# Patient Record
Sex: Female | Born: 1955 | Hispanic: Yes | Marital: Married | Smoking: Never smoker
Health system: Southern US, Community
[De-identification: ages and names within clinical notes are randomized; demographics above are authoritative.]

## PROBLEM LIST (undated history)

## (undated) DIAGNOSIS — E119 Type 2 diabetes mellitus without complications: Secondary | ICD-10-CM

## (undated) DIAGNOSIS — M1990 Unspecified osteoarthritis, unspecified site: Secondary | ICD-10-CM

## (undated) DIAGNOSIS — I10 Essential (primary) hypertension: Secondary | ICD-10-CM

## (undated) DIAGNOSIS — I639 Cerebral infarction, unspecified: Secondary | ICD-10-CM

## (undated) HISTORY — PX: ABDOMINAL HYSTERECTOMY: SHX81

## (undated) HISTORY — PX: APPENDECTOMY: SHX54

---

## 2003-12-16 HISTORY — PX: OTHER SURGICAL HISTORY: SHX169

## 2014-01-01 ENCOUNTER — Encounter (HOSPITAL_COMMUNITY): Payer: Self-pay | Admitting: Emergency Medicine

## 2014-01-01 ENCOUNTER — Emergency Department (HOSPITAL_COMMUNITY)
Admission: EM | Admit: 2014-01-01 | Discharge: 2014-01-01 | Disposition: A | Discharge status: Home / Self Care | Payer: Self-pay | Attending: Emergency Medicine | Admitting: Emergency Medicine

## 2014-01-01 DIAGNOSIS — E119 Type 2 diabetes mellitus without complications: Secondary | ICD-10-CM | POA: Insufficient documentation

## 2014-01-01 DIAGNOSIS — Z794 Long term (current) use of insulin: Secondary | ICD-10-CM | POA: Insufficient documentation

## 2014-01-01 DIAGNOSIS — Z79899 Other long term (current) drug therapy: Secondary | ICD-10-CM | POA: Insufficient documentation

## 2014-01-01 DIAGNOSIS — I10 Essential (primary) hypertension: Secondary | ICD-10-CM | POA: Insufficient documentation

## 2014-01-01 DIAGNOSIS — Z760 Encounter for issue of repeat prescription: Secondary | ICD-10-CM | POA: Insufficient documentation

## 2014-01-01 HISTORY — DX: Essential (primary) hypertension: I10

## 2014-01-01 HISTORY — DX: Type 2 diabetes mellitus without complications: E11.9

## 2014-01-01 LAB — GLUCOSE, CAPILLARY: Glucose-Capillary: 237 mg/dL — ABNORMAL HIGH (ref 70–99)

## 2014-01-01 MED ORDER — GLIPIZIDE-METFORMIN HCL 5-500 MG PO TABS: 1.0000 | ORAL_TABLET | Freq: Two times a day (BID) | ORAL | Status: DC

## 2014-01-01 NOTE — ED Notes (Signed)
Pt reports that she is here on vacation from [place] and ran out of her diabetic medication. No other complaints at this time. States she just needs a refill.

## 2014-01-01 NOTE — ED Provider Notes (Signed)
CSN: [id number]     Arrival date & time 01/01/14  1342 History  This chart was scribed for non-physician practitioner, Noland Fordyce, PA-C working with No att. providers found by Frederich Balding, ED scribe. This patient was seen in room TR08C/TR08C and the patient's care was started at 3:37 PM.    Chief Complaint  Patient presents with  . Medication Refill   The history is provided by the patient. A language interpreter was used.   HPI Comments: Cheryl Massey is a 58 y.o. female who presents to the Emergency Department for medication refill. She states she is here on vacation from [place] and ran out of her metformin 500 mg/5 mg. Denies any current complaints.   Past Medical History  Diagnosis Date  . Diabetes mellitus without complication   . Hypertension    History reviewed. No pertinent past surgical history. History reviewed. No pertinent family history. History  Substance Use Topics  . Smoking status: Never Smoker   . Smokeless tobacco: Not on file  . Alcohol Use: No   OB History   Grav Para Term Preterm Abortions TAB SAB Ect Mult Living                 Review of Systems  All other systems reviewed and are negative.   Allergies  Review of patient's allergies indicates no known allergies.  Home Medications   Current Outpatient Rx  Name  Route  Sig  Dispense  Refill  . enalapril (VASOTEC) 10 MG tablet   Oral   Take 10 mg by mouth daily.         Marland Kitchen glipiZIDE-metformin (METAGLIP) 5-500 MG per tablet   Oral   Take 1 tablet by mouth 2 (two) times daily before a meal.   30 tablet   0     BP 146/74  Pulse 64  Temp(Src) 98.1 F (36.7 C) (Oral)  Resp 20  Wt 177 lb (80.287 kg)  SpO2 100%  Physical Exam  Nursing note and vitals reviewed. Constitutional: She appears well-developed and well-nourished. No distress.  HENT:  Head: Normocephalic and atraumatic.  Eyes: Conjunctivae are normal. No scleral icterus.  Neck: Normal range of motion.   Cardiovascular: Normal rate.   Pulmonary/Chest: Effort normal. No respiratory distress.  Musculoskeletal: Normal range of motion.  Neurological: She is alert.  Skin: Skin is warm and dry. She is not diaphoretic.  Psychiatric: She has a normal mood and affect. Her behavior is normal.    ED Course  Procedures (including critical care time)  DIAGNOSTIC STUDIES: Oxygen Saturation is 100% on RA, normal by my interpretation.    COORDINATION OF CARE: 3:38 PM-Discussed treatment plan which includes refilling medication with pt at bedside and pt agreed to plan.   Labs Review Labs Reviewed  GLUCOSE, CAPILLARY - Abnormal; Notable for the following:    Glucose-Capillary 237 (*)    All other components within normal limits   Imaging Review No results found.  EKG Interpretation   None       MDM   1. Medication refill     I personally performed the services described in this documentation, which was scribed in my presence. The recorded information has been reviewed and is accurate.   Noland Fordyce, PA-C 01/01/14 [phone]

## 2014-01-01 NOTE — Discharge Instructions (Signed)
Entrega de recetas - Departamento de Emergencias  (Medication Refill, Emergency Department)  Hoy le hemos entregado una receta como cortesía. Para su mejor atención, deberá concurrir al consultorio de su médico de cabecera para obtener nuevas recetas. Ellos tienen los registros y pueden hacer un mejor seguimiento que en el departamento de emergencias.  Aquí sólo se prescribe la cantidad necesaria hasta que consulte a su médico. Este es un modo más costoso.  En el futuro planifique las consultas de modo que no tenga que concurrir al departamento de emergencias por este motivo.  Gracias por su colaboración. Su ayuda nos permite una mejor atención de las emergencias que ingresan a diario a nuestro departamento-  Document Released: 03/09/2008 Document Revised: 02/23/2012  ExitCare® Patient Information ©2014 [employer].

## 2014-01-02 NOTE — ED Provider Notes (Signed)
Medical screening examination/treatment/procedure(s) were performed by non-physician practitioner and as supervising physician I was immediately available for consultation/collaboration.  EKG Interpretation   None         Alfonzo Feller, DO 01/02/14 1144

## 2016-10-27 ENCOUNTER — Inpatient Hospital Stay (HOSPITAL_COMMUNITY)
Admission: AD | Admit: 2016-10-27 | Discharge: 2016-10-27 | Disposition: A | Discharge status: Home / Self Care | Payer: Self-pay | Source: Ambulatory Visit | Attending: Emergency Medicine | Admitting: Emergency Medicine

## 2016-10-27 ENCOUNTER — Encounter (HOSPITAL_COMMUNITY): Payer: Self-pay | Admitting: *Deleted

## 2016-10-27 ENCOUNTER — Inpatient Hospital Stay (HOSPITAL_COMMUNITY): Payer: Self-pay

## 2016-10-27 DIAGNOSIS — E119 Type 2 diabetes mellitus without complications: Secondary | ICD-10-CM | POA: Insufficient documentation

## 2016-10-27 DIAGNOSIS — I10 Essential (primary) hypertension: Secondary | ICD-10-CM | POA: Insufficient documentation

## 2016-10-27 DIAGNOSIS — Z79899 Other long term (current) drug therapy: Secondary | ICD-10-CM | POA: Insufficient documentation

## 2016-10-27 DIAGNOSIS — R0789 Other chest pain: Secondary | ICD-10-CM | POA: Insufficient documentation

## 2016-10-27 DIAGNOSIS — Z7984 Long term (current) use of oral hypoglycemic drugs: Secondary | ICD-10-CM | POA: Insufficient documentation

## 2016-10-27 LAB — COMPREHENSIVE METABOLIC PANEL
ALT: 25 U/L (ref 14–54)
AST: 23 U/L (ref 15–41)
Albumin: 4 g/dL (ref 3.5–5.0)
Alkaline Phosphatase: 65 U/L (ref 38–126)
Anion gap: 8 (ref 5–15)
BUN: 13 mg/dL (ref 6–20)
CHLORIDE: 105 mmol/L (ref 101–111)
CO2: 26 mmol/L (ref 22–32)
Calcium: 9.6 mg/dL (ref 8.9–10.3)
Creatinine, Ser: 0.81 mg/dL (ref 0.44–1.00)
GFR calc Af Amer: >60 mL/min (ref >60)
GFR calc non Af Amer: >60 mL/min (ref >60)
GLUCOSE: 120 mg/dL — AB (ref 65–99)
POTASSIUM: 4.2 mmol/L (ref 3.5–5.1)
Sodium: 139 mmol/L (ref 135–145)
Total Bilirubin: 0.6 mg/dL (ref 0.3–1.2)
Total Protein: 6.7 g/dL (ref 6.5–8.1)

## 2016-10-27 LAB — CBC WITH DIFFERENTIAL/PLATELET
Basophils Absolute: 0 10*3/uL (ref 0.0–0.1)
Basophils Relative: 0 %
Eosinophils Absolute: 0.1 10*3/uL (ref 0.0–0.7)
Eosinophils Relative: 1 %
HCT: 37.2 % (ref 36.0–46.0)
HEMOGLOBIN: 12.6 g/dL (ref 12.0–15.0)
LYMPHS ABS: 2.1 10*3/uL (ref 0.7–4.0)
Lymphocytes Relative: 31 %
MCH: 29.6 pg (ref 26.0–34.0)
MCHC: 33.9 g/dL (ref 30.0–36.0)
MCV: 87.3 fL (ref 78.0–100.0)
MONOS PCT: 7 %
Monocytes Absolute: 0.5 10*3/uL (ref 0.1–1.0)
NEUTROS ABS: 4 10*3/uL (ref 1.7–7.7)
NEUTROS PCT: 61 %
Platelets: 250 10*3/uL (ref 150–400)
RBC: 4.26 MIL/uL (ref 3.87–5.11)
RDW: 12.5 % (ref 11.5–15.5)
WBC: 6.6 10*3/uL (ref 4.0–10.5)

## 2016-10-27 LAB — LIPASE, BLOOD: Lipase: 48 U/L (ref 11–51)

## 2016-10-27 LAB — TROPONIN I: Troponin I: <0.03 ng/mL (ref <0.03)

## 2016-10-27 MED ORDER — GI COCKTAIL ~~LOC~~
30.0000 mL | Freq: Once | ORAL | Status: AC
  Administered 2016-10-27: 30 mL via ORAL
  Filled 2016-10-27: qty 30

## 2016-10-27 MED ORDER — NITROGLYCERIN 0.4 MG SL SUBL: 0.4000 mg | SUBLINGUAL_TABLET | SUBLINGUAL | Status: DC | PRN

## 2016-10-27 MED ORDER — OMEPRAZOLE 20 MG PO CPDR: 20.0000 mg | DELAYED_RELEASE_CAPSULE | Freq: Every day | ORAL | 0 refills | Status: DC

## 2016-10-27 MED ORDER — SODIUM CHLORIDE 0.9 % IV SOLN
999.0000 mL | INTRAVENOUS | Status: DC
  Administered 2016-10-27: 1000 mL via INTRAVENOUS

## 2016-10-27 MED ORDER — CLOPIDOGREL BISULFATE 75 MG PO TABS: 300.0000 mg | ORAL_TABLET | Freq: Once | ORAL | Status: DC

## 2016-10-27 MED ORDER — ASPIRIN 81 MG PO CHEW
324.0000 mg | CHEWABLE_TABLET | Freq: Once | ORAL | Status: AC
  Administered 2016-10-27: 324 mg via ORAL
  Filled 2016-10-27: qty 4

## 2016-10-27 NOTE — MAU Note (Signed)
EKG in progress

## 2016-10-27 NOTE — ED Provider Notes (Signed)
[facility] DEPT Provider Note   CSN: [id number] Arrival date & time: 10/27/16  1351     History   Chief Complaint Chief Complaint  Patient presents with  . Chest Pain  . Abdominal Pain    HPI Cheryl Massey is a 60 y.o. female.  HPI  Patient presents with concern of epigastric, mid chest pain. Symptoms began 3 days ago, without clear precipitant. Since onset she has had episodic chest pain, with occasional radiation to the left arm. Pain seems inconsistent, but she does state that it is worse with eating. Pain is somewhat better with doubling over, but no other medications or interventions have been attempted. No fever, no vomiting or diarrhea. Patient does complain of nausea. She has history of diabetes, hypertension, take all medication as directed. She has no history of heart disease. She does not smoke, does not drink.   Past Medical History:  Diagnosis Date  . Diabetes mellitus without complication ([place])   . Hypertension     There are no active problems to display for this patient.   Past Surgical History:  Procedure Laterality Date  . ABDOMINAL HYSTERECTOMY    . APPENDECTOMY      OB History    Gravida Para Term Preterm AB Living   1 1       1    SAB TAB Ectopic Multiple Live Births                   Home Medications    Prior to Admission medications   Medication Sig Start Date End Date Taking? Authorizing Provider  enalapril (VASOTEC) 10 MG tablet Take 10 mg by mouth daily.    Historical Provider, MD  glipiZIDE-metformin (METAGLIP) 5-500 MG per tablet Take 1 tablet by mouth 2 (two) times daily before a meal. 01/01/14   Noland Fordyce, PA-C    Family History History reviewed. No pertinent family history.  Social History Social History  Substance Use Topics  . Smoking status: Never Smoker  . Smokeless tobacco: Never Used  . Alcohol use No     Allergies   Patient has no known allergies.   Review of Systems Review  of Systems  Constitutional:       Per HPI, otherwise negative  HENT:       Per HPI, otherwise negative  Respiratory:       Per HPI, otherwise negative  Cardiovascular:       Per HPI, otherwise negative  Gastrointestinal: Positive for nausea. Negative for vomiting.  Endocrine:       Negative aside from HPI  Genitourinary:       Neg aside from HPI   Musculoskeletal:       Per HPI, otherwise negative  Skin: Negative.   Neurological: Negative for syncope.     Physical Exam Updated Vital Signs BP 167/82 (BP Location: Right Arm)   Pulse (!) 54   Temp 98.1 F (36.7 C) (Oral)   Resp 13   Wt 162 lb (73.5 kg)   SpO2 100%   Physical Exam  Constitutional: She is oriented to person, place, and time. She appears well-developed and well-nourished. No distress.  HENT:  Head: Normocephalic and atraumatic.  Eyes: Conjunctivae and EOM are normal.  Cardiovascular: Normal rate and regular rhythm.   Pulmonary/Chest: Effort normal and breath sounds normal. No stridor. No respiratory distress.  Abdominal: She exhibits no distension.  No ttp, no guarding  Musculoskeletal: She exhibits no edema.  Neurological: She is alert  and oriented to person, place, and time. No cranial nerve deficit.  Skin: Skin is warm and dry.  Psychiatric: She has a normal mood and affect.  Nursing note and vitals reviewed.    ED Treatments / Results  Labs (all labs ordered are listed, but only abnormal results are displayed) Labs Reviewed  COMPREHENSIVE METABOLIC PANEL  LIPASE, BLOOD  TROPONIN I  CBC WITH DIFFERENTIAL/PLATELET    EKG: SR, rate 56, minimal artefact, otherwise normal.  Radiology No results found.  Procedures Procedures (including critical care time)  Medications Ordered in ED Medications  nitroGLYCERIN (NITROSTAT) SL tablet 0.4 mg (not administered)  0.9 %  sodium chloride infusion (1,000 mLs Intravenous New Bag/Given 10/27/16 1605)  gi cocktail (Maalox,Lidocaine,Donnatal) (not  administered)  aspirin chewable tablet 324 mg (324 mg Oral Given 10/27/16 1516)     Initial Impression / Assessment and Plan / ED Course  I have reviewed the triage vital signs and the nursing notes.  Pertinent labs & imaging results that were available during my care of the patient were reviewed by me and considered in my medical decision making (see chart for details).  Clinical Course    6:09 PM Patient awake, alert, no complaints, aware of all findings. We discussed the importance of taking the medication as prescribed, following up with her physician within one week.   Final Clinical Impressions(s) / ED Diagnoses  Patient presents with chest pain. Patient was transferred from our affiliated [facility], due to these concerns. Here she is awake, alert, in no distress. Patient has 3 days of intermittent chest pain, seemingly worse after eating, and given the reassuring lab results, unremarkable EKG, troponin, there is low suspicion for occult ACS or other acute life-threatening pathology. Given the description of pain associated with eating, the patient was started on medication for gastric etiology, discharged to follow-up with primary care.    Carmin Muskrat, MD 10/27/16 [phone]

## 2016-10-27 NOTE — MAU Note (Signed)
[facility] to transport this pt. To [facility]. 67yr. Old chest pain.

## 2016-10-27 NOTE — MAU Note (Signed)
Patient transferred to [facility] via EMS in stable condition.

## 2016-10-27 NOTE — ED Notes (Signed)
Lab at bedside

## 2016-10-27 NOTE — MAU Note (Signed)
Pt C/O upper abd pain, also L side chest pain that goes down her L arm x 3 days.  Is very nauseated after eating, has not been vomiting.

## 2016-10-27 NOTE — ED Notes (Signed)
Pt here from [facility] hospital because pt went to hospital for chest pain and pt is not pregnant.

## 2016-10-27 NOTE — MAU Provider Note (Signed)
History     CSN: [id number]  Arrival date and time: 10/27/16 1351   First Provider Initiated Contact with Patient 10/27/16 1450      Chief Complaint  Patient presents with  . Chest Pain  . Abdominal Pain   Cheryl Massey is a 60 y.o. Female who presents with epigastric pain & chest pain that radiates down left arm.    Chest Pain   This is a new problem. Episode onset: x 3 days. The problem occurs 2 to 4 times per day. The problem has been gradually worsening. Pain location: epigastric, left pectoral area  The pain is at a severity of 7/10. The quality of the pain is described as burning and dull. The pain radiates to the left arm. Associated symptoms include nausea. Pertinent negatives include no abdominal pain, diaphoresis, dizziness, irregular heartbeat, palpitations, shortness of breath, vomiting or weakness. The pain is aggravated by food. She has tried nothing for the symptoms. Risk factors include obesity.  Her past medical history is significant for diabetes and hypertension.  Pertinent negatives for past medical history include no CAD, no CHF and no MI.  Pertinent negatives for family medical history include: no sudden death. Prior diagnostic workup includes echocardiogram.   Spanish interpreter at bedside.     Past Medical History:  Diagnosis Date  . Diabetes mellitus without complication ([place])   . Hypertension     No past surgical history on file.  No family history on file.  Social History  Substance Use Topics  . Smoking status: Never Smoker  . Smokeless tobacco: Not on file  . Alcohol use No    Allergies: No Known Allergies  Prescriptions Prior to Admission  Medication Sig Dispense Refill Last Dose  . enalapril (VASOTEC) 10 MG tablet Take 10 mg by mouth daily.   01/01/2014 at Unknown time  . glipiZIDE-metformin (METAGLIP) 5-500 MG per tablet Take 1 tablet by mouth 2 (two) times daily before a meal. 30 tablet 0     Review of Systems   Constitutional: Negative for diaphoresis.  Respiratory: Negative for shortness of breath.   Cardiovascular: Positive for chest pain. Negative for palpitations.  Gastrointestinal: Positive for heartburn and nausea. Negative for abdominal pain and vomiting.  Neurological: Negative for dizziness and weakness.   Physical Exam   Blood pressure 157/73, pulse (!) 55, temperature 98.1 F (36.7 C), temperature source Oral, resp. rate 18, weight 162 lb (73.5 kg), SpO2 100 %.  Physical Exam  Nursing note and vitals reviewed. Constitutional: She is oriented to person, place, and time. She appears well-developed and well-nourished. No distress.  HENT:  Head: Normocephalic and atraumatic.  Eyes: Conjunctivae are normal. Right eye exhibits no discharge. Left eye exhibits no discharge. No scleral icterus.  Neck: Normal range of motion.  Cardiovascular: Regular rhythm and normal heart sounds.  Bradycardia present.   No murmur heard. Respiratory: Effort normal and breath sounds normal. No respiratory distress. She has no wheezes. She exhibits no tenderness.  GI: There is no tenderness.  Neurological: She is alert and oriented to person, place, and time.  Skin: Skin is warm and dry. She is not diaphoretic.  Psychiatric: She has a normal mood and affect. Her behavior is normal. Judgment and thought content normal.    MAU Course  Procedures No results found for this or any previous visit (from the past 24 hour(s)).  MDM EKG -- sinus bradycardia CP protocol initiated S/w Dr. Dolly Rias at [facility] who accepted transfer of patient  Aspirin 324 mg   Assessment and Plan  A: 1. Chest pain of uncertain etiology    P: Transfer to [facility] via [facility] 10/27/2016, 2:50 PM

## 2016-10-27 NOTE — ED Notes (Signed)
ED Provider at bedside. 

## 2016-10-27 NOTE — MAU Note (Signed)
Patient signed consent on paper.

## 2016-10-27 NOTE — ED Notes (Signed)
[facility] reporting epigastric pain that is somewhat worse when eating but not drinking. Pt denies diarrhea and fevers or being around any other sick contacts. Pt also reports intermittent left arm pain as well. Denies sob. resp e/u, nad.

## 2016-10-27 NOTE — MAU Note (Addendum)
Called [facility] to transport a 28yr. old pt. to [facility] and was told it would be 30mins /hr.

## 2018-01-17 ENCOUNTER — Encounter (HOSPITAL_COMMUNITY): Payer: Self-pay

## 2018-01-17 ENCOUNTER — Emergency Department (HOSPITAL_COMMUNITY): Payer: Self-pay

## 2018-01-17 ENCOUNTER — Emergency Department (HOSPITAL_COMMUNITY)
Admission: EM | Admit: 2018-01-17 | Discharge: 2018-01-17 | Disposition: A | Discharge status: Home / Self Care | Payer: Self-pay | Attending: Emergency Medicine | Admitting: Emergency Medicine

## 2018-01-17 DIAGNOSIS — R6889 Other general symptoms and signs: Secondary | ICD-10-CM | POA: Insufficient documentation

## 2018-01-17 DIAGNOSIS — E119 Type 2 diabetes mellitus without complications: Secondary | ICD-10-CM | POA: Insufficient documentation

## 2018-01-17 DIAGNOSIS — Z79899 Other long term (current) drug therapy: Secondary | ICD-10-CM | POA: Insufficient documentation

## 2018-01-17 DIAGNOSIS — I10 Essential (primary) hypertension: Secondary | ICD-10-CM | POA: Insufficient documentation

## 2018-01-17 LAB — CBG MONITORING, ED: Glucose-Capillary: 378 mg/dL — ABNORMAL HIGH (ref 65–99)

## 2018-01-17 LAB — INFLUENZA PANEL BY PCR (TYPE A & B)
INFLBPCR: NEGATIVE
Influenza A By PCR: POSITIVE — AB

## 2018-01-17 MED ORDER — GUAIFENESIN-CODEINE 100-10 MG/5ML PO SYRP: 5.0000 mL | ORAL_SOLUTION | Freq: Three times a day (TID) | ORAL | 0 refills | Status: DC | PRN

## 2018-01-17 NOTE — ED Provider Notes (Signed)
[facility] EMERGENCY DEPARTMENT Provider Note   CSN: [id number] Arrival date & time: 01/17/18  1245     History   Chief Complaint No chief complaint on file.   HPI [place] Cheryl Massey is a 62 y.o. female.  HPI   Patient is a 62 year old female with a history of hypertension and non-insulin-dependent diabetes mellitus who presents to the emergency department for flu test.  States she has a family member who is pregnant and was told by [facility] to be tested for the flu given her recent upper respiratory tract symptoms.  She states that for the past week she has had productive cough with white phlegm.  She also reports congestion and rhinorrhea.  Reports body aches in bilateral arms and legs.  Also endorses bitemporal aching headache.  She has tried taking over-the-counter TheraFlu for her symptoms with some relief.  She has several family members with similar symptoms.  Denies fever/chills, ear pain, sore throat, neck pain, chest pain, shortness of breath, abdominal pain, nausea/vomiting.  States that she has not been checking her blood sugar at home given her recent illness. Denies polydipsia, polyuria, fatigue.   Past Medical History:  Diagnosis Date  . Diabetes mellitus without complication ([place])   . Hypertension     There are no active problems to display for this patient.   Past Surgical History:  Procedure Laterality Date  . ABDOMINAL HYSTERECTOMY    . APPENDECTOMY      OB History    Gravida Para Term Preterm AB Living   1 1       1    SAB TAB Ectopic Multiple Live Births                   Home Medications    Prior to Admission medications   Medication Sig Start Date End Date Taking? Authorizing Provider  enalapril (VASOTEC) 10 MG tablet Take 10 mg by mouth daily.    [provider]  glipiZIDE-metformin (METAGLIP) 5-500 MG per tablet Take 1 tablet by mouth 2 (two) times daily before a meal. 01/01/14   Gerarda Fraction, Bronwen Betters,  PA-C  omeprazole (PRILOSEC) 20 MG capsule Take 1 capsule (20 mg total) by mouth daily. Take one tablet daily 10/27/16   Carmin Muskrat, MD    Family History No family history on file.  Social History Social History   Tobacco Use  . Smoking status: Never Smoker  . Smokeless tobacco: Never Used  Substance Use Topics  . Alcohol use: No  . Drug use: No     Allergies   Patient has no known allergies.   Review of Systems Review of Systems  Constitutional: Negative for chills, fatigue and fever.  HENT: Positive for congestion and rhinorrhea. Negative for ear pain, sore throat and trouble swallowing.   Respiratory: Positive for cough. Negative for shortness of breath.   Cardiovascular: Negative for chest pain.  Gastrointestinal: Negative for abdominal pain, nausea and vomiting.  Endocrine: Negative for polydipsia and polyuria.  Musculoskeletal: Positive for myalgias (total body aches). Negative for gait problem, neck pain and neck stiffness.  Skin: Negative for rash.  Neurological: Positive for headaches. Negative for weakness and numbness.  Psychiatric/Behavioral: Negative for agitation and confusion.     Physical Exam Updated Vital Signs BP (!) 147/81 (BP Location: Right Arm)   Pulse 85   Temp 98 F (36.7 C) (Oral)   Resp 12   SpO2 98%   Physical Exam  Constitutional: She appears  well-developed and well-nourished. No distress.  HENT:  Head: Normocephalic and atraumatic.  Mouth/Throat: Oropharynx is clear and moist. No oropharyngeal exudate.  Bilateral TMs with good cone of light.  Mucous membranes moist.  Eyes: Conjunctivae are normal. Pupils are equal, round, and reactive to light. Right eye exhibits no discharge. Left eye exhibits no discharge.  Neck: Normal range of motion. Neck supple.  Cardiovascular: Normal rate, regular rhythm and intact distal pulses. Exam reveals no friction rub.  No murmur heard. Pulmonary/Chest: Effort normal. No stridor. No respiratory  distress. She has no wheezes. She has no rales.  Abdominal: Soft. Bowel sounds are normal. There is no tenderness. There is no guarding.  Lymphadenopathy:    She has no cervical adenopathy.  Neurological: She is alert. Coordination normal.  Skin: Skin is warm and dry. She is not diaphoretic.  Psychiatric: She has a normal mood and affect. Her behavior is normal.  Nursing note and vitals reviewed.    ED Treatments / Results  Labs (all labs ordered are listed, but only abnormal results are displayed) Labs Reviewed  CBG MONITORING, ED - Abnormal; Notable for the following components:      Result Value   Glucose-Capillary 378 (*)    All other components within normal limits  INFLUENZA PANEL BY PCR (TYPE A & B)    EKG  EKG Interpretation None       Radiology Dg Chest 2 View  Result Date: 01/17/2018 CLINICAL DATA:  Cough and fever for 4 days. EXAM: CHEST  2 VIEW COMPARISON:  10/27/2016 FINDINGS: Cardiomediastinal silhouette is normal. Mediastinal contours appear intact. There is no evidence of focal airspace consolidation, pleural effusion or pneumothorax. Osseous structures are without acute abnormality. Soft tissues are grossly normal. IMPRESSION: No active cardiopulmonary disease. Electronically Signed   By: Fidela Salisbury M.D.   On: 01/17/2018 13:38    Procedures Procedures (including critical care time)  Medications Ordered in ED Medications - No data to display   Initial Impression / Assessment and Plan / ED Course  I have reviewed the triage vital signs and the nursing notes.  Pertinent labs & imaging results that were available during my care of the patient were reviewed by me and considered in my medical decision making (see chart for details).    Patient presents for flu test given she has a family member who is pregnant and she has had recent URI symptoms.  Her blood glucose is elevated, likely related to her recent illness. Counseled her to drink plenty of  fluids, continue taking her home glipizide-metformin and have this rechecked and managed with her PCP. Chest xray negative for pneumonia or acute abnormality. Vitals are stable, low-grade fever. No signs of dehydration, tolerating PO's. Lungs are clear. The patient understands that symptoms are greater than the recommended 24-48 hour window of Tamiflu treatment. Will proceed with influenza testing given [facility] has asked this to be done. Patient will be discharged with instructions to orally hydrate, rest, and use over-the-counter medications such as anti-inflammatories ibuprofen and Aleve for muscle aches and Tylenol for fever. Pt will be discharged with cough suppressant.  Discussed return precautions with patient and she agrees and voiced understanding at bedside.  Final Clinical Impressions(s) / ED Diagnoses   Final diagnoses:  Flu-like symptoms    ED Discharge Orders        Ordered    guaiFENesin-codeine (ROBITUSSIN AC) 100-10 MG/5ML syrup  3 times daily PRN     01/17/18 1553  Glyn Ade, PA-C 01/17/18 1555    Tanna Furry, MD 01/18/18 2157

## 2018-01-17 NOTE — Discharge Instructions (Signed)
You will receive a phone call if your test for flu is positive.  Your blood sugar is high.  Please check at home and follow-up with your regular doctor for further management.

## 2018-01-17 NOTE — ED Triage Notes (Signed)
Per interpretor services patient here with cold symptoms, cough and fever x 4-5 days. Patient speaking complete symptoms, NAD

## 2018-01-17 NOTE — ED Notes (Signed)
Called patient to be roomed, no answer, patient still at [employer]

## 2018-01-17 NOTE — ED Notes (Signed)
Declined W/C at D/C and was escorted to lobby by RN. 

## 2018-01-17 NOTE — ED Triage Notes (Signed)
PT reports entire family has been sent to ED by a Doctor at [facility] hospital because her Granddaughter is pregnant.

## 2018-09-13 ENCOUNTER — Ambulatory Visit: Payer: Self-pay

## 2019-03-28 ENCOUNTER — Emergency Department (HOSPITAL_COMMUNITY): Payer: Medicaid Other

## 2019-03-28 ENCOUNTER — Observation Stay (HOSPITAL_BASED_OUTPATIENT_CLINIC_OR_DEPARTMENT_OTHER): Payer: Medicaid Other

## 2019-03-28 ENCOUNTER — Observation Stay (HOSPITAL_COMMUNITY): Payer: Medicaid Other

## 2019-03-28 ENCOUNTER — Other Ambulatory Visit: Payer: Self-pay

## 2019-03-28 ENCOUNTER — Encounter (HOSPITAL_COMMUNITY): Payer: Self-pay | Admitting: Emergency Medicine

## 2019-03-28 ENCOUNTER — Inpatient Hospital Stay (HOSPITAL_COMMUNITY)
Admission: EM | Admit: 2019-03-28 | Discharge: 2019-03-29 | DRG: 066 | Disposition: A | Discharge status: Home / Self Care | Payer: Medicaid Other | Attending: Internal Medicine | Admitting: Internal Medicine

## 2019-03-28 DIAGNOSIS — G8929 Other chronic pain: Secondary | ICD-10-CM | POA: Diagnosis present

## 2019-03-28 DIAGNOSIS — I63512 Cerebral infarction due to unspecified occlusion or stenosis of left middle cerebral artery: Secondary | ICD-10-CM | POA: Diagnosis not present

## 2019-03-28 DIAGNOSIS — G459 Transient cerebral ischemic attack, unspecified: Secondary | ICD-10-CM | POA: Diagnosis present

## 2019-03-28 DIAGNOSIS — I10 Essential (primary) hypertension: Secondary | ICD-10-CM | POA: Diagnosis not present

## 2019-03-28 DIAGNOSIS — Z7984 Long term (current) use of oral hypoglycemic drugs: Secondary | ICD-10-CM

## 2019-03-28 DIAGNOSIS — Z6828 Body mass index (BMI) 28.0-28.9, adult: Secondary | ICD-10-CM

## 2019-03-28 DIAGNOSIS — E119 Type 2 diabetes mellitus without complications: Secondary | ICD-10-CM | POA: Diagnosis not present

## 2019-03-28 DIAGNOSIS — Z79899 Other long term (current) drug therapy: Secondary | ICD-10-CM

## 2019-03-28 DIAGNOSIS — D352 Benign neoplasm of pituitary gland: Secondary | ICD-10-CM | POA: Diagnosis not present

## 2019-03-28 DIAGNOSIS — E663 Overweight: Secondary | ICD-10-CM | POA: Diagnosis present

## 2019-03-28 DIAGNOSIS — R29700 NIHSS score 0: Secondary | ICD-10-CM | POA: Diagnosis present

## 2019-03-28 DIAGNOSIS — M25551 Pain in right hip: Secondary | ICD-10-CM | POA: Diagnosis not present

## 2019-03-28 DIAGNOSIS — I639 Cerebral infarction, unspecified: Secondary | ICD-10-CM

## 2019-03-28 DIAGNOSIS — Z8249 Family history of ischemic heart disease and other diseases of the circulatory system: Secondary | ICD-10-CM

## 2019-03-28 DIAGNOSIS — Z90710 Acquired absence of both cervix and uterus: Secondary | ICD-10-CM

## 2019-03-28 DIAGNOSIS — E785 Hyperlipidemia, unspecified: Secondary | ICD-10-CM | POA: Diagnosis present

## 2019-03-28 HISTORY — DX: Cerebral infarction, unspecified: I63.9

## 2019-03-28 LAB — CBC
HCT: 39.8 % (ref 36.0–46.0)
Hemoglobin: 13 g/dL (ref 12.0–15.0)
MCH: 28.9 pg (ref 26.0–34.0)
MCHC: 32.7 g/dL (ref 30.0–36.0)
MCV: 88.4 fL (ref 80.0–100.0)
Platelets: 293 10*3/uL (ref 150–400)
RBC: 4.5 MIL/uL (ref 3.87–5.11)
RDW: 12.4 % (ref 11.5–15.5)
WBC: 5.9 10*3/uL (ref 4.0–10.5)
nRBC: 0 % (ref 0.0–0.2)

## 2019-03-28 LAB — RAPID URINE DRUG SCREEN, HOSP PERFORMED
Amphetamines: NOT DETECTED
Barbiturates: NOT DETECTED
Benzodiazepines: NOT DETECTED
Cocaine: NOT DETECTED
Opiates: NOT DETECTED
Tetrahydrocannabinol: NOT DETECTED

## 2019-03-28 LAB — COMPREHENSIVE METABOLIC PANEL
ALT: 22 U/L (ref 0–44)
AST: 22 U/L (ref 15–41)
Albumin: 4 g/dL (ref 3.5–5.0)
Alkaline Phosphatase: 71 U/L (ref 38–126)
Anion gap: 12 (ref 5–15)
BUN: 12 mg/dL (ref 8–23)
CO2: 24 mmol/L (ref 22–32)
Calcium: 9.5 mg/dL (ref 8.9–10.3)
Chloride: 103 mmol/L (ref 98–111)
Creatinine, Ser: 0.81 mg/dL (ref 0.44–1.00)
GFR calc Af Amer: >60 mL/min (ref >60)
GFR calc non Af Amer: >60 mL/min (ref >60)
Glucose, Bld: 215 mg/dL — ABNORMAL HIGH (ref 70–99)
Potassium: 3.8 mmol/L (ref 3.5–5.1)
Sodium: 139 mmol/L (ref 135–145)
Total Bilirubin: 0.5 mg/dL (ref 0.3–1.2)
Total Protein: 7 g/dL (ref 6.5–8.1)

## 2019-03-28 LAB — URINALYSIS, ROUTINE W REFLEX MICROSCOPIC
Bilirubin Urine: NEGATIVE
Glucose, UA: 50 mg/dL — AB
Hgb urine dipstick: NEGATIVE
Ketones, ur: NEGATIVE mg/dL
Leukocytes,Ua: NEGATIVE
Nitrite: NEGATIVE
Protein, ur: NEGATIVE mg/dL
Specific Gravity, Urine: 1.024 (ref 1.005–1.030)
pH: 6 (ref 5.0–8.0)

## 2019-03-28 LAB — DIFFERENTIAL
Abs Immature Granulocytes: 0.03 10*3/uL (ref 0.00–0.07)
Basophils Absolute: 0.1 10*3/uL (ref 0.0–0.1)
Basophils Relative: 1 %
Eosinophils Absolute: 0.1 10*3/uL (ref 0.0–0.5)
Eosinophils Relative: 2 %
Immature Granulocytes: 1 %
Lymphocytes Relative: 31 %
Lymphs Abs: 1.8 10*3/uL (ref 0.7–4.0)
Monocytes Absolute: 0.5 10*3/uL (ref 0.1–1.0)
Monocytes Relative: 9 %
Neutro Abs: 3.3 10*3/uL (ref 1.7–7.7)
Neutrophils Relative %: 56 %

## 2019-03-28 LAB — APTT: aPTT: 27 seconds (ref 24–36)

## 2019-03-28 LAB — GLUCOSE, CAPILLARY
Glucose-Capillary: 91 mg/dL (ref 70–99)
Glucose-Capillary: 245 mg/dL — ABNORMAL HIGH (ref 70–99)

## 2019-03-28 LAB — I-STAT CREATININE, ED: Creatinine, Ser: 0.8 mg/dL (ref 0.44–1.00)

## 2019-03-28 LAB — PROTIME-INR
INR: 1 (ref 0.8–1.2)
Prothrombin Time: 12.8 seconds (ref 11.4–15.2)

## 2019-03-28 LAB — ECHOCARDIOGRAM LIMITED
Height: 63 in
Weight: 2567.92 oz

## 2019-03-28 LAB — ETHANOL: Alcohol, Ethyl (B): <10 mg/dL (ref <10)

## 2019-03-28 MED ORDER — ACETAMINOPHEN 160 MG/5ML PO SOLN: 650.0000 mg | ORAL | Status: DC | PRN

## 2019-03-28 MED ORDER — ATORVASTATIN CALCIUM 40 MG PO TABS
40.0000 mg | ORAL_TABLET | Freq: Every day | ORAL | Status: DC
  Administered 2019-03-28 – 2019-03-29 (×2): 40 mg via ORAL
  Filled 2019-03-28 (×2): qty 1

## 2019-03-28 MED ORDER — INSULIN ASPART 100 UNIT/ML ~~LOC~~ SOLN
0.0000 [IU] | Freq: Every day | SUBCUTANEOUS | Status: DC
  Administered 2019-03-28: 2 [IU] via SUBCUTANEOUS

## 2019-03-28 MED ORDER — ASPIRIN 300 MG RE SUPP: 300.0000 mg | Freq: Every day | RECTAL | Status: DC

## 2019-03-28 MED ORDER — SODIUM CHLORIDE 0.9 % IV SOLN
INTRAVENOUS | Status: DC
  Administered 2019-03-28: 18:00:00 via INTRAVENOUS

## 2019-03-28 MED ORDER — GLUCERNA SHAKE PO LIQD
237.0000 mL | Freq: Two times a day (BID) | ORAL | Status: DC
  Administered 2019-03-29 (×2): 237 mL via ORAL

## 2019-03-28 MED ORDER — LORAZEPAM 2 MG/ML IJ SOLN: 0.5000 mg | Freq: Once | INTRAMUSCULAR | Status: DC

## 2019-03-28 MED ORDER — STROKE: EARLY STAGES OF RECOVERY BOOK
Freq: Once | Status: AC
  Administered 2019-03-28: 18:00:00

## 2019-03-28 MED ORDER — INSULIN ASPART 100 UNIT/ML ~~LOC~~ SOLN
0.0000 [IU] | Freq: Three times a day (TID) | SUBCUTANEOUS | Status: DC
  Administered 2019-03-29: 2 [IU] via SUBCUTANEOUS

## 2019-03-28 MED ORDER — SENNOSIDES-DOCUSATE SODIUM 8.6-50 MG PO TABS: 1.0000 | ORAL_TABLET | Freq: Every evening | ORAL | Status: DC | PRN

## 2019-03-28 MED ORDER — ACETAMINOPHEN 650 MG RE SUPP: 650.0000 mg | RECTAL | Status: DC | PRN

## 2019-03-28 MED ORDER — ENOXAPARIN SODIUM 40 MG/0.4ML ~~LOC~~ SOLN
40.0000 mg | SUBCUTANEOUS | Status: DC
  Administered 2019-03-28: 40 mg via SUBCUTANEOUS
  Filled 2019-03-28: qty 0.4

## 2019-03-28 MED ORDER — ASPIRIN 325 MG PO TABS
325.0000 mg | ORAL_TABLET | Freq: Every day | ORAL | Status: DC
  Administered 2019-03-28 – 2019-03-29 (×2): 325 mg via ORAL
  Filled 2019-03-28 (×2): qty 1

## 2019-03-28 MED ORDER — IOHEXOL 350 MG/ML SOLN
50.0000 mL | Freq: Once | INTRAVENOUS | Status: AC | PRN
  Administered 2019-03-28: 50 mL via INTRAVENOUS

## 2019-03-28 MED ORDER — CLOPIDOGREL BISULFATE 75 MG PO TABS
300.0000 mg | ORAL_TABLET | Freq: Once | ORAL | Status: AC
  Administered 2019-03-29: 300 mg via ORAL
  Filled 2019-03-28: qty 4

## 2019-03-28 MED ORDER — ACETAMINOPHEN 325 MG PO TABS: 650.0000 mg | ORAL_TABLET | ORAL | Status: DC | PRN

## 2019-03-28 MED ORDER — CLOPIDOGREL BISULFATE 75 MG PO TABS
75.0000 mg | ORAL_TABLET | Freq: Every day | ORAL | Status: DC
  Administered 2019-03-29: 75 mg via ORAL
  Filled 2019-03-28: qty 1

## 2019-03-28 MED ORDER — GADOBUTROL 1 MMOL/ML IV SOLN
7.0000 mL | Freq: Once | INTRAVENOUS | Status: AC | PRN
  Administered 2019-03-28: 7 mL via INTRAVENOUS

## 2019-03-28 NOTE — Consult Note (Signed)
Neurology Consultation  Reason for Consult: Code stroke Referring Physician: Lita Mains  CC: Right-sided weakness  History is obtained from: Patient  HPI: Cheryl Massey is a 63 y.o. female with history of diabetes and hypertension.  Patient states that she got up this morning feeling fine.  She went to make a cup of coffee at approximately 9:30 AM when she suddenly felt weak on her right arm and her right leg.  She states that she had no visual difficulties, speech difficulties, sensation abnormalities.  By the time she came to the hospital at 11:34 AM her symptoms had fully resolved.  Patient was called as a code stroke and brought to CT scan which showed no acute stroke and no source of emboli.  Patient does speak Spanish however there was a interpreter in the room to give a clear history.  LKW: 9:30 AM tpa given?: no, times resolved Premorbid modified Rankin scale (mRS): 0 NIH stroke scale of 0 ABCD2 of 7 ROS: A 14 point ROS was performed and is negative except as noted in the HPI.   Past Medical History:  Diagnosis Date  . Diabetes mellitus without complication ([place])   . Hypertension     Family History  Problem Relation Age of Onset  . Hypertension Mother   . Hypertension Father      Social History:   reports that she has never smoked. She has never used smokeless tobacco. She reports that she does not drink alcohol or use drugs.  Medications No current facility-administered medications for this encounter.   Current Outpatient Medications:  .  enalapril (VASOTEC) 10 MG tablet, Take 10 mg by mouth daily., Disp: , Rfl:  .  glipiZIDE-metformin (METAGLIP) 5-500 MG per tablet, Take 1 tablet by mouth 2 (two) times daily before a meal., Disp: 30 tablet, Rfl: 0 .  guaiFENesin-codeine (ROBITUSSIN AC) 100-10 MG/5ML syrup, Take 5 mLs by mouth 3 (three) times daily as needed for cough., Disp: 120 mL, Rfl: 0 .  omeprazole (PRILOSEC) 20 MG capsule, Take 1 capsule (20  mg total) by mouth daily. Take one tablet daily, Disp: 21 capsule, Rfl: 0   Exam: Current vital signs: BP (!) 150/77   Pulse 62   Temp 98.4 F (36.9 C) (Oral)   Resp 15   Ht 5\' 3"  (1.6 m)   Wt 72.8 kg   SpO2 100%   BMI 28.43 kg/m  Vital signs in last 24 hours: Temp:  [98.4 F (36.9 C)] 98.4 F (36.9 C) (04/13 1136) Pulse Rate:  [62-74] 62 (04/13 1208) Resp:  [15] 15 (04/13 1208) BP: (150-182)/(77-82) 150/77 (04/13 1208) SpO2:  [98 %-100 %] 100 % (04/13 1208) Weight:  [72.8 kg] 72.8 kg (04/13 1136)  Physical Exam  Constitutional: Appears well-developed and well-nourished.  Psych: Affect appropriate to situation Eyes: No scleral injection HENT: No OP obstrucion Head: Normocephalic.  Cardiovascular: Normal rate and regular rhythm.  Respiratory: Effort normal, non-labored breathing GI: Soft.  No distension. There is no tenderness.  Skin: WDI  Neuro: Mental Status: Patient is awake, alert, oriented to person, place, month, year, and situation. Patient is able to give a clear and coherent history. No signs of aphasia or neglect Cranial Nerves: II: Visual Fields are full.  III,IV, VI: EOMI without ptosis or diploplia. Pupils are equal, round, and reactive to light.   V: Facial sensation is symmetric to temperature VII: Facial movement is symmetric.  VIII: hearing is intact to voice X: Uvula elevates symmetrically XI: Shoulder shrug  is symmetric. XII: tongue is midline without atrophy or fasciculations.  Motor: Tone is normal. Bulk is normal. 5/5 strength was present in all four extremities.  Sensory: Sensation is symmetric to light touch and temperature in the arms and legs. Deep Tendon Reflexes: 2+ and symmetric in the biceps and patellae.  Plantars: Toes are downgoing bilaterally.  Cerebellar: FNF and HKS are intact bilaterally  Labs I have reviewed labs in epic and the results pertinent to this consultation are:   CBC    Component Value Date/Time   WBC  5.9 03/28/2019 1144   RBC 4.50 03/28/2019 1144   HGB 13.0 03/28/2019 1144   HCT 39.8 03/28/2019 1144   PLT 293 03/28/2019 1144   MCV 88.4 03/28/2019 1144   MCH 28.9 03/28/2019 1144   MCHC 32.7 03/28/2019 1144   RDW 12.4 03/28/2019 1144   LYMPHSABS 1.8 03/28/2019 1144   MONOABS 0.5 03/28/2019 1144   EOSABS 0.1 03/28/2019 1144   BASOSABS 0.1 03/28/2019 1144    CMP     Component Value Date/Time   NA 139 10/27/2016 1627   K 4.2 10/27/2016 1627   CL 105 10/27/2016 1627   CO2 26 10/27/2016 1627   GLUCOSE 120 (H) 10/27/2016 1627   BUN 13 10/27/2016 1627   CREATININE 0.80 03/28/2019 1152   CALCIUM 9.6 10/27/2016 1627   PROT 6.7 10/27/2016 1627   ALBUMIN 4.0 10/27/2016 1627   AST 23 10/27/2016 1627   ALT 25 10/27/2016 1627   ALKPHOS 65 10/27/2016 1627   BILITOT 0.6 10/27/2016 1627   GFRNONAA >60 10/27/2016 1627   GFRAA >60 10/27/2016 1627    Lipid Panel  No results found for: CHOL, TRIG, HDL, CHOLHDL, VLDL, LDLCALC, LDLDIRECT   Imaging I have reviewed the images obtained:  CT-scan of the brain- no acute findings by CT.  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  Patient also showed a sellar/suprasellar mass.  CTA head and neck-showed 70% stenosis of the left M1 segment, extending over the length of 3-4 mm.  This looks like a chronic stenosis rather than evidence of emboli.  Etta Quill PA-C [facility] [phone]  M-F  (9:00 am- 5:00 PM)  03/28/2019, 12:24 PM   I have seen the patient reviewed the above note.  Assessment:  63 year old female with a transient period of right arm and leg weakness with no other strokelike symptoms.  Likely TIA.  Given her ABCD D2 score of 7, she has a significant risk of early recurrent stroke will have patient admitted for further evaluation stroke work-up.   Recommend # MRI of the brain without contrast #Transthoracic Echo,   # Start patient on ASA 325mg   daily,  Plavix 75 mg daily following 3 to milligrams load for 3 weeks #Start or continue Atorvastatin 80 mg/other high intensity statin # HBAIC and Lipid profile # Telemetry monitoring # Frequent neuro checks # NPO until passes stroke swallow screen # please page stroke NP  Or  PA  Or MD from 8am -4 pm  as this patient from this time will be  followed by the stroke.   You can look them up on [email or web address]  Password [email or web address]  Roland Rack, MD [facility] [phone]  If 7pm- 7am, please page neurology on call as listed in Page.

## 2019-03-28 NOTE — ED Notes (Signed)
Pt returned from CT °

## 2019-03-28 NOTE — ED Provider Notes (Signed)
[facility] EMERGENCY DEPARTMENT Provider Note   CSN: [id number] Arrival date & time: 03/28/19  1124    History   Chief Complaint Chief Complaint  Patient presents with   Weakness    HPI Cheryl Massey is a 63 y.o. female.     HPI Patient woke up in her normal state of health this morning.  States at 930 she was drinking her coffee when she developed pins and needle sensation to her tongue and weakness in her right arm.  States the symptoms have since improved.  Denies head trauma, visual changes, speech changes. Past Medical History:  Diagnosis Date   Diabetes mellitus without complication ([place])    Hypertension     There are no active problems to display for this patient.   Past Surgical History:  Procedure Laterality Date   ABDOMINAL HYSTERECTOMY     APPENDECTOMY       OB History    Gravida  1   Para  1   Term      Preterm      AB      Living  1     SAB      TAB      Ectopic      Multiple      Live Births               Home Medications    Prior to Admission medications   Medication Sig Start Date End Date Taking? Authorizing Provider  glipiZIDE-metformin (METAGLIP) 5-500 MG per tablet Take 1 tablet by mouth 2 (two) times daily before a meal. 01/01/14  Yes Phelps, Erin O, PA-C  enalapril (VASOTEC) 10 MG tablet Take 10 mg by mouth daily.    [provider]  guaiFENesin-codeine (ROBITUSSIN AC) 100-10 MG/5ML syrup Take 5 mLs by mouth 3 (three) times daily as needed for cough. Patient not taking: Reported on 03/28/2019 01/17/18   Glyn Ade, PA-C  omeprazole (PRILOSEC) 20 MG capsule Take 1 capsule (20 mg total) by mouth daily. Take one tablet daily Patient not taking: Reported on 03/28/2019 10/27/16   Carmin Muskrat, MD    Family History Family History  Problem Relation Age of Onset   Hypertension Mother    Hypertension Father     Social History Social History   Tobacco Use     Smoking status: Never Smoker   Smokeless tobacco: Never Used  Substance Use Topics   Alcohol use: No   Drug use: No     Allergies   Patient has no known allergies.   Review of Systems Review of Systems  Constitutional: Negative for chills and fever.  HENT: Negative for sore throat and trouble swallowing.   Eyes: Negative for visual disturbance.  Respiratory: Negative for cough and shortness of breath.   Cardiovascular: Negative for chest pain.  Gastrointestinal: Negative for abdominal pain, constipation, diarrhea, nausea and vomiting.  Genitourinary: Negative for dysuria, flank pain and frequency.  Musculoskeletal: Negative for back pain, myalgias and neck pain.  Skin: Negative for rash and wound.  Neurological: Positive for weakness. Negative for dizziness, syncope, speech difficulty, light-headedness and headaches.  All other systems reviewed and are negative.    Physical Exam Updated Vital Signs BP (!) 146/84    Pulse (!) 56    Temp 98.4 F (36.9 C) (Oral)    Resp 19    Ht 5\' 3"  (1.6 m)    Wt 72.8 kg    SpO2 100%  BMI 28.43 kg/m   Physical Exam Vitals signs and nursing note reviewed.  Constitutional:      General: She is not in acute distress.    Appearance: Normal appearance. She is well-developed. She is not ill-appearing.  HENT:     Head: Normocephalic and atraumatic.     Comments: Cranial nerves II through XII grossly intact.    Mouth/Throat:     Mouth: Mucous membranes are moist.  Eyes:     Extraocular Movements: Extraocular movements intact.     Pupils: Pupils are equal, round, and reactive to light.  Neck:     Musculoskeletal: Normal range of motion and neck supple. No neck rigidity or muscular tenderness.  Cardiovascular:     Rate and Rhythm: Normal rate and regular rhythm.     Heart sounds: No murmur. No friction rub. No gallop.   Pulmonary:     Effort: Pulmonary effort is normal. No respiratory distress.     Breath sounds: Normal breath  sounds. No stridor. No wheezing, rhonchi or rales.  Chest:     Chest wall: No tenderness.  Abdominal:     General: Bowel sounds are normal.     Palpations: Abdomen is soft.     Tenderness: There is no abdominal tenderness. There is no guarding or rebound.  Musculoskeletal: Normal range of motion.        General: No swelling, tenderness, deformity or signs of injury.     Right lower leg: No edema.     Left lower leg: No edema.  Lymphadenopathy:     Cervical: No cervical adenopathy.  Skin:    General: Skin is warm and dry.     Findings: No erythema or rash.  Neurological:     Mental Status: She is alert and oriented to person, place, and time.     Comments: 5/5 motor right upper extremity.  Patient has some drift to the right lower extremity.  5/5 motor left upper and left lower extremities.  Sensation light touch fully intact.  Psychiatric:        Behavior: Behavior normal.      ED Treatments / Results  Labs (all labs ordered are listed, but only abnormal results are displayed) Labs Reviewed  COMPREHENSIVE METABOLIC PANEL - Abnormal; Notable for the following components:      Result Value   Glucose, Bld 215 (*)    All other components within normal limits  ETHANOL  PROTIME-INR  APTT  CBC  DIFFERENTIAL  RAPID URINE DRUG SCREEN, HOSP PERFORMED  URINALYSIS, ROUTINE W REFLEX MICROSCOPIC  I-STAT CREATININE, ED    EKG EKG Interpretation  Date/Time:  Monday March 28 2019 11:39:17 EDT Ventricular Rate:  70 PR Interval:    QRS Duration: 82 QT Interval:  404 QTC Calculation: 436 R Axis:   70 Text Interpretation:  Sinus rhythm Confirmed by Julianne Rice [phone]) on 03/28/2019 11:56:04 AM   Radiology Ct Angio Head W Or Wo Contrast  Result Date: 03/28/2019 CLINICAL DATA:  Code stroke. Right arm and leg weakness. Last seen normal 0930 hours EXAM: CT ANGIOGRAPHY HEAD AND NECK TECHNIQUE: Multidetector CT imaging of the head and neck was performed using the standard protocol  during bolus administration of intravenous contrast. Multiplanar CT image reconstructions and MIPs were obtained to evaluate the vascular anatomy. Carotid stenosis measurements (when applicable) are obtained utilizing NASCET criteria, using the distal internal carotid diameter as the denominator. CONTRAST:  69mL OMNIPAQUE IOHEXOL 350 MG/ML SOLN COMPARISON:  CT earlier same day FINDINGS: CTA  NECK FINDINGS Aortic arch: Minimal atherosclerotic plaque. No aneurysm or dissection. Right carotid system: Common carotid artery widely patent to the bifurcation. Carotid bifurcation is normal without soft or calcified plaque. Left carotid system: Left common carotid artery widely patent to the bifurcation. Carotid bifurcation normal without soft or calcified plaque. Vertebral arteries: Both widely patent. Left vertebral artery arises at the arch. Skeleton: Ordinary mild degenerative changes of the facet joints. Other neck: No mass or lymphadenopathy. Upper chest: Negative Review of the MIP images confirms the above findings CTA HEAD FINDINGS Anterior circulation: Both internal carotid arteries are widely patent through the skull base and siphon regions. No siphon stenosis. The anterior and middle cerebral vessels on the right are widely patent and normal. On the left, there is stenosis of the M1 segment estimated at 70%. This extends over a length of about 3-4 mm and is probably a chronic eccentric stenosis. Beyond that, no large or medium vessel occlusion is identified. Posterior circulation: Both vertebral arteries are widely patent to the basilar. No basilar stenosis. Posterior circulation branch vessels are normal. Venous sinuses: Patent and normal. Anatomic variants: None significant. Delayed phase: No abnormal enhancement. 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 I favor that this is a chronic stenosis rather than evidence of  an embolus. No distal vessel occlusion is identified. These results were communicated by text message at the time of interpretation on 03/28/2019 at 12:14 pm to Dr. Roland Rack. Electronically Signed   By: Nelson Chimes M.D.   On: 03/28/2019 12:19   Ct Angio Neck W Or Wo Contrast  Result Date: 03/28/2019 CLINICAL DATA:  Code stroke. Right arm and leg weakness. Last seen normal 0930 hours EXAM: CT ANGIOGRAPHY HEAD AND NECK TECHNIQUE: Multidetector CT imaging of the head and neck was performed using the standard protocol during bolus administration of intravenous contrast. Multiplanar CT image reconstructions and MIPs were obtained to evaluate the vascular anatomy. Carotid stenosis measurements (when applicable) are obtained utilizing NASCET criteria, using the distal internal carotid diameter as the denominator. CONTRAST:  14mL OMNIPAQUE IOHEXOL 350 MG/ML SOLN COMPARISON:  CT earlier same day FINDINGS: CTA NECK FINDINGS Aortic arch: Minimal atherosclerotic plaque. No aneurysm or dissection. Right carotid system: Common carotid artery widely patent to the bifurcation. Carotid bifurcation is normal without soft or calcified plaque. Left carotid system: Left common carotid artery widely patent to the bifurcation. Carotid bifurcation normal without soft or calcified plaque. Vertebral arteries: Both widely patent. Left vertebral artery arises at the arch. Skeleton: Ordinary mild degenerative changes of the facet joints. Other neck: No mass or lymphadenopathy. Upper chest: Negative Review of the MIP images confirms the above findings CTA HEAD FINDINGS Anterior circulation: Both internal carotid arteries are widely patent through the skull base and siphon regions. No siphon stenosis. The anterior and middle cerebral vessels on the right are widely patent and normal. On the left, there is stenosis of the M1 segment estimated at 70%. This extends over a length of about 3-4 mm and is probably a chronic eccentric  stenosis. Beyond that, no large or medium vessel occlusion is identified. Posterior circulation: Both vertebral arteries are widely patent to the basilar. No basilar stenosis. Posterior circulation branch vessels are normal. Venous sinuses: Patent and normal. Anatomic variants: None significant. Delayed phase: No abnormal enhancement. 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 I favor that this is a chronic stenosis rather than evidence of an embolus. No distal vessel occlusion is identified. These results were communicated by text message at the time of interpretation on 03/28/2019 at 12:14 pm to Dr. Roland Rack. Electronically Signed   By: Nelson Chimes M.D.   On: 03/28/2019 12:19   Ct Head Code Stroke Wo Contrast  Result Date: 03/28/2019 CLINICAL DATA:  Code stroke. Right-sided arm and leg weakness. Last seen normal 0930 hours EXAM: CT HEAD WITHOUT CONTRAST TECHNIQUE: Contiguous axial images were obtained from the base of the skull through the vertex without intravenous contrast. COMPARISON:  None. FINDINGS: Brain: No acute infarction is identified. 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 There is an old infarction affecting the left basal ganglia and radiating white matter tracts per no evidence of intra-axial mass lesion, hemorrhage, hydrocephalus or extra-axial collection. 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 Vascular: No abnormal vascular finding. Skull: Negative Sinuses/Orbits: Clear/normal Other: None ASPECTS ([place] Stroke Program Early CT Score) - Ganglionic level infarction (caudate, lentiform nuclei, internal capsule, insula, M1-M3 cortex): 7, allowing that  the basal ganglia infarction is old. - Supraganglionic infarction (M4-M6 cortex): 3 Total score (0-10 with 10 being normal): 10 IMPRESSION: 1. No acute finding by CT. 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 2. Sellar/suprasellar mass.  MRI recommended for further evaluation. 3. ASPECTS is 10, allowing that the left basal ganglia infarction is old. 4. These results were communicated to Dr. Leonel Ramsay at [place] 4/13/2020by text page via the [facility] messaging system. Electronically Signed   By: Nelson Chimes M.D.   On: 03/28/2019 12:06    Procedures Procedures (including critical care time)  Medications Ordered in ED Medications  iohexol (OMNIPAQUE) 350 MG/ML injection 50 mL (50 mLs Intravenous Contrast Given 03/28/19 1207)     Initial Impression / Assessment and Plan / ED Course  I have reviewed the triage vital signs and the nursing notes.  Pertinent labs & imaging results that were available during my care of the patient were reviewed by me and considered in my medical decision making (see chart for details).        Given concern for ongoing right lower extremity weakness code stroke was activated. Seen by neurology.  Right leg weakness thought to be due to chronic right hip pain.  Dr. Leonel Ramsay recommends admission for TIA work-up.  Discussed with hospitalist who will see patient in the emergency department and admit. Final Clinical Impressions(s) / ED Diagnoses   Final diagnoses:  TIA (transient ischemic attack)    ED Discharge Orders    None       Julianne Rice, MD 03/28/19 1316

## 2019-03-28 NOTE — ED Triage Notes (Signed)
Pt here from home with c/o right side weakness that started 2 hours ago that has resolved ,

## 2019-03-28 NOTE — Progress Notes (Signed)
Initial Nutrition Assessment   RD working remotely.  DOCUMENTATION CODES:   Not applicable  INTERVENTION:  Provide Glucerna Shake po BID, each supplement provides 220 kcal and 10 grams of protein  Encourage adequate PO intake.   NUTRITION DIAGNOSIS:   Increased nutrient needs related to acute illness as evidenced by estimated needs.  GOAL:   Patient will meet greater than or equal to 90% of their needs  MONITOR:   PO intake, Supplement acceptance, Labs, Weight trends, Skin, I & O's  REASON FOR ASSESSMENT:   Consult Assessment of nutrition requirement/status  ASSESSMENT:   63 y.o. female with medical history significant of HTN and DM presenting with right-sided weakness.  Pt with probable TIA.   RD unable to obtain pt's nutrition history. Pt currently off unit for test per RN. RD to provide nutritional supplements to aid in caloric and protein needs.   Unable to complete Nutrition-Focused physical exam at this time.   Labs and medications reviewed.   Diet Order:   Diet Order            Diet heart healthy/carb modified Fluid consistency: Thin  Diet effective ____              EDUCATION NEEDS:   Not appropriate for education at this time  Skin:  Skin Assessment: Reviewed RN Assessment  Last BM:  Unknown  Height:   Ht Readings from Last 1 Encounters:  03/28/19 5\' 3"  (1.6 m)    Weight:   Wt Readings from Last 1 Encounters:  03/28/19 72.8 kg    Ideal Body Weight:  52.27 kg  BMI:  Body mass index is 28.43 kg/m.  Estimated Nutritional Needs:   Kcal:  1750-1900  Protein:  75-90 grams  Fluid:  1.7- 1.9 L/day    Corrin Parker, MS, RD, LDN Pager # [phone] After hours/ weekend pager # [phone]

## 2019-03-28 NOTE — Progress Notes (Addendum)
Patient arrived back to the unit alert and oriented X 4

## 2019-03-28 NOTE — Evaluation (Signed)
Physical Therapy Evaluation Patient Details Name: Cheryl Massey MRN: [id number] DOB: 1956/06/19 Today's Date: 03/28/2019   History of Present Illness  Patient is a 63 y/o female who presents with right sided weakness. Head CT-unremarkable for anything acute. Workup pending. Possible TIA. NIH:0. PMH includes HTN, DM.   Clinical Impression  Patient independent PTA and lives with spouse, daughter and grandchildren. Tolerated transfers, gait training and stair training Mod I. Tolerated higher level balance challenges with only mild deviations in gait pattern- backward walking, head turns etc. Education re: BeFAST. Interpreter Graciella present throughout session. Pt functioning at baseline and reports all symptoms have resolved. Does not require skilled therapy services. ALl education completed. Discharge from therapy.    Follow Up Recommendations No PT follow up    Equipment Recommendations  None recommended by PT    Recommendations for Other Services       Precautions / Restrictions Precautions Precautions: None Restrictions Weight Bearing Restrictions: No      Mobility  Bed Mobility Overal bed mobility: Modified Independent             General bed mobility comments: HOB elevated, no assist needed.   Transfers Overall transfer level: Independent               General transfer comment: Stood from EOB without difficulty, no dizziness.   Ambulation/Gait Ambulation/Gait assistance: Modified independent (Device/Increase time) Gait Distance (Feet): 300 Feet Assistive device: None Gait Pattern/deviations: Antalgic;Step-through pattern   Gait velocity interpretation: 1.31 - 2.62 ft/sec, indicative of limited community ambulator General Gait Details: Slow, steady and antalgic gait due to hx of hip replacement.   Stairs Stairs: Yes   Stair Management: One rail Right;Alternating pattern Number of Stairs: 3 General stair comments: No assist needed,  use of rail.  Wheelchair Mobility    Modified Rankin (Stroke Patients Only) Modified Rankin (Stroke Patients Only) Pre-Morbid Rankin Score: No significant disability Modified Rankin: No significant disability     Balance Overall balance assessment: Needs assistance Sitting-balance support: Feet supported;No upper extremity supported Sitting balance-Leahy Scale: Good     Standing balance support: During functional activity Standing balance-Leahy Scale: Good               High level balance activites: Head turns;Direction changes;Backward walking High Level Balance Comments: Tolerated above with mild deviations in gait but no overt LOB.             Pertinent Vitals/Pain Pain Assessment: No/denies pain    Home Living Family/patient expects to be discharged to:: Private residence Living Arrangements: Spouse/significant other;Children Available Help at Discharge: Family;Available 24 hours/day Type of Home: House Home Access: Stairs to enter Entrance Stairs-Rails: Right Entrance Stairs-Number of Steps: 2-3 Home Layout: One level        Prior Function Level of Independence: Independent         Comments: Cooks, cleans.      Hand Dominance        Extremity/Trunk Assessment   Upper Extremity Assessment Upper Extremity Assessment: Defer to OT evaluation    Lower Extremity Assessment Lower Extremity Assessment: RLE deficits/detail;LLE deficits/detail RLE Deficits / Details: Hx of hip replacement so leg length discrepancy noted. Wears shoes to help with this RLE Sensation: WNL LLE Sensation: WNL    Cervical / Trunk Assessment Cervical / Trunk Assessment: Normal  Communication   Communication: Interpreter utilized(Graciela used for interpreter)  Cognition Arousal/Alertness: Awake/alert Behavior During Therapy: WFL for tasks assessed/performed Overall Cognitive Status: Within Functional Limits for tasks  assessed                                         General Comments General comments (skin integrity, edema, etc.): Interpreter Graciella present for session.    Exercises     Assessment/Plan    PT Assessment Patent does not need any further PT services  PT Problem List         PT Treatment Interventions      PT Goals (Current goals can be found in the Care Plan section)  Acute Rehab PT Goals Patient Stated Goal: to go home PT Goal Formulation: All assessment and education complete, DC therapy    Frequency     Barriers to discharge        Co-evaluation               AM-PAC PT "6 Clicks" Mobility  Outcome Measure Help needed turning from your back to your side while in a flat bed without using bedrails?: None Help needed moving from lying on your back to sitting on the side of a flat bed without using bedrails?: None Help needed moving to and from a bed to a chair (including a wheelchair)?: None Help needed standing up from a chair using your arms (e.g., wheelchair or bedside chair)?: None Help needed to walk in hospital room?: None Help needed climbing 3-5 steps with a railing? : A Little 6 Click Score: 23    End of Session Equipment Utilized During Treatment: Gait belt Activity Tolerance: Patient tolerated treatment well Patient left: in bed;with call bell/phone within reach;Other (comment)(ECHO tech present) Nurse Communication: Mobility status PT Visit Diagnosis: Muscle weakness (generalized) (M62.81);Difficulty in walking, not elsewhere classified (R26.2)    Time: 5638-9373 PT Time Calculation (min) (ACUTE ONLY): 16 min   Charges:   PT Evaluation $PT Eval Low Complexity: 1 Low          Wray Kearns, PT, DPT Acute Rehabilitation Services Pager [phone] Office [phone]      Marguarite Arbour A Sabra Heck 03/28/2019, 3:35 PM

## 2019-03-28 NOTE — Progress Notes (Signed)
Patient arrived to the unit alert and oriented X 4 Spanish speaking only Denies pain Skin clean dry and intact Placed on tele Box #21 All questions and concerns addressed Bed in the lowest position with bed alarm set. Call light in reach.

## 2019-03-28 NOTE — Progress Notes (Signed)
  Echocardiogram 2D Echocardiogram has been performed.  Cheryl Massey 03/28/2019, 4:25 PM

## 2019-03-28 NOTE — ED Notes (Signed)
[facility] called to activate code stroke 

## 2019-03-28 NOTE — H&P (Signed)
History and Physical    Marleena Shubert Polanco EXH:[id number] DOB: 1956/01/24 DOA: 03/28/2019  PCP: Patient, No Pcp Per Consultants:  None Patient coming from:  Home - lives with 4 people including children   Chief Complaint:  weakness  HPI: Apryle Stowell Karle Plumber is a 63 y.o. female with medical history significant of HTN and DM presenting with right-sided weakness.  She feels weak and deviation on her left face and weakness of the right arm and leg.  No difficulty swallowing or speaking.  She did feel some cramps in her tongue and dryness in her mouth.  Symptoms started about 930 this AM while getting her coffee ready.  Symptoms lasted about 30 minutes and resolved completely.   ED Course:   Probable TIA.  Felt fine this AM and then difficulty holding coffee cup with right-sided weakness and tongue paresthesia.  Neuro has seen.  Head CT negative.  Reasonable to bring in for stroke work-up.  Review of Systems: As per HPI; otherwise review of systems reviewed and negative.   Ambulatory Status:  Ambulates without assistance  Past Medical History:  Diagnosis Date   Diabetes mellitus without complication ([place])    Hypertension     Past Surgical History:  Procedure Laterality Date   ABDOMINAL HYSTERECTOMY     APPENDECTOMY      Social History   Socioeconomic History   Marital status: Married    Spouse name: Not on file   Number of children: Not on file   Years of education: Not on file   Highest education level: Not on file  Occupational History   Not on file  Social Needs   Financial resource strain: Not on file   Food insecurity:    Worry: Not on file    Inability: Not on file   Transportation needs:    Medical: Not on file    Non-medical: Not on file  Tobacco Use   Smoking status: Never Smoker   Smokeless tobacco: Never Used  Substance and Sexual Activity   Alcohol use: No   Drug use: No   Sexual activity: Yes    Birth  control/protection: Surgical  Lifestyle   Physical activity:    Days per week: Not on file    Minutes per session: Not on file   Stress: Not on file  Relationships   Social connections:    Talks on phone: Not on file    Gets together: Not on file    Attends religious service: Not on file    Active member of club or organization: Not on file    Attends meetings of clubs or organizations: Not on file    Relationship status: Not on file   Intimate partner violence:    Fear of current or ex partner: Not on file    Emotionally abused: Not on file    Physically abused: Not on file    Forced sexual activity: Not on file  Other Topics Concern   Not on file  Social History Narrative   Not on file    No Known Allergies  Family History  Problem Relation Age of Onset   Hypertension Mother    Hypertension Father     Prior to Admission medications   Medication Sig Start Date End Date Taking? Authorizing Provider  glipiZIDE-metformin (METAGLIP) 5-500 MG per tablet Take 1 tablet by mouth 2 (two) times daily before a meal. 01/01/14  Yes Phelps, Erin O, PA-C  enalapril (VASOTEC) 10 MG  tablet Take 10 mg by mouth daily.    [provider]  guaiFENesin-codeine (ROBITUSSIN AC) 100-10 MG/5ML syrup Take 5 mLs by mouth 3 (three) times daily as needed for cough. Patient not taking: Reported on 03/28/2019 01/17/18   Glyn Ade, PA-C  omeprazole (PRILOSEC) 20 MG capsule Take 1 capsule (20 mg total) by mouth daily. Take one tablet daily Patient not taking: Reported on 03/28/2019 10/27/16   Carmin Muskrat, MD    Physical Exam: Vitals:   03/28/19 1208 03/28/19 1230 03/28/19 1300 03/28/19 1330  BP: (!) 150/77 133/69 (!) 146/84 (!) 146/79  Pulse: 62 (!) 56 (!) 56 (!) 53  Resp: 15 14 19 15   Temp:      TempSrc:      SpO2: 100% 97% 100% 100%  Weight:      Height:          General:  Appears calm and comfortable and is NAD  Eyes:  PERRL, EOMI, normal lids, iris  ENT:   grossly normal hearing, lips & tongue, mmm  Neck:  no LAD, masses or thyromegaly; no carotid bruits  Cardiovascular:  RRR, no m/r/g. No LE edema.   Respiratory:   CTA bilaterally with no wheezes/rales/rhonchi.  Normal respiratory effort.  Abdomen:  soft, NT, ND, NABS  Skin:  no rash or induration seen on limited exam  Musculoskeletal:  grossly normal tone BUE/BLE, good ROM, no bony abnormality  Psychiatric:  grossly normal mood and affect, speech fluent and appropriate, AOx3  Neurologic:  CN 2-12 grossly intact, moves all extremities in coordinated fashion, sensation intact    Radiological Exams on Admission: Ct Angio Head W Or Wo Contrast  Result Date: 03/28/2019 CLINICAL DATA:  Code stroke. Right arm and leg weakness. Last seen normal 0930 hours EXAM: CT ANGIOGRAPHY HEAD AND NECK TECHNIQUE: Multidetector CT imaging of the head and neck was performed using the standard protocol during bolus administration of intravenous contrast. Multiplanar CT image reconstructions and MIPs were obtained to evaluate the vascular anatomy. Carotid stenosis measurements (when applicable) are obtained utilizing NASCET criteria, using the distal internal carotid diameter as the denominator. CONTRAST:  23mL OMNIPAQUE IOHEXOL 350 MG/ML SOLN COMPARISON:  CT earlier same day FINDINGS: CTA NECK FINDINGS Aortic arch: Minimal atherosclerotic plaque. No aneurysm or dissection. Right carotid system: Common carotid artery widely patent to the bifurcation. Carotid bifurcation is normal without soft or calcified plaque. Left carotid system: Left common carotid artery widely patent to the bifurcation. Carotid bifurcation normal without soft or calcified plaque. Vertebral arteries: Both widely patent. Left vertebral artery arises at the arch. Skeleton: Ordinary mild degenerative changes of the facet joints. Other neck: No mass or lymphadenopathy. Upper chest: Negative Review of the MIP images confirms the above findings CTA  HEAD FINDINGS Anterior circulation: Both internal carotid arteries are widely patent through the skull base and siphon regions. No siphon stenosis. The anterior and middle cerebral vessels on the right are widely patent and normal. On the left, there is stenosis of the M1 segment estimated at 70%. This extends over a length of about 3-4 mm and is probably a chronic eccentric stenosis. Beyond that, no large or medium vessel occlusion is identified. Posterior circulation: Both vertebral arteries are widely patent to the basilar. No basilar stenosis. Posterior circulation branch vessels are normal. Venous sinuses: Patent and normal. Anatomic variants: None significant. Delayed phase: No abnormal enhancement. 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 I favor that this is a chronic stenosis rather than evidence of an embolus. No distal vessel occlusion is identified. These results were communicated by text message at the time of interpretation on 03/28/2019 at 12:14 pm to Dr. Roland Rack. Electronically Signed   By: Nelson Chimes M.D.   On: 03/28/2019 12:19   Ct Angio Neck W Or Wo Contrast  Result Date: 03/28/2019 CLINICAL DATA:  Code stroke. Right arm and leg weakness. Last seen normal 0930 hours EXAM: CT ANGIOGRAPHY HEAD AND NECK TECHNIQUE: Multidetector CT imaging of the head and neck was performed using the standard protocol during bolus administration of intravenous contrast. Multiplanar CT image reconstructions and MIPs were obtained to evaluate the vascular anatomy. Carotid stenosis measurements (when applicable) are obtained utilizing NASCET criteria, using the distal internal carotid diameter as the denominator. CONTRAST:  70mL OMNIPAQUE IOHEXOL 350 MG/ML SOLN COMPARISON:  CT earlier same day FINDINGS: CTA NECK FINDINGS Aortic arch: Minimal atherosclerotic plaque. No aneurysm or dissection. Right carotid  system: Common carotid artery widely patent to the bifurcation. Carotid bifurcation is normal without soft or calcified plaque. Left carotid system: Left common carotid artery widely patent to the bifurcation. Carotid bifurcation normal without soft or calcified plaque. Vertebral arteries: Both widely patent. Left vertebral artery arises at the arch. Skeleton: Ordinary mild degenerative changes of the facet joints. Other neck: No mass or lymphadenopathy. Upper chest: Negative Review of the MIP images confirms the above findings CTA HEAD FINDINGS Anterior circulation: Both internal carotid arteries are widely patent through the skull base and siphon regions. No siphon stenosis. The anterior and middle cerebral vessels on the right are widely patent and normal. On the left, there is stenosis of the M1 segment estimated at 70%. This extends over a length of about 3-4 mm and is probably a chronic eccentric stenosis. Beyond that, no large or medium vessel occlusion is identified. Posterior circulation: Both vertebral arteries are widely patent to the basilar. No basilar stenosis. Posterior circulation branch vessels are normal. Venous sinuses: Patent and normal. Anatomic variants: None significant. Delayed phase: No abnormal enhancement. 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 I favor that this is a chronic stenosis rather than evidence of an embolus. No distal vessel occlusion is identified. These results were communicated by text message at the time of interpretation on 03/28/2019 at 12:14 pm to Dr. Roland Rack. Electronically Signed   By: Nelson Chimes M.D.   On: 03/28/2019 12:19   Ct Head Code Stroke Wo Contrast  Result Date: 03/28/2019 CLINICAL DATA:  Code stroke. Right-sided arm and leg weakness. Last seen normal 0930 hours EXAM: CT HEAD WITHOUT CONTRAST TECHNIQUE: Contiguous axial images were obtained from  the base of the skull through the vertex without intravenous contrast. COMPARISON:  None. FINDINGS: Brain: No acute infarction is identified. 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 There is an old infarction affecting the left basal ganglia and radiating white matter tracts per no evidence of intra-axial mass lesion, hemorrhage, hydrocephalus or extra-axial collection. 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 Vascular: No abnormal vascular finding. Skull: Negative Sinuses/Orbits: Clear/normal Other: None ASPECTS ([place] Stroke Program Early CT Score) -  Ganglionic level infarction (caudate, lentiform nuclei, internal capsule, insula, M1-M3 cortex): 7, allowing that the basal ganglia infarction is old. - Supraganglionic infarction (M4-M6 cortex): 3 Total score (0-10 with 10 being normal): 10 IMPRESSION: 1. No acute finding by CT. 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 2. Sellar/suprasellar mass.  MRI recommended for further evaluation. 3. ASPECTS is 10, allowing that the left basal ganglia infarction is old. 4. These results were communicated to Dr. Leonel Ramsay at [place] 4/13/2020by text page via the [facility] messaging system. Electronically Signed   By: Nelson Chimes M.D.   On: 03/28/2019 12:06    EKG: Independently reviewed.  NSR with rate 70;  no evidence of acute ischemia   Labs on Admission: I have personally reviewed the available labs and imaging studies at the time of the admission.  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; there is also a sella/suprasellar mass that will be further evaluated by MRI  -Echo -Risk stratification with FLP, A1c; will also check TSH and UDS -ASA daily -Neurology consult -PT/OT/ST/Nutrition Consults -Check FLP; for now will start empiric Lipitor 40 mg daily  HTN -Allow permissive HTN for now -Treat BP only if >220/120, and then with goal of 15% reduction -Hold ACE and plan to restart in 48-72 hours  DM -Check A1c -Hold home PO medications (MetaGlip) -Will order moderate-scale SSI     DVT prophylaxis:  Lovenox  Code Status: Full  Family Communication: None present Disposition Plan:  Home once clinically improved Consults called: Neurology; PT/OT/ST/Nutrition  Admission status: It is my clinical opinion that referral for OBSERVATION is reasonable and necessary in this patient based on the above information provided. The aforementioned taken together are felt to place the patient at high risk for further clinical deterioration. However it is anticipated that the patient may be medically stable for discharge from the hospital within 24 to 48 hours.   Karmen Bongo MD [facility]   How to contact the [facility] Attending or Consulting provider [place] or covering provider during after hours [place], for this patient?  1. Check the care team in [facility] and look for a) attending/consulting [facility] provider listed and b) the [facility] team listed 2. Log into [email or web address] and use [place] universal password to access. If you do not have the password, please contact the hospital operator. 3. Locate the [facility] provider you are looking for under [facility] and page to a number that you can be directly reached. 4. If you still have difficulty  reaching the provider, please page the [facility] (Director on Call) for the Hospitalists listed on amion for assistance.   03/28/2019, 2:04 PM

## 2019-03-28 NOTE — Progress Notes (Signed)
Patient is off the floor for a test

## 2019-03-28 NOTE — ED Notes (Signed)
ED TO INPATIENT HANDOFF REPORT  ED Nurse Name and Phone #: Lovena Le [phone]  S Name/Age/Gender Cheryl Massey 63 y.o. female Room/Bed: 034C/034C  Code Status   Code Status: Not on file  Home/SNF/Other Home Patient oriented to: self, place, time and situation Is this baseline? Yes   Triage Complete: Triage complete  Chief Complaint rt sided weakness,left side facial  Triage Note Pt here from home with c/o right side weakness that started 2 hours ago that has resolved ,    Allergies No Known Allergies  Level of Care/Admitting Diagnosis ED Disposition    ED Disposition Condition [place]: [place] [100100]  Level of Care: Telemetry Medical [104]  I expect the patient will be discharged within 24 hours: Yes  LOW acuity---Tx typically complete <24 hrs---ACUTE conditions typically can be evaluated <24 hours---LABS likely to return to acceptable levels <24 hours---IS near functional baseline---EXPECTED to return to current living arrangement---NOT newly hypoxic: Meets criteria for 5C-Observation unit  Diagnosis: TIA (transient ischemic attack) [409735]  Admitting Physician: Karmen Bongo [[id number]]  Attending Physician: Karmen Bongo [[id number]]  PT Class (Do Not Modify): Observation [104]  PT Acc Code (Do Not Modify): Observation [10022]       B Medical/Surgery History Past Medical History:  Diagnosis Date  . Diabetes mellitus without complication ([place])   . Hypertension    Past Surgical History:  Procedure Laterality Date  . ABDOMINAL HYSTERECTOMY    . APPENDECTOMY       A IV Location/Drains/Wounds Patient Lines/Drains/Airways Status   Active Line/Drains/Airways    Name:   Placement date:   Placement time:   Site:   Days:   Peripheral IV 03/28/19 Left Antecubital   03/28/19    1139    Antecubital   less than 1          Intake/Output Last 24 hours No intake or output data in the 24 hours ending 03/28/19  1344  Labs/Imaging Results for orders placed or performed during the hospital encounter of 03/28/19 (from the past 48 hour(s))  Ethanol     Status: None   Collection Time: 03/28/19 11:44 AM  Result Value Ref Range   Alcohol, Ethyl (B) <10 <10 mg/dL    Comment: (NOTE) Lowest detectable limit for serum alcohol is 10 mg/dL. For medical purposes only. Performed at [facility], [place] [address], [place], [place] [address]   Protime-INR     Status: None   Collection Time: 03/28/19 11:44 AM  Result Value Ref Range   Prothrombin Time 12.8 11.4 - 15.2 seconds   INR 1.0 0.8 - 1.2    Comment: (NOTE) INR goal varies based on device and disease states. Performed at [facility], [place] [address] [place], [place] [address]   APTT     Status: None   Collection Time: 03/28/19 11:44 AM  Result Value Ref Range   aPTT 27 24 - 36 seconds    Comment: Performed at [facility] [address] [place] [address]  CBC     Status: None   Collection Time: 03/28/19 11:44 AM  Result Value Ref Range   WBC 5.9 4.0 - 10.5 K/uL   RBC 4.50 3.87 - 5.11 MIL/uL   Hemoglobin 13.0 12.0 - 15.0 g/dL   HCT 39.8 36.0 - 46.0 %   MCV 88.4 80.0 - 100.0 fL   MCH 28.9 26.0 - 34.0 pg   MCHC 32.7  30.0 - 36.0 g/dL   RDW 12.4 11.5 - 15.5 %   Platelets 293 150 - 400 K/uL   nRBC 0.0 0.0 - 0.2 %    Comment: Performed at [facility], [place] [address], [place], [place] [address]  Differential     Status: None   Collection Time: 03/28/19 11:44 AM  Result Value Ref Range   Neutrophils Relative % 56 %   Neutro Abs 3.3 1.7 - 7.7 K/uL   Lymphocytes Relative 31 %   Lymphs Abs 1.8 0.7 - 4.0 K/uL   Monocytes Relative 9 %   Monocytes Absolute 0.5 0.1 - 1.0 K/uL   Eosinophils Relative 2 %   Eosinophils Absolute 0.1 0.0 - 0.5 K/uL   Basophils Relative 1 %   Basophils Absolute 0.1 0.0 - 0.1 K/uL   Immature Granulocytes 1 %   Abs Immature Granulocytes 0.03 0.00 - 0.07 K/uL    Comment: Performed at  [facility], [place] [address], [place], [place] [address]  Comprehensive metabolic panel     Status: Abnormal   Collection Time: 03/28/19 11:44 AM  Result Value Ref Range   Sodium 139 135 - 145 mmol/L   Potassium 3.8 3.5 - 5.1 mmol/L   Chloride 103 98 - 111 mmol/L   CO2 24 22 - 32 mmol/L   Glucose, Bld 215 (H) 70 - 99 mg/dL   BUN 12 8 - 23 mg/dL   Creatinine, Ser 0.81 0.44 - 1.00 mg/dL   Calcium 9.5 8.9 - 10.3 mg/dL   Total Protein 7.0 6.5 - 8.1 g/dL   Albumin 4.0 3.5 - 5.0 g/dL   AST 22 15 - 41 U/L   ALT 22 0 - 44 U/L   Alkaline Phosphatase 71 38 - 126 U/L   Total Bilirubin 0.5 0.3 - 1.2 mg/dL   GFR calc non Af Amer >60 >60 mL/min   GFR calc Af Amer >60 >60 mL/min   Anion gap 12 5 - 15    Comment: Performed at [facility] [address], [place], [place] [address]  I-stat Creatinine, ED     Status: None   Collection Time: 03/28/19 11:52 AM  Result Value Ref Range   Creatinine, Ser 0.80 0.44 - 1.00 mg/dL   Ct Angio Head W Or Wo Contrast  Result Date: 03/28/2019 CLINICAL DATA:  Code stroke. Right arm and leg weakness. Last seen normal 0930 hours EXAM: CT ANGIOGRAPHY HEAD AND NECK TECHNIQUE: Multidetector CT imaging of the head and neck was performed using the standard protocol during bolus administration of intravenous contrast. Multiplanar CT image reconstructions and MIPs were obtained to evaluate the vascular anatomy. Carotid stenosis measurements (when applicable) are obtained utilizing NASCET criteria, using the distal internal carotid diameter as the denominator. CONTRAST:  49mL OMNIPAQUE IOHEXOL 350 MG/ML SOLN COMPARISON:  CT earlier same day FINDINGS: CTA NECK FINDINGS Aortic arch: Minimal atherosclerotic plaque. No aneurysm or dissection. Right carotid system: Common carotid artery widely patent to the bifurcation. Carotid bifurcation is normal without soft or calcified plaque. Left carotid system: Left common carotid artery widely patent to the bifurcation. Carotid  bifurcation normal without soft or calcified plaque. Vertebral arteries: Both widely patent. Left vertebral artery arises at the arch. Skeleton: Ordinary mild degenerative changes of the facet joints. Other neck: No mass or lymphadenopathy. Upper chest: Negative Review of the MIP images confirms the above findings CTA HEAD FINDINGS Anterior circulation: Both internal carotid arteries are widely patent through the skull base  and siphon regions. No siphon stenosis. The anterior and middle cerebral vessels on the right are widely patent and normal. On the left, there is stenosis of the M1 segment estimated at 70%. This extends over a length of about 3-4 mm and is probably a chronic eccentric stenosis. Beyond that, no large or medium vessel occlusion is identified. Posterior circulation: Both vertebral arteries are widely patent to the basilar. No basilar stenosis. Posterior circulation branch vessels are normal. Venous sinuses: Patent and normal. Anatomic variants: None significant. Delayed phase: No abnormal enhancement. 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 I favor that this is a chronic stenosis rather than evidence of an embolus. No distal vessel occlusion is identified. These results were communicated by text message at the time of interpretation on 03/28/2019 at 12:14 pm to Dr. Roland Rack. Electronically Signed   By: Nelson Chimes M.D.   On: 03/28/2019 12:19   Ct Angio Neck W Or Wo Contrast  Result Date: 03/28/2019 CLINICAL DATA:  Code stroke. Right arm and leg weakness. Last seen normal 0930 hours EXAM: CT ANGIOGRAPHY HEAD AND NECK TECHNIQUE: Multidetector CT imaging of the head and neck was performed using the standard protocol during bolus administration of intravenous contrast. Multiplanar CT image reconstructions and MIPs were obtained to evaluate the vascular anatomy. Carotid stenosis measurements  (when applicable) are obtained utilizing NASCET criteria, using the distal internal carotid diameter as the denominator. CONTRAST:  64mL OMNIPAQUE IOHEXOL 350 MG/ML SOLN COMPARISON:  CT earlier same day FINDINGS: CTA NECK FINDINGS Aortic arch: Minimal atherosclerotic plaque. No aneurysm or dissection. Right carotid system: Common carotid artery widely patent to the bifurcation. Carotid bifurcation is normal without soft or calcified plaque. Left carotid system: Left common carotid artery widely patent to the bifurcation. Carotid bifurcation normal without soft or calcified plaque. Vertebral arteries: Both widely patent. Left vertebral artery arises at the arch. Skeleton: Ordinary mild degenerative changes of the facet joints. Other neck: No mass or lymphadenopathy. Upper chest: Negative Review of the MIP images confirms the above findings CTA HEAD FINDINGS Anterior circulation: Both internal carotid arteries are widely patent through the skull base and siphon regions. No siphon stenosis. The anterior and middle cerebral vessels on the right are widely patent and normal. On the left, there is stenosis of the M1 segment estimated at 70%. This extends over a length of about 3-4 mm and is probably a chronic eccentric stenosis. Beyond that, no large or medium vessel occlusion is identified. Posterior circulation: Both vertebral arteries are widely patent to the basilar. No basilar stenosis. Posterior circulation branch vessels are normal. Venous sinuses: Patent and normal. Anatomic variants: None significant. Delayed phase: No abnormal enhancement. 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 I favor that this is a chronic stenosis rather than evidence of an embolus. No distal vessel occlusion is identified. These results were communicated by text message at the time of interpretation on 03/28/2019 at 12:14 pm to Dr. Roland Rack. Electronically Signed   By: Nelson Chimes M.D.   On: 03/28/2019 12:19   Ct Head Code Stroke Wo Contrast  Result Date: 03/28/2019 CLINICAL DATA:  Code stroke. Right-sided arm and leg weakness. Last seen normal 0930 hours EXAM: CT HEAD WITHOUT CONTRAST TECHNIQUE: Contiguous axial images were obtained from the base of the skull through  the vertex without intravenous contrast. COMPARISON:  None. FINDINGS: Brain: No acute infarction is identified. 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 There is an old infarction affecting the left basal ganglia and radiating white matter tracts per no evidence of intra-axial mass lesion, hemorrhage, hydrocephalus or extra-axial collection. 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 Vascular: No abnormal vascular finding. Skull: Negative Sinuses/Orbits: Clear/normal Other: None ASPECTS ([place] Stroke Program Early CT Score) - Ganglionic level infarction (caudate, lentiform nuclei, internal capsule, insula, M1-M3 cortex): 7, allowing that the basal ganglia infarction is old. - Supraganglionic infarction (M4-M6 cortex): 3 Total score (0-10 with 10 being normal): 10 IMPRESSION: 1. No acute finding by CT. 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 2. Sellar/suprasellar mass.  MRI recommended for further evaluation. 3. ASPECTS is 10, allowing that the left basal ganglia infarction is old. 4. These results were communicated to Dr. Leonel Ramsay at [address] 4/13/2020by text page via the [facility] messaging system. Electronically Signed   By: Nelson Chimes M.D.   On: 03/28/2019 12:06    Pending Labs Unresulted Labs (From admission, onward)    Start     Ordered   03/28/19 1142  Urine  rapid drug screen (hosp performed)  ONCE - STAT,   STAT     03/28/19 1142   03/28/19 1142  Urinalysis, Routine w reflex microscopic  ONCE - STAT,   STAT     03/28/19 1142          Vitals/Pain Today's Vitals   03/28/19 1208 03/28/19 1230 03/28/19 1300 03/28/19 1330  BP: (!) 150/77 133/69 (!) 146/84 (!) 146/79  Pulse: 62 (!) 56 (!) 56 (!) 53  Resp: 15 14 19 15   Temp:      TempSrc:      SpO2: 100% 97% 100% 100%  Weight:      Height:      PainSc:        Isolation Precautions No active isolations  Medications Medications  iohexol (OMNIPAQUE) 350 MG/ML injection 50 mL (50 mLs Intravenous Contrast Given 03/28/19 1207)    Mobility walks Low fall risk   Focused Assessments Neuro Assessment Handoff:  Swallow screen pass? Yes    NIH Stroke Scale ( + Modified Stroke Scale Criteria)  Interval: Initial Level of Consciousness (1a.)   : Alert, keenly responsive LOC Questions (1b. )   +: Answers both questions correctly LOC Commands (1c. )   + : Performs both tasks correctly Best Gaze (2. )  +: Normal Visual (3. )  +: No visual loss Facial Palsy (4. )    : Normal symmetrical movements Motor Arm, Left (5a. )   +: No drift Motor Arm, Right (5b. )   +: No drift Motor Leg, Left (6a. )   +: No drift Motor Leg, Right (6b. )   +: No drift Limb Ataxia (7. ): Absent Sensory (8. )   +: Normal, no sensory loss Best Language (9. )   +: No aphasia Dysarthria (10. ): Normal Extinction/Inattention (11.)   +: No Abnormality Modified SS Total  +: 0 Complete NIHSS TOTAL: 0 Last date known well: 03/28/19 Last time known well: 0930 Neuro Assessment:   Neuro Checks:   Initial (03/28/19 1200)  Last Documented NIHSS Modified Score: 0 (03/28/19 1330) Has TPA been given? No If patient is a Neuro Trauma and patient is going to OR before floor call report to [facility] nurse: [phone] or [phone]     R Recommendations: See Admitting Provider Note  Report given to:   Additional  Notes:

## 2019-03-28 NOTE — Code Documentation (Signed)
63yo female arriving to [facility] via private vehicle at 1124. Patient was reportedly LKW at 0930. She developed right sided weakness and presented to the ED. Code stroke activated and patient to CT. CT completed. Stroke team to the bedside. NIHSS 0, see documentation for details and code stroke times. CTA head and neck completed. TIA alert. Code stroke to be re-activated if symptoms return. Bedside handoff with ED RN Lovena Le.

## 2019-03-29 ENCOUNTER — Other Ambulatory Visit: Payer: Self-pay

## 2019-03-29 DIAGNOSIS — R29700 NIHSS score 0: Secondary | ICD-10-CM | POA: Diagnosis present

## 2019-03-29 DIAGNOSIS — Z6828 Body mass index (BMI) 28.0-28.9, adult: Secondary | ICD-10-CM | POA: Diagnosis not present

## 2019-03-29 DIAGNOSIS — G8929 Other chronic pain: Secondary | ICD-10-CM | POA: Diagnosis present

## 2019-03-29 DIAGNOSIS — M25551 Pain in right hip: Secondary | ICD-10-CM | POA: Diagnosis present

## 2019-03-29 DIAGNOSIS — Z7984 Long term (current) use of oral hypoglycemic drugs: Secondary | ICD-10-CM | POA: Diagnosis not present

## 2019-03-29 DIAGNOSIS — I10 Essential (primary) hypertension: Secondary | ICD-10-CM | POA: Diagnosis present

## 2019-03-29 DIAGNOSIS — E663 Overweight: Secondary | ICD-10-CM | POA: Diagnosis present

## 2019-03-29 DIAGNOSIS — Z90710 Acquired absence of both cervix and uterus: Secondary | ICD-10-CM | POA: Diagnosis not present

## 2019-03-29 DIAGNOSIS — Z8249 Family history of ischemic heart disease and other diseases of the circulatory system: Secondary | ICD-10-CM | POA: Diagnosis not present

## 2019-03-29 DIAGNOSIS — Z79899 Other long term (current) drug therapy: Secondary | ICD-10-CM | POA: Diagnosis not present

## 2019-03-29 DIAGNOSIS — G459 Transient cerebral ischemic attack, unspecified: Secondary | ICD-10-CM | POA: Diagnosis present

## 2019-03-29 DIAGNOSIS — D352 Benign neoplasm of pituitary gland: Secondary | ICD-10-CM | POA: Diagnosis present

## 2019-03-29 DIAGNOSIS — I63512 Cerebral infarction due to unspecified occlusion or stenosis of left middle cerebral artery: Secondary | ICD-10-CM | POA: Diagnosis present

## 2019-03-29 DIAGNOSIS — E119 Type 2 diabetes mellitus without complications: Secondary | ICD-10-CM | POA: Diagnosis present

## 2019-03-29 DIAGNOSIS — E785 Hyperlipidemia, unspecified: Secondary | ICD-10-CM | POA: Diagnosis present

## 2019-03-29 LAB — HEMOGLOBIN A1C
Hgb A1c MFr Bld: 8.8 % — ABNORMAL HIGH (ref 4.8–5.6)
Mean Plasma Glucose: 206 mg/dL

## 2019-03-29 LAB — GLUCOSE, CAPILLARY
Glucose-Capillary: 92 mg/dL (ref 70–99)
Glucose-Capillary: 100 mg/dL — ABNORMAL HIGH (ref 70–99)
Glucose-Capillary: 108 mg/dL — ABNORMAL HIGH (ref 70–99)
Glucose-Capillary: 129 mg/dL — ABNORMAL HIGH (ref 70–99)
Glucose-Capillary: 163 mg/dL — ABNORMAL HIGH (ref 70–99)

## 2019-03-29 LAB — HIV ANTIBODY (ROUTINE TESTING W REFLEX): HIV Screen 4th Generation wRfx: NONREACTIVE

## 2019-03-29 LAB — LDL CHOLESTEROL, DIRECT: Direct LDL: 92.9 mg/dL (ref 0–99)

## 2019-03-29 LAB — LIPID PANEL
Cholesterol: 202 mg/dL — ABNORMAL HIGH (ref 0–200)
HDL: 41 mg/dL (ref >40)
LDL Cholesterol: UNDETERMINED mg/dL (ref 0–99)
Total CHOL/HDL Ratio: 4.9 RATIO
Triglycerides: 497 mg/dL — ABNORMAL HIGH (ref <150)
VLDL: UNDETERMINED mg/dL (ref 0–40)

## 2019-03-29 LAB — TSH: TSH: 1.136 u[IU]/mL (ref 0.350–4.500)

## 2019-03-29 MED ORDER — ASPIRIN 81 MG PO TBEC: 81.0000 mg | DELAYED_RELEASE_TABLET | Freq: Every day | ORAL | 0 refills | Status: DC

## 2019-03-29 MED ORDER — LABETALOL HCL 5 MG/ML IV SOLN: 10.0000 mg | INTRAVENOUS | Status: DC | PRN

## 2019-03-29 MED ORDER — ATORVASTATIN CALCIUM 40 MG PO TABS: 40.0000 mg | ORAL_TABLET | Freq: Every day | ORAL | 0 refills | Status: DC

## 2019-03-29 MED ORDER — CLOPIDOGREL BISULFATE 75 MG PO TABS: 75.0000 mg | ORAL_TABLET | Freq: Every day | ORAL | 0 refills | Status: AC

## 2019-03-29 MED ORDER — ASPIRIN EC 81 MG PO TBEC: 81.0000 mg | DELAYED_RELEASE_TABLET | Freq: Every day | ORAL | Status: DC

## 2019-03-29 MED ORDER — LOSARTAN POTASSIUM 25 MG PO TABS: 25.0000 mg | ORAL_TABLET | Freq: Every day | ORAL | 0 refills | Status: DC

## 2019-03-29 MED ORDER — GLIPIZIDE-METFORMIN HCL 5-500 MG PO TABS: 1.0000 | ORAL_TABLET | Freq: Two times a day (BID) | ORAL | 0 refills | Status: DC

## 2019-03-29 NOTE — TOC Initial Note (Signed)
Transition of Care [facility]) - Initial/Assessment Note    Patient Details  Name: Cheryl Massey MRN: [id number] Date of Birth: 1956/06/20  [facility] CM/SW Contact:    Pollie Friar, RN Phone Number: 03/29/2019, 11:27 AM  Clinical Narrative:                   Expected Discharge Plan: Home/Self Care Barriers to Discharge: Continued Medical Work up, Inadequate or no insurance   Patient Goals and CMS Choice        Expected Discharge Plan and Services Expected Discharge Plan: Home/Self Care   Discharge Planning Services: CM Consult, [facility], Medication Assistance   Living arrangements for the past 2 months: Apartment(ground level--3 steps to enter)                          Prior Living Arrangements/Services Living arrangements for the past 2 months: Apartment(ground level--3 steps to enter) Lives with:: Spouse, Adult Children(daughter) Patient language and need for interpreter reviewed:: Yes(pt speaks spanish--CM used Paediatric nurse ([place] interpreter) for the information) Do you feel safe going back to the place where you live?: Yes      Need for Family Participation in Patient Care: No (Comment)(none recommended by PT/OT) Care giver support system in place?: Yes (comment)(Pt states spouse can provide supervision but not physical assistance. Daughter can provide physical assist.)   Criminal Activity/Legal Involvement Pertinent to Current Situation/Hospitalization: No - Comment as needed  Activities of Daily Living      Permission Sought/Granted                  Emotional Assessment Appearance:: Appears stated age Attitude/Demeanor/Rapport: Engaged Affect (typically observed): Accepting, Pleasant, Appropriate Orientation: : Oriented to Self, Oriented to Place, Oriented to  Time, Oriented to Situation   Psych Involvement: No (comment)  Admission diagnosis:  TIA (transient ischemic attack) [G45.9] Patient Active  Problem List   Diagnosis Date Noted  . TIA (transient ischemic attack) 03/28/2019  . Essential hypertension 03/28/2019  . Type 2 diabetes mellitus treated without insulin (Cheryl Massey) 03/28/2019   PCP:  Patient, No Pcp Per Pharmacy:   [facility] [place], [place] [id number] Phone: [phone] Fax: [phone]     Social Determinants of Health (SDOH) Interventions  Pt denies issues with home medications. Pt denies issues with transportation.  Readmission Risk Interventions No flowsheet data found.

## 2019-03-29 NOTE — Discharge Summary (Signed)
Physician Discharge Summary  Cheryl Massey QIH:[id number] DOB: 1956-10-28 DOA: 03/28/2019  PCP: Patient, No Pcp Per  Admit date: 03/28/2019 Discharge date: 03/29/2019  Admitted From: Home Disposition:  Home  Discharge Condition:Stable CODE STATUS:FULL Diet recommendation: Heart Healthy  Brief/Interim Summary:  Patient is a 63 year old Hispanic female with history of hypertension, diabetes who presented from home with complaints of right-sided weakness.  She felt weak and said she had deviation on her left face and weakness of the right arm and leg.  Head CT was negative.  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 She underwent full stroke work-up.  She was evaluated PT/OT/speech and not recommended any follow-up.  She has been started on dual antiplatelet regimen.  Neurosurgery recommended to follow-up as an outpatient.  Following problems were addressed during her hospitalization:  Acute ischemic stroke:MRI of the brain showed small acute cortical infarcts in the left temporal, occipital, and parietal lobes.  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  Echocardiogram showed left ventricle ejection fraction of 60 to 65%, mild LVH.  Patient seen by neurology.  Recommended aspirin and lavix for 21 days followed by aspirin alone.  She will follow-up with neurology as an outpatient after discharge.  She has mild right lower extremity weakness otherwise no focal neurological deficits.  She has been seen by PT/OT/speech and cleared and no follow-up recommended. Started on Lipitor 40 mg daily.  LDL was not calculated due to high triglyceride.   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  New finding.   Neurosurgery already consulted.  She does not have any visual deficits.  Denies any headache. We have sent for prolactin and growth hormone.  Levels pending.  Seen by neurosurgery and recommended to follow up as an outpatient.  Diabetes mellitus: Hemoglobin A1c 8.8.  Glipizide-metformin combination at home.  She says she is compliant with her medication.  She does not have any primary care physician.  She said she had a doctor in [place] and saw the doctor about 8 months ago.  She needs to follow-up with the PCP here.  Case manager already arranged a follow-up. She can continue her home meds on discharge.  Follow-up hemoglobin A1c in 3 months .  Hypertension: Not on medication at home.Started on losartan.  Discharge Diagnoses:  Principal Problem:   TIA (transient ischemic attack) Active Problems:   Essential hypertension   Type 2 diabetes mellitus treated without insulin [facility])    Discharge Instructions  Discharge Instructions    Ambulatory referral to Neurology   Complete by:  As directed    An appointment is requested in approximately: 4 weeks   Diet - low sodium heart healthy   Complete by:  As directed    Discharge instructions   Complete by:  As directed    1) Follow up at [facility] on the given appointment date. 2)Take prescribed medications as instructed.  3)Follow up with neurosurgery as an outpatient.Name and number of the provider has been attached. 4)Follow up with neurology in 4 weeks.Name and number of the provider group has been attached. 5)Check HbA1C in 3 months.   Increase activity slowly   Complete by:  As directed      Allergies as of 03/29/2019  No Known Allergies     Medication List    STOP taking these medications   UNABLE TO FIND   UNABLE TO FIND     TAKE these medications   aspirin 81 MG EC tablet Take 1 tablet (81 mg total) by mouth daily. Start taking on:  March 30, 2019   atorvastatin 40 MG  tablet Commonly known as:  LIPITOR Take 1 tablet (40 mg total) by mouth daily at 6 PM.   clopidogrel 75 MG tablet Commonly known as:  PLAVIX Take 1 tablet (75 mg total) by mouth daily for 20 days. Start taking on:  March 30, 2019   glipiZIDE-metformin 5-500 MG tablet Commonly known as:  METAGLIP Take 1 tablet by mouth 2 (two) times daily before a meal for 30 days.   losartan 25 MG tablet Commonly known as:  Cozaar Take 1 tablet (25 mg total) by mouth daily for 30 days.      Follow-up [facility] up on 04/11/2019.   Why:  [facility] will call you a few days before appointment to see if you need to come in or can be seen over the phone.  If go to office please bring picture ID, current meds. Please also use the pharmacy at this location for assistance with your medications. Contact information: [address] [place] [address] [phone]       Judith Part, MD. Schedule an appointment as soon as possible for a visit in 3 week(s).   Specialty:  Neurosurgery Contact information: [place] [address] [phone]        Laney Potash .   Specialty:  Unknown Physician Specialty       [facility]. Schedule an appointment as soon as possible for a visit in 4 week(s).   Specialty:  Neurology Contact information: [address] [place] [address] [phone]         No Known Allergies  Consultations:  Neurology, neurosurgery   Procedures/Studies: Ct Angio Head W Or Wo Contrast  Result Date: 03/28/2019 CLINICAL DATA:  Code stroke. Right arm and leg weakness. Last seen normal 0930 hours EXAM: CT ANGIOGRAPHY HEAD AND NECK TECHNIQUE: Multidetector CT imaging of the head and neck was performed using the standard protocol during bolus administration of intravenous contrast. Multiplanar CT image reconstructions and MIPs were  obtained to evaluate the vascular anatomy. Carotid stenosis measurements (when applicable) are obtained utilizing NASCET criteria, using the distal internal carotid diameter as the denominator. CONTRAST:  38mL OMNIPAQUE IOHEXOL 350 MG/ML SOLN COMPARISON:  CT earlier same day FINDINGS: CTA NECK FINDINGS Aortic arch: Minimal atherosclerotic plaque. No aneurysm or dissection. Right carotid system: Common carotid artery widely patent to the bifurcation. Carotid bifurcation is normal without soft or calcified plaque. Left carotid system: Left common carotid artery widely patent to the bifurcation. Carotid bifurcation normal without soft or calcified plaque. Vertebral arteries: Both widely patent. Left vertebral artery arises at the arch. Skeleton: Ordinary mild degenerative changes of the facet joints. Other neck: No mass or lymphadenopathy. Upper chest: Negative Review of the MIP images confirms the above findings CTA HEAD FINDINGS Anterior circulation: Both internal carotid arteries are widely patent through the skull base and siphon regions. No siphon stenosis. The anterior and middle cerebral vessels on the right are widely patent and normal. On the left, there is stenosis of the M1 segment estimated at 70%. This extends over  a length of about 3-4 mm and is probably a chronic eccentric stenosis. Beyond that, no large or medium vessel occlusion is identified. Posterior circulation: Both vertebral arteries are widely patent to the basilar. No basilar stenosis. Posterior circulation branch vessels are normal. Venous sinuses: Patent and normal. Anatomic variants: None significant. Delayed phase: No abnormal enhancement. 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 I favor that this is a chronic stenosis rather than evidence of an embolus. No distal vessel occlusion is identified. These results were communicated by text message at  the time of interpretation on 03/28/2019 at 12:14 pm to Dr. Roland Rack. Electronically Signed   By: Nelson Chimes M.D.   On: 03/28/2019 12:19   Ct Angio Neck W Or Wo Contrast  Result Date: 03/28/2019 CLINICAL DATA:  Code stroke. Right arm and leg weakness. Last seen normal 0930 hours EXAM: CT ANGIOGRAPHY HEAD AND NECK TECHNIQUE: Multidetector CT imaging of the head and neck was performed using the standard protocol during bolus administration of intravenous contrast. Multiplanar CT image reconstructions and MIPs were obtained to evaluate the vascular anatomy. Carotid stenosis measurements (when applicable) are obtained utilizing NASCET criteria, using the distal internal carotid diameter as the denominator. CONTRAST:  80mL OMNIPAQUE IOHEXOL 350 MG/ML SOLN COMPARISON:  CT earlier same day FINDINGS: CTA NECK FINDINGS Aortic arch: Minimal atherosclerotic plaque. No aneurysm or dissection. Right carotid system: Common carotid artery widely patent to the bifurcation. Carotid bifurcation is normal without soft or calcified plaque. Left carotid system: Left common carotid artery widely patent to the bifurcation. Carotid bifurcation normal without soft or calcified plaque. Vertebral arteries: Both widely patent. Left vertebral artery arises at the arch. Skeleton: Ordinary mild degenerative changes of the facet joints. Other neck: No mass or lymphadenopathy. Upper chest: Negative Review of the MIP images confirms the above findings CTA HEAD FINDINGS Anterior circulation: Both internal carotid arteries are widely patent through the skull base and siphon regions. No siphon stenosis. The anterior and middle cerebral vessels on the right are widely patent and normal. On the left, there is stenosis of the M1 segment estimated at 70%. This extends over a length of about 3-4 mm and is probably a chronic eccentric stenosis. Beyond that, no large or medium vessel occlusion is identified. Posterior circulation: Both  vertebral arteries are widely patent to the basilar. No basilar stenosis. Posterior circulation branch vessels are normal. Venous sinuses: Patent and normal. Anatomic variants: None significant. Delayed phase: No abnormal enhancement. 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 I favor that this is a chronic stenosis rather than evidence of an embolus. No distal vessel occlusion is identified. These results were communicated by text message at the time of interpretation on 03/28/2019 at 12:14 pm to Dr. Roland Rack. Electronically Signed   By: Nelson Chimes M.D.   On: 03/28/2019 12:19   Mr Jeri Cos YF Contrast  Result Date: 03/28/2019 CLINICAL DATA:  Right arm and leg weakness lasting 30 minutes this morning. EXAM: MRI HEAD WITHOUT AND WITH CONTRAST TECHNIQUE: Multiplanar, multiecho pulse sequences of the brain and surrounding structures were obtained without and with intravenous contrast. CONTRAST:  7 mL Gadavist COMPARISON:  Head CT 03/28/2019 FINDINGS: Brain: There are predominantly punctate foci of acute cortical infarction involving the lateral aspects of the left temporal, occipital, and parietal  lobes. No intracranial hemorrhage, midline shift, or extra-axial fluid collection is identified. There is a small chronic infarct involving the left basal ganglia and corona radiata with mild ex vacuo dilatation of the body of the left lateral ventricle. Dedicated pituitary protocol imaging was performed. There is a predominantly homogeneously enhancing mass filling and expanding the sella turcica which measures 4.8 x 2.0 x 2.8 cm. The mass extends into the suprasellar cistern and left cavernous sinus. There is mild extension anteriorly on the right along the planum sphenoidale, and there is more notable extension posterolaterally on the left with mild mass effect on the left cerebral peduncle and mesial left temporal  lobe. There is dilatation of the temporal horn of the left lateral ventricle and/or a separate small cystic area/possible encephalomalacia along the lateral aspect of the mass in the mesial temporal lobe. The mass abuts the anterior aspect of the optic chiasm and the pre chiasmatic optic nerves without frank compression although the nerve is laterally displaced on the right. No normal pituitary gland is identified. Vascular: Major intracranial vascular flow voids are preserved. Skull and upper cervical spine: Unremarkable bone marrow signal. Sinuses/Orbits: Unremarkable orbits. Minimal right maxillary sinus mucosal thickening. Clear mastoid air cells. Other: None. IMPRESSION: 1. Small acute cortical infarcts in the left temporal, occipital, and parietal lobes. 2. Chronic left basal ganglia infarct. 3. 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 Electronically Signed   By: Logan Bores M.D.   On: 03/28/2019 18:25   Ct Head Code Stroke Wo Contrast  Result Date: 03/28/2019 CLINICAL DATA:  Code stroke. Right-sided arm and leg weakness. Last seen normal 0930 hours EXAM: CT HEAD WITHOUT CONTRAST TECHNIQUE: Contiguous axial images were obtained from the base of the skull through the vertex without intravenous contrast. COMPARISON:  None. FINDINGS: Brain: No acute infarction is identified. 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 There is an old infarction affecting the left basal ganglia and radiating white matter tracts per no evidence of intra-axial mass lesion, hemorrhage, hydrocephalus or extra-axial collection. 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 Vascular: No abnormal vascular finding. Skull: Negative Sinuses/Orbits: Clear/normal Other: None ASPECTS  ([place] Stroke Program Early CT Score) - Ganglionic level infarction (caudate, lentiform nuclei, internal capsule, insula, M1-M3 cortex): 7, allowing that the basal ganglia infarction is old. - Supraganglionic infarction (M4-M6 cortex): 3 Total score (0-10 with 10 being normal): 10 IMPRESSION: 1. No acute finding by CT. 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 2. Sellar/suprasellar mass.  MRI recommended for further evaluation. 3. ASPECTS is 10, allowing that the left basal ganglia infarction is old. 4. These results were communicated to Dr. Leonel Ramsay at [place] 4/13/2020by text page via the [facility] messaging system. Electronically Signed   By: Nelson Chimes M.D.   On: 03/28/2019 12:06        Discharge Exam: Vitals:   03/29/19 1242 03/29/19 1626  BP: (!) 147/85 (!) 164/92  Pulse: 70 68  Resp: 15 15  Temp: 98.6 F (37 C) (!) 97.5 F (36.4 C)  SpO2: 98% 99%   Vitals:   03/29/19 0851 03/29/19 0851 03/29/19 1242 03/29/19 1626  BP: (!) 169/74 (!) 169/74 (!) 147/85 (!) 164/92  Pulse: (!) 54 (!) 54 70 68  Resp: 16 16 15 15   Temp: 98 F (36.7 C) 98 F (36.7 C) 98.6 F (37 C) (!) 97.5 F (36.4 C)  TempSrc: Oral Oral Oral Oral  SpO2: 99% 98% 98% 99%  Weight:      Height:        General: Pt is alert, awake, not in acute distress Cardiovascular: RRR, S1/S2 +, no rubs, no gallops Respiratory: CTA bilaterally, no wheezing, no rhonchi Abdominal: Soft, NT, ND, bowel sounds + Extremities: no edema, no cyanosis    The results of significant diagnostics from this hospitalization (including imaging, microbiology, ancillary and laboratory) are listed below for reference.     Microbiology: No results found for this or any previous visit (from the past 240 hour(s)).   Labs: BNP (last 3 results) No results for input(s): BNP in the last 8760 hours. Basic Metabolic Panel: Recent Labs  Lab 03/28/19 1144 03/28/19 1152  NA  139  --   K 3.8  --   CL 103  --   CO2 24  --   GLUCOSE 215*  --   BUN 12  --   CREATININE 0.81 0.80  CALCIUM 9.5  --    Liver Function Tests: Recent Labs  Lab 03/28/19 1144  AST 22  ALT 22  ALKPHOS 71  BILITOT 0.5  PROT 7.0  ALBUMIN 4.0   No results for input(s): LIPASE, AMYLASE in the last 168 hours. No results for input(s): AMMONIA in the last 168 hours. CBC: Recent Labs  Lab 03/28/19 1144  WBC 5.9  NEUTROABS 3.3  HGB 13.0  HCT 39.8  MCV 88.4  PLT 293   Cardiac Enzymes: No results for input(s): CKTOTAL, CKMB, CKMBINDEX, TROPONINI in the last 168 hours. BNP: Invalid input(s): POCBNP CBG: Recent Labs  Lab 03/29/19 0604 03/29/19 0942 03/29/19 0943 03/29/19 1153 03/29/19 1615  GLUCAP 92 100* 108* 129* 163*   D-Dimer No results for input(s): DDIMER in the last 72 hours. Hgb A1c Recent Labs    03/28/19 1452  HGBA1C 8.8*   Lipid Profile Recent Labs    03/29/19 0425  CHOL 202*  HDL 41  LDLCALC UNABLE TO CALCULATE IF TRIGLYCERIDE OVER 400 mg/dL  TRIG 497*  CHOLHDL 4.9  LDLDIRECT 92.9   Thyroid function studies Recent Labs    03/29/19 0818  TSH 1.136   Anemia work up No results for input(s): VITAMINB12, FOLATE, FERRITIN, TIBC, IRON, RETICCTPCT in the last 72 hours. Urinalysis    Component Value Date/Time   COLORURINE STRAW (A) 03/28/2019 1255   APPEARANCEUR CLEAR 03/28/2019 1255   LABSPEC 1.024 03/28/2019 1255   PHURINE 6.0 03/28/2019 1255   GLUCOSEU 50 (A) 03/28/2019 1255   HGBUR NEGATIVE 03/28/2019 1255   BILIRUBINUR NEGATIVE 03/28/2019 [place] 03/28/2019 1255   PROTEINUR NEGATIVE 03/28/2019 1255   NITRITE NEGATIVE 03/28/2019 [place] 03/28/2019 1255   Sepsis Labs Invalid input(s): PROCALCITONIN,  WBC,  LACTICIDVEN Microbiology No results found for this or any previous visit (from the past 240 hour(s)).  Please note: You were cared for by a hospitalist during your hospital stay. Once you  are discharged, your primary care physician will handle any further medical issues. Please note that NO REFILLS for any discharge medications will be authorized once you are discharged, as it is imperative that you return to your primary care physician (or establish a relationship with a primary care physician if you do not have one) for your post hospital discharge needs so that  they can reassess your need for medications and monitor your lab values.    Time coordinating discharge: 40 minutes  SIGNED:   Shelly Coss, MD  [facility] 03/29/2019, 5:29 PM Pager [phone]  If 7PM-7AM, please contact night-coverage [email or web address] Password [email or web address]

## 2019-03-29 NOTE — Progress Notes (Signed)
STROKE TEAM PROGRESS NOTE   INTERVAL HISTORY Patient was examined using Spanish language interpreter at the bedside.  She states her transient right-sided symptoms have completely resolved.  She denies any other accompanying focal neurological symptoms.  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  She denies any neuroendocrine or vision symptoms  Vitals:   03/29/19 0345 03/29/19 0851 03/29/19 0851 03/29/19 1242  BP: (!) 143/82 (!) 169/74 (!) 169/74 (!) 147/85  Pulse: (!) 57 (!) 54 (!) 54 70  Resp: 16 16 16 15   Temp: 97.6 F (36.4 C) 98 F (36.7 C) 98 F (36.7 C) 98.6 F (37 C)  TempSrc: Oral Oral Oral Oral  SpO2: 98% 99% 98% 98%  Weight:      Height:        CBC:  Recent Labs  Lab 03/28/19 1144  WBC 5.9  NEUTROABS 3.3  HGB 13.0  HCT 39.8  MCV 88.4  PLT 735    Basic Metabolic Panel:  Recent Labs  Lab 03/28/19 1144 03/28/19 1152  NA 139  --   K 3.8  --   CL 103  --   CO2 24  --   GLUCOSE 215*  --   BUN 12  --   CREATININE 0.81 0.80  CALCIUM 9.5  --    Lipid Panel:     Component Value Date/Time   CHOL 202 (H) 03/29/2019 0425   TRIG 497 (H) 03/29/2019 0425   HDL 41 03/29/2019 0425   CHOLHDL 4.9 03/29/2019 0425   VLDL UNABLE TO CALCULATE IF TRIGLYCERIDE OVER 400 mg/dL 03/29/2019 0425   LDLCALC UNABLE TO CALCULATE IF TRIGLYCERIDE OVER 400 mg/dL 03/29/2019 0425   HgbA1c:  Lab Results  Component Value Date   HGBA1C 8.8 (H) 03/28/2019   Urine Drug Screen:     Component Value Date/Time   LABOPIA NONE DETECTED 03/28/2019 1255   COCAINSCRNUR NONE DETECTED 03/28/2019 1255   LABBENZ NONE DETECTED 03/28/2019 1255   AMPHETMU NONE DETECTED 03/28/2019 1255   THCU NONE DETECTED 03/28/2019 1255   LABBARB NONE DETECTED 03/28/2019 1255    Alcohol Level     Component Value Date/Time   ETH <10 03/28/2019 1144    IMAGING Ct Angio Head W Or Wo Contrast  Result Date: 03/28/2019 CLINICAL DATA:   Code stroke. Right arm and leg weakness. Last seen normal 0930 hours EXAM: CT ANGIOGRAPHY HEAD AND NECK TECHNIQUE: Multidetector CT imaging of the head and neck was performed using the standard protocol during bolus administration of intravenous contrast. Multiplanar CT image reconstructions and MIPs were obtained to evaluate the vascular anatomy. Carotid stenosis measurements (when applicable) are obtained utilizing NASCET criteria, using the distal internal carotid diameter as the denominator. CONTRAST:  74mL OMNIPAQUE IOHEXOL 350 MG/ML SOLN COMPARISON:  CT earlier same day FINDINGS: CTA NECK FINDINGS Aortic arch: Minimal atherosclerotic plaque. No aneurysm or dissection. Right carotid system: Common carotid artery widely patent to the bifurcation. Carotid bifurcation is normal without soft or calcified plaque. Left carotid system: Left common carotid artery widely patent to the bifurcation. Carotid bifurcation normal without soft or calcified plaque. Vertebral arteries: Both widely patent. Left vertebral artery arises at the arch. Skeleton: Ordinary mild degenerative changes of the facet joints. Other neck: No mass or lymphadenopathy. Upper chest: Negative Review of the MIP images confirms the above findings CTA HEAD FINDINGS Anterior circulation: Both internal carotid arteries are widely patent through the skull base and siphon regions.  No siphon stenosis. The anterior and middle cerebral vessels on the right are widely patent and normal. On the left, there is stenosis of the M1 segment estimated at 70%. This extends over a length of about 3-4 mm and is probably a chronic eccentric stenosis. Beyond that, no large or medium vessel occlusion is identified. Posterior circulation: Both vertebral arteries are widely patent to the basilar. No basilar stenosis. Posterior circulation branch vessels are normal. Venous sinuses: Patent and normal. Anatomic variants: None significant. Delayed phase: No abnormal enhancement.  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 I favor that this is a chronic stenosis rather than evidence of an embolus. No distal vessel occlusion is identified. These results were communicated by text message at the time of interpretation on 03/28/2019 at 12:14 pm to Dr. Roland Rack. Electronically Signed   By: Nelson Chimes M.D.   On: 03/28/2019 12:19   Ct Angio Neck W Or Wo Contrast  Result Date: 03/28/2019 CLINICAL DATA:  Code stroke. Right arm and leg weakness. Last seen normal 0930 hours EXAM: CT ANGIOGRAPHY HEAD AND NECK TECHNIQUE: Multidetector CT imaging of the head and neck was performed using the standard protocol during bolus administration of intravenous contrast. Multiplanar CT image reconstructions and MIPs were obtained to evaluate the vascular anatomy. Carotid stenosis measurements (when applicable) are obtained utilizing NASCET criteria, using the distal internal carotid diameter as the denominator. CONTRAST:  61mL OMNIPAQUE IOHEXOL 350 MG/ML SOLN COMPARISON:  CT earlier same day FINDINGS: CTA NECK FINDINGS Aortic arch: Minimal atherosclerotic plaque. No aneurysm or dissection. Right carotid system: Common carotid artery widely patent to the bifurcation. Carotid bifurcation is normal without soft or calcified plaque. Left carotid system: Left common carotid artery widely patent to the bifurcation. Carotid bifurcation normal without soft or calcified plaque. Vertebral arteries: Both widely patent. Left vertebral artery arises at the arch. Skeleton: Ordinary mild degenerative changes of the facet joints. Other neck: No mass or lymphadenopathy. Upper chest: Negative Review of the MIP images confirms the above findings CTA HEAD FINDINGS Anterior circulation: Both internal carotid arteries are widely patent through the skull base and siphon regions. No siphon stenosis. The anterior and middle cerebral  vessels on the right are widely patent and normal. On the left, there is stenosis of the M1 segment estimated at 70%. This extends over a length of about 3-4 mm and is probably a chronic eccentric stenosis. Beyond that, no large or medium vessel occlusion is identified. Posterior circulation: Both vertebral arteries are widely patent to the basilar. No basilar stenosis. Posterior circulation branch vessels are normal. Venous sinuses: Patent and normal. Anatomic variants: None significant. Delayed phase: No abnormal enhancement. 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 I favor that this is a chronic stenosis rather than evidence of an embolus. No distal vessel occlusion is identified. These results were communicated by text message at the time of interpretation on 03/28/2019 at 12:14 pm to Dr. Roland Rack. Electronically Signed   By: Nelson Chimes M.D.   On: 03/28/2019 12:19   Mr Jeri Cos GB Contrast  Result Date: 03/28/2019 CLINICAL DATA:  Right arm and leg weakness lasting 30 minutes this morning. EXAM: MRI HEAD WITHOUT AND WITH CONTRAST TECHNIQUE: Multiplanar, multiecho pulse sequences of the brain and surrounding structures were obtained without and with intravenous  contrast. CONTRAST:  7 mL Gadavist COMPARISON:  Head CT 03/28/2019 FINDINGS: Brain: There are predominantly punctate foci of acute cortical infarction involving the lateral aspects of the left temporal, occipital, and parietal lobes. No intracranial hemorrhage, midline shift, or extra-axial fluid collection is identified. There is a small chronic infarct involving the left basal ganglia and corona radiata with mild ex vacuo dilatation of the body of the left lateral ventricle. Dedicated pituitary protocol imaging was performed. There is a predominantly homogeneously enhancing mass filling and expanding the sella turcica which measures 4.8 x 2.0 x  2.8 cm. The mass extends into the suprasellar cistern and left cavernous sinus. There is mild extension anteriorly on the right along the planum sphenoidale, and there is more notable extension posterolaterally on the left with mild mass effect on the left cerebral peduncle and mesial left temporal lobe. There is dilatation of the temporal horn of the left lateral ventricle and/or a separate small cystic area/possible encephalomalacia along the lateral aspect of the mass in the mesial temporal lobe. The mass abuts the anterior aspect of the optic chiasm and the pre chiasmatic optic nerves without frank compression although the nerve is laterally displaced on the right. No normal pituitary gland is identified. Vascular: Major intracranial vascular flow voids are preserved. Skull and upper cervical spine: Unremarkable bone marrow signal. Sinuses/Orbits: Unremarkable orbits. Minimal right maxillary sinus mucosal thickening. Clear mastoid air cells. Other: None. IMPRESSION: 1. Small acute cortical infarcts in the left temporal, occipital, and parietal lobes. 2. Chronic left basal ganglia infarct. 3. 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 Electronically Signed   By: Logan Bores M.D.   On: 03/28/2019 18:25   Ct Head Code Stroke Wo Contrast  Result Date: 03/28/2019 CLINICAL DATA:  Code stroke. Right-sided arm and leg weakness. Last seen normal 0930 hours EXAM: CT HEAD WITHOUT CONTRAST TECHNIQUE: Contiguous axial images were obtained from the base of the skull through the vertex without intravenous contrast. COMPARISON:  None. FINDINGS: Brain: No acute infarction is identified. 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 There is an old infarction affecting the left basal ganglia and radiating white matter tracts per no evidence of intra-axial mass lesion, hemorrhage, hydrocephalus or extra-axial  collection. 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 Vascular: No abnormal vascular finding. Skull: Negative Sinuses/Orbits: Clear/normal Other: None ASPECTS ([place] Stroke Program Early CT Score) - Ganglionic level infarction (caudate, lentiform nuclei, internal capsule, insula, M1-M3 cortex): 7, allowing that the basal ganglia infarction is old. - Supraganglionic infarction (M4-M6 cortex): 3 Total score (0-10 with 10 being normal): 10 IMPRESSION: 1. No acute finding by CT. 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 2. Sellar/suprasellar mass.  MRI recommended for further evaluation. 3. ASPECTS is 10, allowing that the left basal ganglia infarction is old. 4. These results were communicated to Dr. Leonel Ramsay at [place] 4/13/2020by text page via the [facility] messaging system. Electronically Signed   By: Nelson Chimes M.D.   On: 03/28/2019 12:06   2D Echocardiogram   1. The left ventricle has normal systolic function with an ejection fraction of 60-65%. The cavity size was normal. There is mild concentric left ventricular hypertrophy. Left ventricular diastolic function could not be evaluated due to nondiagnostic  images.  2. The right ventricle has normal systolic function. The cavity was normal. There is no increase in right ventricular wall thickness. Right ventricular systolic pressure could not be assessed.  3. The aortic valve is grossly normal. Aortic valve regurgitation was not assessed by color flow Doppler.   PHYSICAL EXAM Pleasant middle-age Hispanic lady not in distress. . Afebrile. Head is nontraumatic. Neck is supple without bruit.    Cardiac exam no murmur or gallop. Lungs are clear to auscultation. Distal pulses are well felt. Neurological Exam ; Spanish language interpreter used at the  bedside to facilitate exam. Awake  Alert oriented x 3. Normal speech and language.eye movements full without nystagmus.fundi were not visualized. Vision acuity and fields appear normal. Hearing is normal. Palatal movements are normal. Face symmetric. Tongue midline. Normal strength, tone, reflexes and coordination. Normal sensation. Gait deferred. ASSESSMENT/PLAN Ms. Marrion Finan Karle Plumber is a 63 y.o. female with history of DB and HTN presenting with R arm and leg weakness.   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 Sellar/suprasellar mass. Old L BG infarct. ASPECTS 10.     CTA head & neck 70% stenosis L M1 for 3-76mm, felt to be chronic  MRI  Small L temporal, occipital and parietal lobe infarcts. Old L BG infarct. 4.8cm sellar/suprasellar mass   2D Echo EF 60-65%. No source of embolus   LDL 94  HgbA1c 8.8  Lovenox 40 mg sq daily for VTE prophylaxis  No antithrombotic prior to admission, loaded with aspirin and plavix. Given TIA mild stroke, recommend aspirin 81 mg and plavix 75 mg daily x 3 weeks, then aspirin alone.   Therapy recommendations:  No needs  Disposition:  Return home  Intracranial mass  Sellar/suprasellar mass invading L cavernous sinus and L sylvian cistern  Neurosurgery consulted  pituitary hormone panel pending  If prolactin significantly elevated, good candidate for bromocriptine tx  If prolactin not significantly elevated, OP f/u  Hypertension  Stable . Permissive hypertension (OK if < 220/120) but gradually normalize in 5-7 days . Long-term BP goal normotensive  Hyperlipidemia  Home meds:  No statin  LDL 94, goal < 70  Now on lipitor 40  Continue statin at discharge  Diabetes type II Uncontrolled  HgbA1c 8.8, goal < 7.0  Other Stroke Risk Factors  Overweight, Body mass index is 28.43 kg/m., recommend weight loss, diet and  exercise as appropriate   Hospital day # 0  I have personally obtained history,examined this patient, reviewed notes, independently viewed imaging studies, participated in medical decision making and plan of care.ROS completed by me personally and pertinent positives fully documented  I have made any additions or clarifications directly to the above note.  She presented with right-sided weakness and numbness secondary to left MCA branch infarct likely due to intrinsic left MCA stenosis.  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  Recommend aspirin and Plavix for 3 months followed by aspirin alone and aggressive risk factor modification.  Patient will need elective outpatient follow-up with neurosurgery for pituitary tumor surgery.  Long discussion with the patient and answered questions.  Discussed with Dr. Tamsen Meek.  Greater than 50% time during this 35-minute visit was spent on counseling and coordination of care about her left hemispheric infarct, MCA stenosis and pituitary  tumor and answering questions  Antony Contras, MD Medical Director [place] Pager: [phone] 03/29/2019 4:35 PM   To contact Stroke Continuity provider, please refer to [email or web address] After hours, contact General Neurology

## 2019-03-29 NOTE — Evaluation (Signed)
  Speech Language Pathology Evaluation Patient Details Name: Cheryl Massey Name MRN: [id number] DOB: 17-Sep-1956 Today's Date: 03/29/2019 Time:  -     Problem List:  Patient Active Problem List   Diagnosis Date Noted  . TIA (transient ischemic attack) 03/28/2019  . Essential hypertension 03/28/2019  . Type 2 diabetes mellitus treated without insulin ([place]) 03/28/2019   Past Medical History:  Past Medical History:  Diagnosis Date  . Diabetes mellitus without complication ([place])   . Hypertension    Past Surgical History:  Past Surgical History:  Procedure Laterality Date  . ABDOMINAL HYSTERECTOMY    . APPENDECTOMY     HPI:  Cheryl Massey is a 63 y.o. female with medical history significant of HTN and DM presenting with right-sided weakness.  She feels weak and deviation on her left face and weakness of the right arm and leg.  Reports no difficulty swallowing or speaking.  MRI shows Small acute cortical infarcts in the left temporal, occipital, and parietal lobes.   Assessment / Plan / Recommendation Clinical Impression  Pt did not demonstrate any observable cognitive linguistic impairment during assessment. MOCA [place] version 7.1 administered, pt scored WNL. No SLP f/u needed will sign off.     SLP Assessment  SLP Recommendation/Assessment: Patient does not need any further Speech Lanaguage Pathology Services    Follow Up Recommendations       Frequency and Duration           SLP Evaluation Cognition  Overall Cognitive Status: Within Functional Limits for tasks assessed       Comprehension  Auditory Comprehension Overall Auditory Comprehension: Appears within functional limits for tasks assessed    Expression Verbal Expression Overall Verbal Expression: Appears within functional limits for tasks assessed   Oral / Motor  Oral Motor/Sensory Function Overall Oral Motor/Sensory Function: Within functional limits Motor Speech Overall  Motor Speech: Appears within functional limits for tasks assessed   GO                   Herbie Baltimore, MA CCC-SLP  Acute Rehabilitation Services Pager [phone] Office [phone]  Lynann Beaver 03/29/2019, 10:16 AM

## 2019-03-29 NOTE — Evaluation (Signed)
Occupational Therapy Evaluation Patient Details Name: Cheryl Massey MRN: [id number] DOB: 07-Sep-1956 Today's Date: 03/29/2019    History of Present Illness Patient is a 63 y/o female who presents with right sided weakness. Head CT-unremarkable for anything acute. Workup pending. Possible TIA. NIH:0. PMH includes HTN, DM.    Clinical Impression   Pt seen for OT evaluation. Pt demonstrates ability for basic ADL and IADL tasks. See PT note for additional information.  At this time, pt does not require any further OT follow up.     Follow Up Recommendations  No OT follow up    Equipment Recommendations       Recommendations for Other Services       Precautions / Restrictions Precautions Precautions: None Restrictions Weight Bearing Restrictions: No      Mobility Bed Mobility Overal bed mobility: Independent             General bed mobility comments: no assist needed from flat bed  Transfers Overall transfer level: Independent               General transfer comment: Stood from EOB without difficulty, no dizziness. Pt able to complete functional ambulation without difficulty from room into hall and back again no device    Balance Overall balance assessment: Independent Sitting-balance support: Feet supported;No upper extremity supported Sitting balance-Leahy Scale: Normal Sitting balance - Comments: Able to reach feet as well as reach items on bedside table set away from pt   Standing balance support: During functional activity Standing balance-Leahy Scale: Good Standing balance comment: No evidence of balance impairment during functional tasks                           ADL either performed or assessed with clinical judgement   ADL Overall ADL's : Independent                                       General ADL Comments: Pt demonstrates good UE functional use, balance and functional mobility to complete ADL's      Vision Baseline Vision/History: Wears glasses Wears Glasses: At all times Patient Visual Report: No change from baseline Vision Assessment?: Yes;No apparent visual deficits     Perception     Praxis      Pertinent Vitals/Pain Pain Assessment: No/denies pain     Hand Dominance     Extremity/Trunk Assessment Upper Extremity Assessment Upper Extremity Assessment: Overall WFL for tasks assessed   Lower Extremity Assessment Lower Extremity Assessment: Defer to PT evaluation       Communication Communication Communication: Interpreter utilized(Graciela used for interpreter)   Cognition Arousal/Alertness: Awake/alert Behavior During Therapy: WFL for tasks assessed/performed Overall Cognitive Status: Within Functional Limits for tasks assessed                                     General Comments       Exercises     Shoulder Instructions      Home Living Family/patient expects to be discharged to:: Private residence Living Arrangements: Spouse/significant other;Children Available Help at Discharge: Family;Available 24 hours/day Type of Home: House Home Access: Stairs to enter [employer] of Steps: 2-3 Entrance Stairs-Rails: Right Home Layout: One level     Bathroom Shower/Tub: Tub/shower unit  Bathroom Toilet: Standard Bathroom Accessibility: Yes              Prior Functioning/Environment Level of Independence: Independent        Comments: Cooks, cleans.         OT Problem List:        OT Treatment/Interventions:      OT Goals(Current goals can be found in the care plan section)    OT Frequency:     Barriers to D/C:            Co-evaluation              AM-PAC OT "6 Clicks" Daily Activity     Outcome Measure Help from another person eating meals?: None(would need set up in hospital due to IV ) Help from another person taking care of personal grooming?: None Help from another person toileting,  which includes using toliet, bedpan, or urinal?: None Help from another person bathing (including washing, rinsing, drying)?: None Help from another person to put on and taking off regular upper body clothing?: None Help from another person to put on and taking off regular lower body clothing?: None 6 Click Score: 24   End of Session    Activity Tolerance: Patient tolerated treatment well Patient left: in bed;with call bell/phone within reach;with bed alarm set  OT Visit Diagnosis: Muscle weakness (generalized) (M62.81)                Time: 9604-5409 OT Time Calculation (min): 26 min Charges:  OT General Charges $OT Visit: 1 Visit OT Evaluation $OT Eval Low Complexity: 1 Low OT Treatments $Self Care/Home Management : 23-37 mins    Quay Burow, OTR/L 03/29/2019, 10:29 AM

## 2019-03-29 NOTE — Consult Note (Signed)
Reason for Consult: Suprasellar mass Referring Physician: [name] is an 63 y.o. female.  HPI: 63 year old female admitted for probable TIA work-up.  Patient with brief history of transient right upper extremity paresthesias and weakness which spontaneously resolved.  No evidence of seizure activity.  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  Patient without history of headache.  She denies diplopia.  She denies visual change.  No symptoms of endocrinopathy.  Past Medical History:  Diagnosis Date  . Diabetes mellitus without complication ([place])   . Hypertension     Past Surgical History:  Procedure Laterality Date  . ABDOMINAL HYSTERECTOMY    . APPENDECTOMY      Family History  Problem Relation Age of Onset  . Hypertension Mother   . Hypertension Father     Social History:  reports that she has never smoked. She has never used smokeless tobacco. She reports that she does not drink alcohol or use drugs.  Allergies: No Known Allergies  Medications: I have reviewed the patient's current medications.  Results for orders placed or performed during the hospital encounter of 03/28/19 (from the past 48 hour(s))  Ethanol     Status: None   Collection Time: 03/28/19 11:44 AM  Result Value Ref Range   Alcohol, Ethyl (B) <10 <10 mg/dL    Comment: (NOTE) Lowest detectable limit for serum alcohol is 10 mg/dL. For medical purposes only. Performed at [facility], [place] [address], [place], [place] [address]   Protime-INR     Status: None   Collection Time: 03/28/19 11:44 AM  Result Value Ref Range   Prothrombin Time 12.8 11.4 - 15.2 seconds   INR 1.0 0.8 - 1.2    Comment: (NOTE) INR goal varies based on device and disease states. Performed at [facility], [place] [address], [place], [place] [address]   APTT     Status: None   Collection Time: 03/28/19 11:44 AM  Result Value Ref Range    aPTT 27 24 - 36 seconds    Comment: Performed at [facility] [address] [place], [place] [address]  CBC     Status: None   Collection Time: 03/28/19 11:44 AM  Result Value Ref Range   WBC 5.9 4.0 - 10.5 K/uL   RBC 4.50 3.87 - 5.11 MIL/uL   Hemoglobin 13.0 12.0 - 15.0 g/dL   HCT 39.8 36.0 - 46.0 %   MCV 88.4 80.0 - 100.0 fL   MCH 28.9 26.0 - 34.0 pg   MCHC 32.7 30.0 - 36.0 g/dL   RDW 12.4 11.5 - 15.5 %   Platelets 293 150 - 400 K/uL   nRBC 0.0 0.0 - 0.2 %    Comment: Performed at [facility], [place] [address], [place], [place] [address]  Differential     Status: None   Collection Time: 03/28/19 11:44 AM  Result Value Ref Range   Neutrophils Relative % 56 %   Neutro Abs 3.3 1.7 - 7.7 K/uL   Lymphocytes Relative 31 %   Lymphs Abs 1.8 0.7 - 4.0 K/uL   Monocytes Relative 9 %   Monocytes Absolute 0.5 0.1 - 1.0 K/uL   Eosinophils Relative 2 %   Eosinophils Absolute 0.1 0.0 - 0.5 K/uL   Basophils Relative 1 %   Basophils Absolute 0.1 0.0 - 0.1 K/uL   Immature Granulocytes 1 %   Abs Immature  Granulocytes 0.03 0.00 - 0.07 K/uL    Comment: Performed at [facility], [place] [address] [place], [place] [address]  Comprehensive metabolic panel     Status: Abnormal   Collection Time: 03/28/19 11:44 AM  Result Value Ref Range   Sodium 139 135 - 145 mmol/L   Potassium 3.8 3.5 - 5.1 mmol/L   Chloride 103 98 - 111 mmol/L   CO2 24 22 - 32 mmol/L   Glucose, Bld 215 (H) 70 - 99 mg/dL   BUN 12 8 - 23 mg/dL   Creatinine, Ser 0.81 0.44 - 1.00 mg/dL   Calcium 9.5 8.9 - 10.3 mg/dL   Total Protein 7.0 6.5 - 8.1 g/dL   Albumin 4.0 3.5 - 5.0 g/dL   AST 22 15 - 41 U/L   ALT 22 0 - 44 U/L   Alkaline Phosphatase 71 38 - 126 U/L   Total Bilirubin 0.5 0.3 - 1.2 mg/dL   GFR calc non Af Amer >60 >60 mL/min   GFR calc Af Amer >60 >60 mL/min   Anion gap 12 5 - 15    Comment: Performed at [facility] [address], [place], [place] [address]  I-stat Creatinine, ED      Status: None   Collection Time: 03/28/19 11:52 AM  Result Value Ref Range   Creatinine, Ser 0.80 0.44 - 1.00 mg/dL  Urine rapid drug screen (hosp performed)     Status: None   Collection Time: 03/28/19 12:55 PM  Result Value Ref Range   Opiates NONE DETECTED NONE DETECTED   Cocaine NONE DETECTED NONE DETECTED   Benzodiazepines NONE DETECTED NONE DETECTED   Amphetamines NONE DETECTED NONE DETECTED   Tetrahydrocannabinol NONE DETECTED NONE DETECTED   Barbiturates NONE DETECTED NONE DETECTED    Comment: (NOTE) DRUG SCREEN FOR MEDICAL PURPOSES ONLY.  IF CONFIRMATION IS NEEDED FOR ANY PURPOSE, NOTIFY LAB WITHIN 5 DAYS. LOWEST DETECTABLE LIMITS FOR URINE DRUG SCREEN Drug Class                     Cutoff (ng/mL) Amphetamine and metabolites    1000 Barbiturate and metabolites    200 Benzodiazepine                 416 Tricyclics and metabolites     300 Opiates and metabolites        300 Cocaine and metabolites        300 THC                            50 Performed at [facility], [place] [address], [place], [place] [address]   Urinalysis, Routine w reflex microscopic     Status: Abnormal   Collection Time: 03/28/19 12:55 PM  Result Value Ref Range   Color, Urine STRAW (A) YELLOW   APPearance CLEAR CLEAR   Specific Gravity, Urine 1.024 1.005 - 1.030   pH 6.0 5.0 - 8.0   Glucose, UA 50 (A) NEGATIVE mg/dL   Hgb urine dipstick NEGATIVE NEGATIVE   Bilirubin Urine NEGATIVE NEGATIVE   Ketones, ur NEGATIVE NEGATIVE mg/dL   Protein, ur NEGATIVE NEGATIVE mg/dL   Nitrite NEGATIVE NEGATIVE   Leukocytes,Ua NEGATIVE NEGATIVE    Comment: Performed at [facility] [address] [place], [place] [address]  Hemoglobin A1c     Status: Abnormal   Collection Time: 03/28/19  2:52 PM  Result Value  Ref Range   Hgb A1c MFr Bld 8.8 (H) 4.8 - 5.6 %    Comment: (NOTE)         Prediabetes: 5.7 - 6.4         Diabetes: >6.4         Glycemic control for adults with diabetes: <7.0    Mean  Plasma Glucose 206 mg/dL    Comment: (NOTE) Performed At: [facility] [address] [place], [place] [id number] Rush Farmer MD [id number]   HIV antibody (Routine Testing)     Status: None   Collection Time: 03/28/19  2:52 PM  Result Value Ref Range   HIV Screen 4th Generation wRfx Non Reactive Non Reactive    Comment: (NOTE) Performed At: [facility] [place], [place] [id number] Rush Farmer MD [id number]   Glucose, capillary     Status: None   Collection Time: 03/28/19  6:42 PM  Result Value Ref Range   Glucose-Capillary 91 70 - 99 mg/dL   Comment 1 Notify RN    Comment 2 Document in Chart   Glucose, capillary     Status: Abnormal   Collection Time: 03/28/19  9:03 PM  Result Value Ref Range   Glucose-Capillary 245 (H) 70 - 99 mg/dL  Lipid panel     Status: Abnormal   Collection Time: 03/29/19  4:25 AM  Result Value Ref Range   Cholesterol 202 (H) 0 - 200 mg/dL   Triglycerides 497 (H) <150 mg/dL   HDL 41 >40 mg/dL   Total CHOL/HDL Ratio 4.9 RATIO   VLDL UNABLE TO CALCULATE IF TRIGLYCERIDE OVER 400 mg/dL 0 - 40 mg/dL   LDL Cholesterol UNABLE TO CALCULATE IF TRIGLYCERIDE OVER 400 mg/dL 0 - 99 mg/dL    Comment:        Total Cholesterol/HDL:CHD Risk Coronary Heart Disease Risk Table                     Men   Women  1/2 Average Risk   3.4   3.3  Average Risk       5.0   4.4  2 X Average Risk   9.6   7.1  3 X Average Risk  23.4   11.0        Use the calculated Patient Ratio above and the CHD Risk Table to determine the patient's CHD Risk.        ATP III CLASSIFICATION (LDL):  <100     mg/dL   Optimal  100-129  mg/dL   Near or Above                    Optimal  130-159  mg/dL   Borderline  160-189  mg/dL   High  >190     mg/dL   Very High Performed at [facility] [address], [place], [place] [address]   LDL cholesterol, direct     Status: None   Collection Time: 03/29/19  4:25 AM  Result Value Ref Range    Direct LDL 92.9 0 - 99 mg/dL    Comment: Performed at [facility] [address], [place], [place] [address]  Glucose, capillary     Status: None   Collection Time: 03/29/19  6:04 AM  Result Value Ref Range   Glucose-Capillary 92 70 - 99 mg/dL  TSH     Status: None   Collection Time: 03/29/19  8:18 AM  Result Value Ref  Range   TSH 1.136 0.350 - 4.500 uIU/mL    Comment: Performed by a 3rd Generation assay with a functional sensitivity of <=0.01 uIU/mL. Performed at [facility], [place] [address], [place], [place] [address]   Glucose, capillary     Status: Abnormal   Collection Time: 03/29/19  9:42 AM  Result Value Ref Range   Glucose-Capillary 100 (H) 70 - 99 mg/dL  Glucose, capillary     Status: Abnormal   Collection Time: 03/29/19  9:43 AM  Result Value Ref Range   Glucose-Capillary 108 (H) 70 - 99 mg/dL  Glucose, capillary     Status: Abnormal   Collection Time: 03/29/19 11:53 AM  Result Value Ref Range   Glucose-Capillary 129 (H) 70 - 99 mg/dL    Ct Angio Head W Or Wo Contrast  Result Date: 03/28/2019 CLINICAL DATA:  Code stroke. Right arm and leg weakness. Last seen normal 0930 hours EXAM: CT ANGIOGRAPHY HEAD AND NECK TECHNIQUE: Multidetector CT imaging of the head and neck was performed using the standard protocol during bolus administration of intravenous contrast. Multiplanar CT image reconstructions and MIPs were obtained to evaluate the vascular anatomy. Carotid stenosis measurements (when applicable) are obtained utilizing NASCET criteria, using the distal internal carotid diameter as the denominator. CONTRAST:  81mL OMNIPAQUE IOHEXOL 350 MG/ML SOLN COMPARISON:  CT earlier same day FINDINGS: CTA NECK FINDINGS Aortic arch: Minimal atherosclerotic plaque. No aneurysm or dissection. Right carotid system: Common carotid artery widely patent to the bifurcation. Carotid bifurcation is normal without soft or calcified plaque. Left carotid system: Left common carotid artery  widely patent to the bifurcation. Carotid bifurcation normal without soft or calcified plaque. Vertebral arteries: Both widely patent. Left vertebral artery arises at the arch. Skeleton: Ordinary mild degenerative changes of the facet joints. Other neck: No mass or lymphadenopathy. Upper chest: Negative Review of the MIP images confirms the above findings CTA HEAD FINDINGS Anterior circulation: Both internal carotid arteries are widely patent through the skull base and siphon regions. No siphon stenosis. The anterior and middle cerebral vessels on the right are widely patent and normal. On the left, there is stenosis of the M1 segment estimated at 70%. This extends over a length of about 3-4 mm and is probably a chronic eccentric stenosis. Beyond that, no large or medium vessel occlusion is identified. Posterior circulation: Both vertebral arteries are widely patent to the basilar. No basilar stenosis. Posterior circulation branch vessels are normal. Venous sinuses: Patent and normal. Anatomic variants: None significant. Delayed phase: No abnormal enhancement. 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 I favor that this is a chronic stenosis rather than evidence of an embolus. No distal vessel occlusion is identified. These results were communicated by text message at the time of interpretation on 03/28/2019 at 12:14 pm to Dr. Roland Rack. Electronically Signed   By: Nelson Chimes M.D.   On: 03/28/2019 12:19   Ct Angio Neck W Or Wo Contrast  Result Date: 03/28/2019 CLINICAL DATA:  Code stroke. Right arm and leg weakness. Last seen normal 0930 hours EXAM: CT ANGIOGRAPHY HEAD AND NECK TECHNIQUE: Multidetector CT imaging of the head and neck was performed using the standard protocol during bolus administration of intravenous contrast. Multiplanar CT image reconstructions and MIPs were obtained to evaluate the  vascular anatomy. Carotid stenosis measurements (when applicable) are obtained utilizing NASCET criteria, using the distal internal carotid diameter  as the denominator. CONTRAST:  18mL OMNIPAQUE IOHEXOL 350 MG/ML SOLN COMPARISON:  CT earlier same day FINDINGS: CTA NECK FINDINGS Aortic arch: Minimal atherosclerotic plaque. No aneurysm or dissection. Right carotid system: Common carotid artery widely patent to the bifurcation. Carotid bifurcation is normal without soft or calcified plaque. Left carotid system: Left common carotid artery widely patent to the bifurcation. Carotid bifurcation normal without soft or calcified plaque. Vertebral arteries: Both widely patent. Left vertebral artery arises at the arch. Skeleton: Ordinary mild degenerative changes of the facet joints. Other neck: No mass or lymphadenopathy. Upper chest: Negative Review of the MIP images confirms the above findings CTA HEAD FINDINGS Anterior circulation: Both internal carotid arteries are widely patent through the skull base and siphon regions. No siphon stenosis. The anterior and middle cerebral vessels on the right are widely patent and normal. On the left, there is stenosis of the M1 segment estimated at 70%. This extends over a length of about 3-4 mm and is probably a chronic eccentric stenosis. Beyond that, no large or medium vessel occlusion is identified. Posterior circulation: Both vertebral arteries are widely patent to the basilar. No basilar stenosis. Posterior circulation branch vessels are normal. Venous sinuses: Patent and normal. Anatomic variants: None significant. Delayed phase: No abnormal enhancement. 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 I favor that this is a chronic stenosis rather than evidence of an embolus. No distal vessel occlusion is identified. These results were communicated by text message at the time of  interpretation on 03/28/2019 at 12:14 pm to Dr. Roland Rack. Electronically Signed   By: Nelson Chimes M.D.   On: 03/28/2019 12:19   Mr Jeri Cos ZS Contrast  Result Date: 03/28/2019 CLINICAL DATA:  Right arm and leg weakness lasting 30 minutes this morning. EXAM: MRI HEAD WITHOUT AND WITH CONTRAST TECHNIQUE: Multiplanar, multiecho pulse sequences of the brain and surrounding structures were obtained without and with intravenous contrast. CONTRAST:  7 mL Gadavist COMPARISON:  Head CT 03/28/2019 FINDINGS: Brain: There are predominantly punctate foci of acute cortical infarction involving the lateral aspects of the left temporal, occipital, and parietal lobes. No intracranial hemorrhage, midline shift, or extra-axial fluid collection is identified. There is a small chronic infarct involving the left basal ganglia and corona radiata with mild ex vacuo dilatation of the body of the left lateral ventricle. Dedicated pituitary protocol imaging was performed. There is a predominantly homogeneously enhancing mass filling and expanding the sella turcica which measures 4.8 x 2.0 x 2.8 cm. The mass extends into the suprasellar cistern and left cavernous sinus. There is mild extension anteriorly on the right along the planum sphenoidale, and there is more notable extension posterolaterally on the left with mild mass effect on the left cerebral peduncle and mesial left temporal lobe. There is dilatation of the temporal horn of the left lateral ventricle and/or a separate small cystic area/possible encephalomalacia along the lateral aspect of the mass in the mesial temporal lobe. The mass abuts the anterior aspect of the optic chiasm and the pre chiasmatic optic nerves without frank compression although the nerve is laterally displaced on the right. No normal pituitary gland is identified. Vascular: Major intracranial vascular flow voids are preserved. Skull and upper cervical spine: Unremarkable bone marrow signal.  Sinuses/Orbits: Unremarkable orbits. Minimal right maxillary sinus mucosal thickening. Clear mastoid air cells. Other: None. IMPRESSION: 1. Small acute cortical infarcts in the left temporal,  occipital, and parietal lobes. 2. Chronic left basal ganglia infarct. 3. 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 Electronically Signed   By: Logan Bores M.D.   On: 03/28/2019 18:25   Ct Head Code Stroke Wo Contrast  Result Date: 03/28/2019 CLINICAL DATA:  Code stroke. Right-sided arm and leg weakness. Last seen normal 0930 hours EXAM: CT HEAD WITHOUT CONTRAST TECHNIQUE: Contiguous axial images were obtained from the base of the skull through the vertex without intravenous contrast. COMPARISON:  None. FINDINGS: Brain: No acute infarction is identified. 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 There is an old infarction affecting the left basal ganglia and radiating white matter tracts per no evidence of intra-axial mass lesion, hemorrhage, hydrocephalus or extra-axial collection. 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 Vascular: No abnormal vascular finding. Skull: Negative Sinuses/Orbits: Clear/normal Other: None ASPECTS ([place] Stroke Program Early CT Score) - Ganglionic level infarction (caudate, lentiform nuclei, internal capsule, insula, M1-M3 cortex): 7, allowing that the basal ganglia infarction is old. - Supraganglionic infarction (M4-M6 cortex): 3 Total score (0-10 with 10 being normal): 10 IMPRESSION: 1. No acute finding by CT. 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 2. Sellar/suprasellar mass.  MRI recommended for further evaluation. 3. ASPECTS is 10, allowing that the left basal ganglia  infarction is old. 4. These results were communicated to Dr. Leonel Ramsay at [facility] 4/13/2020by text page via the [facility] messaging system. Electronically Signed   By: Nelson Chimes M.D.   On: 03/28/2019 12:06    Pertinent items noted in HPI and remainder of comprehensive ROS otherwise negative. Blood pressure (!) 147/85, pulse 70, temperature 98.6 F (37 C), temperature source Oral, resp. rate 15, height 5\' 3"  (1.6 m), weight 72.8 kg, SpO2 98 %. The patient is awake and alert.  She is oriented and appropriate.  She appears to be in no distress.  Her speech is fluent.  Her judgment and insight are intact.  She speaks only [place].  Visual acuity and visual fields appear intact.  Extraocular movements are full.  Facial movement and sensation normal bilaterally.  Tongue protrudes the midline.  Motor and sensory examination of the extremities normal.  No pronator drift.  Examination head ears eyes nose throat is unremarked.  Chest and abdomen are benign.  Extremities are free from injury or deformity.  Assessment/Plan: Likely incidentally discovered sellar/suprasellar mass with invasion of her left cavernous sinus and extension into her left sylvian cistern.  Patient's visual apparatus appears unaffected at present.  Awaiting full endocrinologic lab panel.  Most importantly her prolactin level will be instrumental in further treatment plans.  If her prolactin level is significantly elevated (not just from stalk effect) then she would be a good candidate for medical management with bromocriptine.  No indication for urgent surgical intervention.  If patient's stroke work-up is negative and patient felt to be appropriate for discharge the patient can be discharged home and would need follow-up with my partner-Dr. Venetia Constable for further evaluation of treatment options.  Cheryl Massey 03/29/2019, 3:00 PM

## 2019-03-29 NOTE — Progress Notes (Signed)
PROGRESS NOTE    Cheryl Massey  KJZ:[id number] DOB: 1956-05-12 DOA: 03/28/2019 PCP: Patient, No Pcp Per   Brief Narrative: Patient is a 63 year old Hispanic female with history of hypertension, diabetes who presented from home with complaints of right-sided weakness.  She felt weak and said she had deviation on her left face and weakness of the right arm and leg.  Head CT was negative.  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  Assessment & Plan:   Principal Problem:   TIA (transient ischemic attack) Active Problems:   Essential hypertension   Type 2 diabetes mellitus treated without insulin (HCC)   Acute ischemic stroke:MRI of the brain showed small acute cortical infarcts in the left temporal, occipital, and parietal lobes.  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  Echocardiogram showed left ventricle ejection fraction of 60 to 65%, mild LVH.  Patient seen by neurology.  Recommend aspirin Plavix for 21 days followed by aspirin alone.  She will follow-up with neurology as an outpatient after discharge.  She has mild right lower extremity weakness otherwise no focal neurological deficits.  She has been seen by PT/OT/speech and cleared and no follow-up recommended. Started on Lipitor 40 mg daily.  LDL was not calculated due to high triglyceride.  Follow-up lipid panel tomorrow.  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  New finding.  Neurosurgery already consulted.  She does not have any visual deficits.  Denies any headache. I have sent for prolactin and growth hormone.  Levels pending.  Needs to be seen by neurosurgery before discharge planning.  Diabetes mellitus: Hemoglobin A1c 8.8.   Glipizide-metformin combination at home.  She says she is compliant with her medication.  She does not have any primary care physician.  She said she had a doctor in [place] and saw the doctor about 8 months ago.  She needs to follow-up with the PCP here.  Case manager already arranged a follow-up. She can continue her home meds on discharge( can consider increasing dose).  Follow-up hemoglobin A1c in 3 months .  Hypertension: Not on medication at home.  Allow permissive hypertension for now.  Gradually normalize blood pressure in 5 to 7 days.  Use PRN meds for blood pressure more than 948 mmHg systolic or more than 016 mmHg diastolic.  She should be on antihypertensives on discharge.  Nutrition Problem: Increased nutrient needs Etiology: acute illness      DVT prophylaxis: Lovenox Code Status: Full Family Communication: None  present at the bedside. Disposition Plan: Home after neurosurgery clearance, pending hormone levels   Consultants: Neurosurgery, neurology  Procedures: Echocardiogram, MRI  Antimicrobials:  Anti-infectives (From admission, onward)   None      Subjective: Patient seen and examined the bedside this morning.  Mildly hypertensive.  Spanish-speaking so interpreter was used.  Feels comfortable at the moment.  Denies any complaints.  No focal neurological deficits.  Speech is clear.  Objective: Vitals:   03/29/19 0345 03/29/19 0851 03/29/19 0851 03/29/19 1242  BP: (!) 143/82 (!) 169/74 (!) 169/74 (!) 147/85  Pulse: (!) 57 (!) 54 (!) 54 70  Resp: 16 16 16 15   Temp: 97.6 F (36.4 C) 98 F (36.7 C) 98 F (36.7 C) 98.6 F (37 C)  TempSrc:  Oral Oral Oral Oral  SpO2: 98% 99% 98% 98%  Weight:      Height:        Intake/Output Summary (Last 24 hours) at 03/29/2019 1418 Last data filed at 03/29/2019 0900 Gross per 24 hour  Intake 838.55 ml  Output --  Net 838.55 ml   Filed Weights   03/28/19 1136  Weight: 72.8 kg    Examination:  General exam: Appears  calm and comfortable ,Not in distress,average built HEENT:PERRL,Oral mucosa moist, Ear/Nose normal on gross exam Respiratory system: Bilateral equal air entry, normal vesicular breath sounds, no wheezes or crackles  Cardiovascular system: S1 & S2 heard, RRR. No JVD, murmurs, rubs, gallops or clicks. No pedal edema. Gastrointestinal system: Abdomen is nondistended, soft and nontender. No organomegaly or masses felt. Normal bowel sounds heard. Central nervous system: Alert and oriented. Mild weakness on the right lower extremity  extremities: No edema, no clubbing ,no cyanosis, distal peripheral pulses palpable. Skin: No rashes, lesions or ulcers,no icterus ,no pallor MSK: Normal muscle bulk,tone ,power Psychiatry: Judgement and insight appear normal. Mood & affect appropriate.     Data Reviewed: I have personally reviewed following labs and imaging studies  CBC: Recent Labs  Lab 03/28/19 1144  WBC 5.9  NEUTROABS 3.3  HGB 13.0  HCT 39.8  MCV 88.4  PLT 353   Basic Metabolic Panel: Recent Labs  Lab 03/28/19 1144 03/28/19 1152  NA 139  --   K 3.8  --   CL 103  --   CO2 24  --   GLUCOSE 215*  --   BUN 12  --   CREATININE 0.81 0.80  CALCIUM 9.5  --    GFR: Estimated Creatinine Clearance: 69.8 mL/min (by C-G formula based on SCr of 0.8 mg/dL). Liver Function Tests: Recent Labs  Lab 03/28/19 1144  AST 22  ALT 22  ALKPHOS 71  BILITOT 0.5  PROT 7.0  ALBUMIN 4.0   No results for input(s): LIPASE, AMYLASE in the last 168 hours. No results for input(s): AMMONIA in the last 168 hours. Coagulation Profile: Recent Labs  Lab 03/28/19 1144  INR 1.0   Cardiac Enzymes: No results for input(s): CKTOTAL, CKMB, CKMBINDEX, TROPONINI in the last 168 hours. BNP (last 3 results) No results for input(s): PROBNP in the last 8760 hours. HbA1C: Recent Labs    03/28/19 1452  HGBA1C 8.8*   CBG: Recent Labs  Lab 03/28/19 2103 03/29/19 0604 03/29/19 0942 03/29/19 0943  03/29/19 1153  GLUCAP 245* 92 100* 108* 129*   Lipid Profile: Recent Labs    03/29/19 0425  CHOL 202*  HDL 41  LDLCALC UNABLE TO CALCULATE IF TRIGLYCERIDE OVER 400 mg/dL  TRIG 497*  CHOLHDL 4.9  LDLDIRECT 92.9   Thyroid Function Tests: Recent Labs    03/29/19 0818  TSH 1.136   Anemia Panel: No results for input(s): VITAMINB12, FOLATE, FERRITIN, TIBC, IRON, RETICCTPCT in the last 72 hours. Sepsis Labs: No results for input(s): PROCALCITON, LATICACIDVEN in the last 168 hours.  No results found for this or any previous visit (from the past 240 hour(s)).       Radiology Studies: Ct Angio Head W Or Wo Contrast  Result Date: 03/28/2019 CLINICAL DATA:  Code stroke. Right arm and leg weakness. Last seen normal 0930 hours EXAM: CT ANGIOGRAPHY HEAD AND NECK TECHNIQUE: Multidetector CT imaging of the head and neck was performed using the standard protocol during bolus administration of intravenous contrast. Multiplanar CT image reconstructions and MIPs were obtained  to evaluate the vascular anatomy. Carotid stenosis measurements (when applicable) are obtained utilizing NASCET criteria, using the distal internal carotid diameter as the denominator. CONTRAST:  68mL OMNIPAQUE IOHEXOL 350 MG/ML SOLN COMPARISON:  CT earlier same day FINDINGS: CTA NECK FINDINGS Aortic arch: Minimal atherosclerotic plaque. No aneurysm or dissection. Right carotid system: Common carotid artery widely patent to the bifurcation. Carotid bifurcation is normal without soft or calcified plaque. Left carotid system: Left common carotid artery widely patent to the bifurcation. Carotid bifurcation normal without soft or calcified plaque. Vertebral arteries: Both widely patent. Left vertebral artery arises at the arch. Skeleton: Ordinary mild degenerative changes of the facet joints. Other neck: No mass or lymphadenopathy. Upper chest: Negative Review of the MIP images confirms the above findings CTA HEAD FINDINGS Anterior  circulation: Both internal carotid arteries are widely patent through the skull base and siphon regions. No siphon stenosis. The anterior and middle cerebral vessels on the right are widely patent and normal. On the left, there is stenosis of the M1 segment estimated at 70%. This extends over a length of about 3-4 mm and is probably a chronic eccentric stenosis. Beyond that, no large or medium vessel occlusion is identified. Posterior circulation: Both vertebral arteries are widely patent to the basilar. No basilar stenosis. Posterior circulation branch vessels are normal. Venous sinuses: Patent and normal. Anatomic variants: None significant. Delayed phase: No abnormal enhancement. 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 I favor that this is a chronic stenosis rather than evidence of an embolus. No distal vessel occlusion is identified. These results were communicated by text message at the time of interpretation on 03/28/2019 at 12:14 pm to Dr. Roland Rack. Electronically Signed   By: Nelson Chimes M.D.   On: 03/28/2019 12:19   Ct Angio Neck W Or Wo Contrast  Result Date: 03/28/2019 CLINICAL DATA:  Code stroke. Right arm and leg weakness. Last seen normal 0930 hours EXAM: CT ANGIOGRAPHY HEAD AND NECK TECHNIQUE: Multidetector CT imaging of the head and neck was performed using the standard protocol during bolus administration of intravenous contrast. Multiplanar CT image reconstructions and MIPs were obtained to evaluate the vascular anatomy. Carotid stenosis measurements (when applicable) are obtained utilizing NASCET criteria, using the distal internal carotid diameter as the denominator. CONTRAST:  57mL OMNIPAQUE IOHEXOL 350 MG/ML SOLN COMPARISON:  CT earlier same day FINDINGS: CTA NECK FINDINGS Aortic arch: Minimal atherosclerotic plaque. No aneurysm or dissection. Right carotid system: Common carotid  artery widely patent to the bifurcation. Carotid bifurcation is normal without soft or calcified plaque. Left carotid system: Left common carotid artery widely patent to the bifurcation. Carotid bifurcation normal without soft or calcified plaque. Vertebral arteries: Both widely patent. Left vertebral artery arises at the arch. Skeleton: Ordinary mild degenerative changes of the facet joints. Other neck: No mass or lymphadenopathy. Upper chest: Negative Review of the MIP images confirms the above findings CTA HEAD FINDINGS Anterior circulation: Both internal carotid arteries are widely patent through the skull base and siphon regions. No siphon stenosis. The anterior and middle cerebral vessels on the right are widely patent and normal. On the left, there is stenosis of the M1 segment estimated at 70%. This extends over a length of about 3-4 mm and is probably a chronic eccentric stenosis. Beyond that, no large or medium vessel occlusion is identified. Posterior circulation: Both vertebral arteries are widely patent to the basilar. No  basilar stenosis. Posterior circulation branch vessels are normal. Venous sinuses: Patent and normal. Anatomic variants: None significant. Delayed phase: No abnormal enhancement. 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 I favor that this is a chronic stenosis rather than evidence of an embolus. No distal vessel occlusion is identified. These results were communicated by text message at the time of interpretation on 03/28/2019 at 12:14 pm to Dr. Roland Rack. Electronically Signed   By: Nelson Chimes M.D.   On: 03/28/2019 12:19   Mr Jeri Cos GM Contrast  Result Date: 03/28/2019 CLINICAL DATA:  Right arm and leg weakness lasting 30 minutes this morning. EXAM: MRI HEAD WITHOUT AND WITH CONTRAST TECHNIQUE: Multiplanar, multiecho pulse sequences of the brain and surrounding structures  were obtained without and with intravenous contrast. CONTRAST:  7 mL Gadavist COMPARISON:  Head CT 03/28/2019 FINDINGS: Brain: There are predominantly punctate foci of acute cortical infarction involving the lateral aspects of the left temporal, occipital, and parietal lobes. No intracranial hemorrhage, midline shift, or extra-axial fluid collection is identified. There is a small chronic infarct involving the left basal ganglia and corona radiata with mild ex vacuo dilatation of the body of the left lateral ventricle. Dedicated pituitary protocol imaging was performed. There is a predominantly homogeneously enhancing mass filling and expanding the sella turcica which measures 4.8 x 2.0 x 2.8 cm. The mass extends into the suprasellar cistern and left cavernous sinus. There is mild extension anteriorly on the right along the planum sphenoidale, and there is more notable extension posterolaterally on the left with mild mass effect on the left cerebral peduncle and mesial left temporal lobe. There is dilatation of the temporal horn of the left lateral ventricle and/or a separate small cystic area/possible encephalomalacia along the lateral aspect of the mass in the mesial temporal lobe. The mass abuts the anterior aspect of the optic chiasm and the pre chiasmatic optic nerves without frank compression although the nerve is laterally displaced on the right. No normal pituitary gland is identified. Vascular: Major intracranial vascular flow voids are preserved. Skull and upper cervical spine: Unremarkable bone marrow signal. Sinuses/Orbits: Unremarkable orbits. Minimal right maxillary sinus mucosal thickening. Clear mastoid air cells. Other: None. IMPRESSION: 1. Small acute cortical infarcts in the left temporal, occipital, and parietal lobes. 2. Chronic left basal ganglia infarct. 3. 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 Electronically Signed   By: Logan Bores M.D.   On: 03/28/2019 18:25    Ct Head Code Stroke Wo Contrast  Result Date: 03/28/2019 CLINICAL DATA:  Code stroke. Right-sided arm and leg weakness. Last seen normal 0930 hours EXAM: CT HEAD WITHOUT CONTRAST TECHNIQUE: Contiguous axial images were obtained from the base of the skull through the vertex without intravenous contrast. COMPARISON:  None. FINDINGS: Brain: No acute infarction is identified. 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 There is an old infarction affecting the left basal ganglia and radiating white matter tracts per no evidence of intra-axial mass lesion, hemorrhage, hydrocephalus or extra-axial collection. 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 Vascular: No abnormal vascular finding. Skull: Negative Sinuses/Orbits: Clear/normal Other: None ASPECTS ([place] Stroke Program Early CT Score) - Ganglionic level infarction (caudate, lentiform  nuclei, internal capsule, insula, M1-M3 cortex): 7, allowing that the basal ganglia infarction is old. - Supraganglionic infarction (M4-M6 cortex): 3 Total score (0-10 with 10 being normal): 10 IMPRESSION: 1. No acute finding by CT. 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 2. Sellar/suprasellar mass.  MRI recommended for further evaluation. 3. ASPECTS is 10, allowing that the left basal ganglia infarction is old. 4. These results were communicated to Dr. Leonel Ramsay at [place] 4/13/2020by text page via the [facility] messaging system. Electronically Signed   By: Nelson Chimes M.D.   On: 03/28/2019 12:06        Scheduled Meds:  aspirin  300 mg Rectal Daily   Or   aspirin  325 mg Oral Daily   atorvastatin  40 mg Oral q1800   clopidogrel  300 mg Oral Once   clopidogrel  75  mg Oral Daily   enoxaparin (LOVENOX) injection  40 mg Subcutaneous Q24H   feeding supplement (GLUCERNA SHAKE)  237 mL Oral BID BM   insulin aspart  0-15 Units Subcutaneous TID WC   insulin aspart  0-5 Units Subcutaneous QHS   LORazepam  0.5 mg Intravenous Once   Continuous Infusions:   LOS: 0 days    Time spent: 35 mins.More than 50% of that time was spent in counseling and/or coordination of care.      Shelly Coss, MD [facility] Pager [phone]  If 7PM-7AM, please contact night-coverage [email or web address] Password [facility] 03/29/2019, 2:18 PM

## 2019-03-30 LAB — PROLACTIN: Prolactin: 29.7 ng/mL — ABNORMAL HIGH (ref 4.8–23.3)

## 2019-03-30 LAB — GROWTH HORMONE: Growth Hormone: 1.7 ng/mL (ref 0.0–10.0)

## 2019-04-11 ENCOUNTER — Other Ambulatory Visit: Payer: Self-pay

## 2019-04-11 ENCOUNTER — Ambulatory Visit: Payer: Self-pay | Attending: Nurse Practitioner | Admitting: Nurse Practitioner

## 2019-06-21 ENCOUNTER — Other Ambulatory Visit: Payer: Self-pay | Admitting: Internal Medicine

## 2019-06-21 DIAGNOSIS — Z20822 Contact with and (suspected) exposure to covid-19: Secondary | ICD-10-CM

## 2019-06-27 LAB — NOVEL CORONAVIRUS, NAA: SARS-CoV-2, NAA: DETECTED — AB

## 2019-10-04 ENCOUNTER — Other Ambulatory Visit: Payer: Self-pay

## 2019-10-04 ENCOUNTER — Encounter: Payer: Self-pay | Admitting: Adult Health

## 2019-10-04 ENCOUNTER — Ambulatory Visit: Payer: Self-pay | Admitting: Adult Health

## 2019-10-04 VITALS — BP 133/82 | HR 71 | Temp 97.8°F | Ht 64.0 in | Wt 149.6 lb

## 2019-10-04 DIAGNOSIS — I10 Essential (primary) hypertension: Secondary | ICD-10-CM

## 2019-10-04 DIAGNOSIS — I63512 Cerebral infarction due to unspecified occlusion or stenosis of left middle cerebral artery: Secondary | ICD-10-CM

## 2019-10-04 DIAGNOSIS — R413 Other amnesia: Secondary | ICD-10-CM

## 2019-10-04 DIAGNOSIS — E785 Hyperlipidemia, unspecified: Secondary | ICD-10-CM

## 2019-10-04 DIAGNOSIS — R29818 Other symptoms and signs involving the nervous system: Secondary | ICD-10-CM

## 2019-10-04 DIAGNOSIS — E119 Type 2 diabetes mellitus without complications: Secondary | ICD-10-CM

## 2019-10-04 DIAGNOSIS — G9389 Other specified disorders of brain: Secondary | ICD-10-CM

## 2019-10-04 NOTE — Progress Notes (Signed)
[facility] [address] [place]. [place] [address] [phone]       [place] Date of Birth:  October 14, 1956 Medical Record Number:  [id number]   Reason for Referral:  hospital stroke follow up    CHIEF COMPLAINT:  Chief Complaint  Patient presents with  . Follow-up    Treatment room, with daughter. " Headaches, forgetful. 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  History obtained from patient, daughter, Spanish interpreter and chart review. Reviewed all radiology images and labs personally.  [name] is a 63 y.o. female with history of DB and HTN  who presented on 03/28/2019 with R arm and leg weakness.  Stroke work-up revealed left MCA cortical infarcts as evidenced on MRI in setting of left M1 intrinsic stenosis versus unknown embolic source.  MRI showed small areas of infarct within left temporal, occipital and parietal lobe as well as old left BG infarcts.  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  2D echo normal EF without cardiac source of embolus identified.  LDL 94.  A1c 8.8.  Recommended DAPT for 3 months then aspirin alone.  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  HTN stable.  Initiated atorvastatin 40 mg daily for HLD management.  Uncontrolled DM and recommend close PCP follow-up.  Other stroke risk factors include prior stroke on imaging.  She was discharged home in stable condition.  [name] is being seen today for hospital follow-up accompanied by daughter and spanish interpreter.  Daughter provides majority of history with assistance of interpreter.  After hospital discharge, she was  experiencing generalized headache and memory loss with improvement.  Memory loss and headaches have worsened over the past 2 weeks.  When requested daughter provide examples of memory loss and cognitive concerns, she states she has greater difficulty trying to get out what is needed to be said with word finding difficulty.  Headaches have been present over left temporal and orbital area and have been present daily.  Denies visual changes, weakness, numbness/tingling or any other neurological changes.  Daughter also endorses decreased appetite with weight loss over the past few months.  It was recommended to follow-up with neurosurgery at hospital discharge but patient did not have this follow-up.  It was also recommended for her to follow-up with [facility] to establish care with PCP but this apparently was not done.  Unable to adequately determine exact medications patient is currently taking as initially endorse only being on diabetic medication (glipizide-Metformin) but then did state she was currently on aspirin 81 mg daily.  Per report, she is not currently taking atorvastatin and unable to determine if she was on Plavix for 96-month duration as recommended at discharge.  Difficulty with medication and appointment compliance due to lack of insurance and inability to pay for appointments, medications and testing.  Blood pressure today 133/82.  No further concerns at this time.      ROS:   14 system review of systems performed and negative with exception of memory loss, confusion, speech difficulty, headaches  PMH:  Past Medical History:  Diagnosis Date  . Diabetes mellitus without complication ([place])   . Hypertension  PSH:  Past Surgical History:  Procedure Laterality Date  . ABDOMINAL HYSTERECTOMY    . APPENDECTOMY      Social History:  Social History   Socioeconomic History  . Marital status: Married    Spouse name: Not on file  . Number of children: Not on file   . Years of education: Not on file  . Highest education level: Not on file  Occupational History  . Not on file  Social Needs  . Financial resource strain: Not on file  . Food insecurity    Worry: Not on file    Inability: Not on file  . Transportation needs    Medical: Not on file    Non-medical: Not on file  Tobacco Use  . Smoking status: Never Smoker  . Smokeless tobacco: Never Used  Substance and Sexual Activity  . Alcohol use: No  . Drug use: No  . Sexual activity: Yes    Birth control/protection: Surgical  Lifestyle  . Physical activity    Days per week: Not on file    Minutes per session: Not on file  . Stress: Not on file  Relationships  . Social Herbalist on phone: Not on file    Gets together: Not on file    Attends religious service: Not on file    Active member of club or organization: Not on file    Attends meetings of clubs or organizations: Not on file    Relationship status: Not on file  . Intimate partner violence    Fear of current or ex partner: Not on file    Emotionally abused: Not on file    Physically abused: Not on file    Forced sexual activity: Not on file  Other Topics Concern  . Not on file  Social History Narrative  . Not on file    Family History:  Family History  Problem Relation Age of Onset  . Hypertension Mother   . Hypertension Father     Medications:   Current Outpatient Medications on File Prior to Visit  Medication Sig Dispense Refill  . aspirin EC 81 MG EC tablet Take 1 tablet (81 mg total) by mouth daily. 30 tablet 0  . atorvastatin (LIPITOR) 40 MG tablet Take 1 tablet (40 mg total) by mouth daily at 6 PM. 30 tablet 0  . glipiZIDE-metformin (METAGLIP) 5-500 MG tablet Take 1 tablet by mouth 2 (two) times daily before a meal for 30 days. 60 tablet 0  . losartan (COZAAR) 25 MG tablet Take 1 tablet (25 mg total) by mouth daily for 30 days. 30 tablet 0   No current facility-administered medications on file prior  to visit.     Allergies:  No Known Allergies   Physical Exam  Vitals:   10/04/19 1304  BP: 133/82  Pulse: 71  Temp: 97.8 F (36.6 C)  Weight: 149 lb 9.6 oz (67.9 kg)  Height: 5\' 4"  (1.626 m)   Body mass index is 25.68 kg/m. No exam data present  No flowsheet data found.   General: well developed, well nourished, pleasant middle-aged Hispanic female, seated, in no evident distress Head: head normocephalic and atraumatic.   Neck: supple with no carotid or supraclavicular bruits Cardiovascular: regular rate and rhythm, no murmurs Musculoskeletal: no deformity Skin:  no rash/petichiae Vascular:  Normal pulses all extremities   Neurologic Exam Mental Status: Awake and fully alert.   Spanish-speaking therefore difficulty assessing possible dysarthria or aphasia but none noted  by interpreter.  Oriented to place and time. Recent and remote memory diminished.  Majority of history provided by daughter.  Mood and affect appropriate.  Cranial Nerves: Fundoscopic exam reveals sharp disc margins. Pupils equal, briskly reactive to light. Extraocular movements full without nystagmus. Visual fields full to confrontation. Hearing intact. Facial sensation intact. Face, tongue, palate moves normally and symmetrically.  Motor: Normal bulk and tone. Normal strength in all tested extremity muscles. Sensory.: intact to touch , pinprick , position and vibratory sensation.  Coordination: Rapid alternating movements normal in all extremities. Finger-to-nose and heel-to-shin performed accurately bilaterally. Gait and Station: Arises from chair without difficulty. Stance is normal. Gait demonstrates normal stride length and balance Reflexes: 1+ and symmetric. Toes downgoing.     NIHSS  0 Modified Rankin  2    Diagnostic Data (Labs, Imaging, Testing)  CT HEAD WO CONTRAST 03/28/2019 IMPRESSION: 1. No acute finding by CT. Apparent old infarctions of the left temporal lobe and left basal  ganglia/radiating white matter tracts. 2. Sellar/suprasellar mass.  MRI recommended for further evaluation. 3. ASPECTS is 10, allowing that the left basal ganglia infarction is old. 4. These results were communicated to Dr. Leonel Ramsay at [facility] 4/13/2020by text page via the [facility] messaging system.  CT ANGIO HEAD W OR WO CONTRAST CT ANGIO NECK W OR WO CONTRAST 03/28/2019 IMPRESSION: 70% stenosis of the left M1 segment, extending over a length of 3-4 mm. I favor that this is a chronic stenosis rather than evidence of an embolus. No distal vessel occlusion is identified.  MR BRAIN WO CONTRAST 03/28/2019 IMPRESSION: 1. Small acute cortical infarcts in the left temporal, occipital, and parietal lobes. 2. Chronic left basal ganglia infarct. 3. 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   ECHOCARDIOGRAM 03/28/2019 1. The left ventricle has normal systolic function with an ejection fraction of 60-65%. The cavity size was normal. There is mild concentric left ventricular hypertrophy. Left ventricular diastolic function could not be evaluated due to nondiagnostic images. 2. The right ventricle has normal systolic function. The cavity was normal. There is no increase in right ventricular wall thickness. Right ventricular systolic pressure could not be assessed. 3. The aortic valve is grossly normal. Aortic valve regurgitation was not assessed by color flow Doppler.   ASSESSMENT: Cheryl Massey is a 63 y.o. year old female presented with right-sided weakness on 03/28/2019 with stroke work-up revealing left MCA cortical infarcts in setting of left M1 intrinsic stenosis.  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  Vascular risk factors include HTN, HLD, DM and prior stroke on imaging.  Initially recovering well but has had new onset of worsening memory, speech  difficulty and headaches over the past 2 weeks.  She did not have recommended follow-up with neurosurgery after hospital discharge.    PLAN:  1. Left MCA stroke: Continue aspirin 81 mg daily  and start atorvastatin for secondary stroke prevention. Maintain strict control of hypertension with blood pressure goal below 130/90, diabetes with hemoglobin A1c goal below 6.5% and cholesterol with LDL cholesterol (bad cholesterol) goal below 70 mg/dL.  I also advised the patient to eat a healthy diet with plenty of whole grains, cereals, fruits and vegetables, exercise regularly with at least 30 minutes of continuous activity daily and maintain ideal body weight. 2. New onset symptoms: Recommend MRI w/wo contrast to assess for potential new stroke or underlying etiology causing new onset symptoms.  We will hold  off on medication management for headaches at this time and advised to use Tylenol as needed for pain management.  May consider initiation of headache management based on results of imaging and if indicated at follow-up visit 3. Large suprasellar/sellar pituitary mass: Advised need to follow-up with neurosurgery as recommended at discharge.  Phone number provided to daughter to ensure follow-up scheduled 4. HTN: Advised to continue current treatment regimen. 5. HLD: Discussion regarding importance of HLD management for secondary stroke prevention.  We will check lab work today but advised ongoing follow-up needed with PCP 6. DMII: We will obtain A1c level today.  Advised to continue to monitor glucose levels at home along with need of establish care with PCP for ongoing management and monitoring    Follow up in 2 months or call earlier if needed   Greater than 50% of time during this 45 minute visit was spent on counseling, explanation of diagnosis of left MCA stroke, discussion regarding new onset of symptoms, indication for imaging, need for neurosurgery follow-up, reviewing risk factor management  of intracranial stenosis, HTN, HLD and DM, planning of further management along with potential future management, and discussion with patient and family answering all questions.    Frann Rider, AGNP-BC  [facility] [address] [place], [place] [address]  Phone [phone] Fax [phone] Note: This document was prepared with digital dictation and possible smart phrase technology. Any transcriptional errors that result from this process are unintentional.

## 2019-10-04 NOTE — Progress Notes (Signed)
I agree with the above plan 

## 2019-10-04 NOTE — Patient Instructions (Signed)
Will obtain MRI w/wo contrast due to worsening headaches and memory loss -it will also be recommended for you to schedule follow-up visit with neurosurgery as recommended at hospital discharge  Continue aspirin 81 mg daily  and start Lipitor for secondary stroke prevention  We will check lab work today  Continue to follow up with PCP regarding cholesterol, blood pressure and diabetes management   Continue to monitor blood pressure at home  Maintain strict control of hypertension with blood pressure goal below 130/90, diabetes with hemoglobin A1c goal below 6.5% and cholesterol with LDL cholesterol (bad cholesterol) goal below 70 mg/dL. I also advised the patient to eat a healthy diet with plenty of whole grains, cereals, fruits and vegetables, exercise regularly and maintain ideal body weight.  Followup in the future with me in 2 months or call earlier if needed       Thank you for coming to see [place] at [facility]. I hope we have been able to provide you high quality care today.  You may receive a patient satisfaction survey over the next few weeks. We would appreciate your feedback and comments so that we may continue to improve ourselves and the health of our patients.

## 2019-10-05 ENCOUNTER — Telehealth: Payer: Self-pay

## 2019-10-05 ENCOUNTER — Telehealth: Payer: Self-pay | Admitting: Adult Health

## 2019-10-05 LAB — COMPREHENSIVE METABOLIC PANEL
ALT: 17 IU/L (ref 0–32)
AST: 20 IU/L (ref 0–40)
Albumin/Globulin Ratio: 1.7 (ref 1.2–2.2)
Albumin: 4.4 g/dL (ref 3.8–4.8)
Alkaline Phosphatase: 77 IU/L (ref 39–117)
BUN/Creatinine Ratio: 22 (ref 12–28)
BUN: 18 mg/dL (ref 8–27)
Bilirubin Total: 0.3 mg/dL (ref 0.0–1.2)
CO2: 21 mmol/L (ref 20–29)
Calcium: 9.7 mg/dL (ref 8.7–10.3)
Chloride: 104 mmol/L (ref 96–106)
Creatinine, Ser: 0.83 mg/dL (ref 0.57–1.00)
GFR calc Af Amer: 87 mL/min/{1.73_m2} (ref >59)
GFR calc non Af Amer: 76 mL/min/{1.73_m2} (ref >59)
Globulin, Total: 2.6 g/dL (ref 1.5–4.5)
Glucose: 157 mg/dL — ABNORMAL HIGH (ref 65–99)
Potassium: 4.6 mmol/L (ref 3.5–5.2)
Sodium: 140 mmol/L (ref 134–144)
Total Protein: 7 g/dL (ref 6.0–8.5)

## 2019-10-05 LAB — LIPID PANEL
Chol/HDL Ratio: 4.3 ratio (ref 0.0–4.4)
Cholesterol, Total: 203 mg/dL — ABNORMAL HIGH (ref 100–199)
HDL: 47 mg/dL (ref >39)
LDL Chol Calc (NIH): 97 mg/dL (ref 0–99)
Triglycerides: 352 mg/dL — ABNORMAL HIGH (ref 0–149)
VLDL Cholesterol Cal: 59 mg/dL — ABNORMAL HIGH (ref 5–40)

## 2019-10-05 LAB — HEMOGLOBIN A1C
Est. average glucose Bld gHb Est-mCnc: 209 mg/dL
Hgb A1c MFr Bld: 8.9 % — ABNORMAL HIGH (ref 4.8–5.6)

## 2019-10-05 NOTE — Telephone Encounter (Signed)
self pay order sent to GI. They will reach out to the patient to schedule.  °

## 2019-10-05 NOTE — Telephone Encounter (Signed)
Called pt (with assistance of the interpreter) to share her recent lab results...   "Please notify patient that her recent lab work did show continued elevation of her cholesterol levels. Will be recommended for her to restart Lipitor as discussed during visit. Her diabetic level continue to be elevated and she will need to ensure follow-up visit [facility] -interpreter will be needed "  1st attempt a gentleman answered the phone said "no" and hung up.  2nd attempt: No answer, Left a VM for her to call us back.

## 2019-10-06 ENCOUNTER — Telehealth: Payer: Self-pay | Admitting: Adult Health

## 2019-10-06 MED ORDER — TOPIRAMATE 25 MG PO TABS: 25.0000 mg | ORAL_TABLET | Freq: Two times a day (BID) | ORAL | 3 refills | Status: DC

## 2019-10-06 NOTE — Telephone Encounter (Signed)
Daughter stopped in to office requesting something for continued headaches. Will start topamax 25mg  twice daily -advised the potential side effects.  Advised to call office after 2 weeks for potential need to increase.  She verbalized understanding

## 2019-10-11 ENCOUNTER — Inpatient Hospital Stay (HOSPITAL_COMMUNITY)
Admission: EM | Admit: 2019-10-11 | Discharge: 2019-10-13 | DRG: 065 | Disposition: A | Discharge status: Home Health | Payer: Medicaid Other | Attending: Internal Medicine | Admitting: Internal Medicine

## 2019-10-11 ENCOUNTER — Emergency Department (HOSPITAL_COMMUNITY): Payer: Medicaid Other

## 2019-10-11 ENCOUNTER — Other Ambulatory Visit: Payer: Self-pay

## 2019-10-11 DIAGNOSIS — E1165 Type 2 diabetes mellitus with hyperglycemia: Secondary | ICD-10-CM | POA: Diagnosis present

## 2019-10-11 DIAGNOSIS — Z90710 Acquired absence of both cervix and uterus: Secondary | ICD-10-CM

## 2019-10-11 DIAGNOSIS — Z8249 Family history of ischemic heart disease and other diseases of the circulatory system: Secondary | ICD-10-CM

## 2019-10-11 DIAGNOSIS — Z8619 Personal history of other infectious and parasitic diseases: Secondary | ICD-10-CM | POA: Diagnosis not present

## 2019-10-11 DIAGNOSIS — Z7982 Long term (current) use of aspirin: Secondary | ICD-10-CM

## 2019-10-11 DIAGNOSIS — Z20828 Contact with and (suspected) exposure to other viral communicable diseases: Secondary | ICD-10-CM | POA: Diagnosis present

## 2019-10-11 DIAGNOSIS — Z7984 Long term (current) use of oral hypoglycemic drugs: Secondary | ICD-10-CM | POA: Diagnosis not present

## 2019-10-11 DIAGNOSIS — R4701 Aphasia: Secondary | ICD-10-CM | POA: Diagnosis present

## 2019-10-11 DIAGNOSIS — I10 Essential (primary) hypertension: Secondary | ICD-10-CM | POA: Diagnosis present

## 2019-10-11 DIAGNOSIS — I63512 Cerebral infarction due to unspecified occlusion or stenosis of left middle cerebral artery: Secondary | ICD-10-CM | POA: Diagnosis present

## 2019-10-11 DIAGNOSIS — Z8673 Personal history of transient ischemic attack (TIA), and cerebral infarction without residual deficits: Secondary | ICD-10-CM | POA: Diagnosis not present

## 2019-10-11 DIAGNOSIS — R739 Hyperglycemia, unspecified: Secondary | ICD-10-CM | POA: Diagnosis not present

## 2019-10-11 DIAGNOSIS — R29810 Facial weakness: Secondary | ICD-10-CM | POA: Diagnosis present

## 2019-10-11 DIAGNOSIS — Z9114 Patient's other noncompliance with medication regimen: Secondary | ICD-10-CM

## 2019-10-11 DIAGNOSIS — E785 Hyperlipidemia, unspecified: Secondary | ICD-10-CM | POA: Diagnosis present

## 2019-10-11 DIAGNOSIS — F17210 Nicotine dependence, cigarettes, uncomplicated: Secondary | ICD-10-CM | POA: Diagnosis present

## 2019-10-11 DIAGNOSIS — R29708 NIHSS score 8: Secondary | ICD-10-CM | POA: Diagnosis present

## 2019-10-11 DIAGNOSIS — E119 Type 2 diabetes mellitus without complications: Secondary | ICD-10-CM

## 2019-10-11 DIAGNOSIS — G8191 Hemiplegia, unspecified affecting right dominant side: Secondary | ICD-10-CM | POA: Diagnosis present

## 2019-10-11 DIAGNOSIS — I700 Atherosclerosis of aorta: Secondary | ICD-10-CM | POA: Diagnosis present

## 2019-10-11 DIAGNOSIS — F419 Anxiety disorder, unspecified: Secondary | ICD-10-CM | POA: Diagnosis present

## 2019-10-11 DIAGNOSIS — Z79899 Other long term (current) drug therapy: Secondary | ICD-10-CM | POA: Diagnosis not present

## 2019-10-11 DIAGNOSIS — I639 Cerebral infarction, unspecified: Secondary | ICD-10-CM | POA: Diagnosis present

## 2019-10-11 LAB — DIFFERENTIAL
Abs Immature Granulocytes: 0.02 10*3/uL (ref 0.00–0.07)
Basophils Absolute: 0 10*3/uL (ref 0.0–0.1)
Basophils Relative: 1 %
Eosinophils Absolute: 0.1 10*3/uL (ref 0.0–0.5)
Eosinophils Relative: 1 %
Immature Granulocytes: 0 %
Lymphocytes Relative: 30 %
Lymphs Abs: 1.7 10*3/uL (ref 0.7–4.0)
Monocytes Absolute: 0.4 10*3/uL (ref 0.1–1.0)
Monocytes Relative: 8 %
Neutro Abs: 3.4 10*3/uL (ref 1.7–7.7)
Neutrophils Relative %: 60 %

## 2019-10-11 LAB — URINALYSIS, ROUTINE W REFLEX MICROSCOPIC
Bacteria, UA: NONE SEEN
Bilirubin Urine: NEGATIVE
Glucose, UA: >=500 mg/dL — AB
Hgb urine dipstick: NEGATIVE
Ketones, ur: NEGATIVE mg/dL
Leukocytes,Ua: NEGATIVE
Nitrite: NEGATIVE
Protein, ur: NEGATIVE mg/dL
Specific Gravity, Urine: 1.024 (ref 1.005–1.030)
pH: 8 (ref 5.0–8.0)

## 2019-10-11 LAB — COMPREHENSIVE METABOLIC PANEL
ALT: 18 U/L (ref 0–44)
AST: 20 U/L (ref 15–41)
Albumin: 4 g/dL (ref 3.5–5.0)
Alkaline Phosphatase: 71 U/L (ref 38–126)
Anion gap: 12 (ref 5–15)
BUN: 17 mg/dL (ref 8–23)
CO2: 18 mmol/L — ABNORMAL LOW (ref 22–32)
Calcium: 9.4 mg/dL (ref 8.9–10.3)
Chloride: 105 mmol/L (ref 98–111)
Creatinine, Ser: 0.9 mg/dL (ref 0.44–1.00)
GFR calc Af Amer: >60 mL/min (ref >60)
GFR calc non Af Amer: >60 mL/min (ref >60)
Glucose, Bld: 326 mg/dL — ABNORMAL HIGH (ref 70–99)
Potassium: 4.1 mmol/L (ref 3.5–5.1)
Sodium: 135 mmol/L (ref 135–145)
Total Bilirubin: 0.5 mg/dL (ref 0.3–1.2)
Total Protein: 7.2 g/dL (ref 6.5–8.1)

## 2019-10-11 LAB — I-STAT CHEM 8, ED
BUN: 18 mg/dL (ref 8–23)
Calcium, Ion: 1.18 mmol/L (ref 1.15–1.40)
Chloride: 106 mmol/L (ref 98–111)
Creatinine, Ser: 0.8 mg/dL (ref 0.44–1.00)
Glucose, Bld: 319 mg/dL — ABNORMAL HIGH (ref 70–99)
HCT: 40 % (ref 36.0–46.0)
Hemoglobin: 13.6 g/dL (ref 12.0–15.0)
Potassium: 4.2 mmol/L (ref 3.5–5.1)
Sodium: 136 mmol/L (ref 135–145)
TCO2: 19 mmol/L — ABNORMAL LOW (ref 22–32)

## 2019-10-11 LAB — RAPID URINE DRUG SCREEN, HOSP PERFORMED
Amphetamines: NOT DETECTED
Barbiturates: NOT DETECTED
Benzodiazepines: NOT DETECTED
Cocaine: NOT DETECTED
Opiates: NOT DETECTED
Tetrahydrocannabinol: NOT DETECTED

## 2019-10-11 LAB — CBC
HCT: 39 % (ref 36.0–46.0)
Hemoglobin: 13.5 g/dL (ref 12.0–15.0)
MCH: 30.3 pg (ref 26.0–34.0)
MCHC: 34.6 g/dL (ref 30.0–36.0)
MCV: 87.4 fL (ref 80.0–100.0)
Platelets: 287 10*3/uL (ref 150–400)
RBC: 4.46 MIL/uL (ref 3.87–5.11)
RDW: 12.8 % (ref 11.5–15.5)
WBC: 5.6 10*3/uL (ref 4.0–10.5)
nRBC: 0 % (ref 0.0–0.2)

## 2019-10-11 LAB — CBG MONITORING, ED
Glucose-Capillary: 277 mg/dL — ABNORMAL HIGH (ref 70–99)
Glucose-Capillary: 237 mg/dL — ABNORMAL HIGH (ref 70–99)
Glucose-Capillary: 102 mg/dL — ABNORMAL HIGH (ref 70–99)

## 2019-10-11 LAB — PROTIME-INR
INR: 1 (ref 0.8–1.2)
Prothrombin Time: 12.7 seconds (ref 11.4–15.2)

## 2019-10-11 LAB — APTT: aPTT: 28 seconds (ref 24–36)

## 2019-10-11 LAB — GLUCOSE, CAPILLARY: Glucose-Capillary: 120 mg/dL — ABNORMAL HIGH (ref 70–99)

## 2019-10-11 LAB — ETHANOL: Alcohol, Ethyl (B): <10 mg/dL (ref <10)

## 2019-10-11 LAB — SARS CORONAVIRUS 2 (TAT 6-24 HRS): SARS Coronavirus 2: NEGATIVE

## 2019-10-11 MED ORDER — ASPIRIN 81 MG PO TBEC
81.0000 mg | DELAYED_RELEASE_TABLET | Freq: Every day | ORAL | Status: DC
  Filled 2019-10-11: qty 1

## 2019-10-11 MED ORDER — ENOXAPARIN SODIUM 40 MG/0.4ML ~~LOC~~ SOLN
40.0000 mg | SUBCUTANEOUS | Status: DC
  Administered 2019-10-11 – 2019-10-13 (×3): 40 mg via SUBCUTANEOUS
  Filled 2019-10-11 (×3): qty 0.4

## 2019-10-11 MED ORDER — SENNOSIDES-DOCUSATE SODIUM 8.6-50 MG PO TABS
1.0000 | ORAL_TABLET | Freq: Every day | ORAL | Status: DC
  Administered 2019-10-11 – 2019-10-12 (×2): 1 via ORAL
  Filled 2019-10-11 (×2): qty 1

## 2019-10-11 MED ORDER — ATORVASTATIN CALCIUM 40 MG PO TABS
40.0000 mg | ORAL_TABLET | Freq: Every day | ORAL | Status: DC
  Administered 2019-10-11 – 2019-10-13 (×3): 40 mg via ORAL
  Filled 2019-10-11 (×3): qty 1

## 2019-10-11 MED ORDER — TOPIRAMATE 25 MG PO TABS
25.0000 mg | ORAL_TABLET | Freq: Two times a day (BID) | ORAL | Status: DC
  Administered 2019-10-11 – 2019-10-13 (×4): 25 mg via ORAL
  Filled 2019-10-11 (×4): qty 1

## 2019-10-11 MED ORDER — STROKE: EARLY STAGES OF RECOVERY BOOK
Freq: Once | Status: DC
  Filled 2019-10-11: qty 1

## 2019-10-11 MED ORDER — IOHEXOL 350 MG/ML SOLN
100.0000 mL | Freq: Once | INTRAVENOUS | Status: AC | PRN
  Administered 2019-10-11: 100 mL via INTRAVENOUS

## 2019-10-11 MED ORDER — SODIUM CHLORIDE 0.9 % IV SOLN
INTRAVENOUS | Status: DC
  Administered 2019-10-12: 17:00:00 50 mL via INTRAVENOUS

## 2019-10-11 MED ORDER — ACETAMINOPHEN 650 MG RE SUPP: 650.0000 mg | RECTAL | Status: DC | PRN

## 2019-10-11 MED ORDER — ACETAMINOPHEN 160 MG/5ML PO SOLN: 650.0000 mg | ORAL | Status: DC | PRN

## 2019-10-11 MED ORDER — INSULIN ASPART 100 UNIT/ML ~~LOC~~ SOLN
0.0000 [IU] | SUBCUTANEOUS | Status: DC
  Administered 2019-10-12: 2 [IU] via SUBCUTANEOUS
  Administered 2019-10-12: 3 [IU] via SUBCUTANEOUS
  Administered 2019-10-13: 08:00:00 2 [IU] via SUBCUTANEOUS
  Administered 2019-10-13: 5 [IU] via SUBCUTANEOUS
  Administered 2019-10-13: 12:00:00 3 [IU] via SUBCUTANEOUS

## 2019-10-11 MED ORDER — ACETAMINOPHEN 325 MG PO TABS
650.0000 mg | ORAL_TABLET | ORAL | Status: DC | PRN
  Administered 2019-10-11 – 2019-10-12 (×2): 650 mg via ORAL
  Filled 2019-10-11 (×2): qty 2

## 2019-10-11 MED ORDER — ASPIRIN-DIPYRIDAMOLE ER 25-200 MG PO CP12
1.0000 | ORAL_CAPSULE | Freq: Two times a day (BID) | ORAL | Status: DC
  Administered 2019-10-11 – 2019-10-12 (×2): 1 via ORAL
  Filled 2019-10-11 (×4): qty 1

## 2019-10-11 MED ORDER — LOSARTAN POTASSIUM 25 MG PO TABS
25.0000 mg | ORAL_TABLET | Freq: Every day | ORAL | Status: DC
  Administered 2019-10-12 – 2019-10-13 (×2): 25 mg via ORAL
  Filled 2019-10-11 (×2): qty 1

## 2019-10-11 NOTE — ED Triage Notes (Signed)
Patient is non-english speaking. Complains of weakness on the right side for 10 days.

## 2019-10-11 NOTE — H&P (Signed)
History and Physical    Cheryl Massey [id number] DOB: 07-05-1956 DOA: 10/11/2019  PCP: System, Pcp Not In (Confirm with patient/family/NH records and if not entered, this has to be entered at [facility] point of entry) Patient coming from: patient is coming from home  I have personally briefly reviewed patient's old medical records in [place]  Chief Complaint: difficulty with speech and right upper extremity weakness  HPI: Cheryl Massey is a 63 y.o. female with past medical history of stroke involving left temporal, occipital and parietal lobes.  Patient speaks no english, neither does her daughter. All history taken via electronic interpreter. Daughter states approximately 10 days ago she was having difficulty expressing herself which worsened over time.  Today she was noted to have right facial droop, decreased sensation on the right face, arm and leg and also weakness of the right arm and leg.  Patient was brought to the hospital for fear that she may be having a stroke.  At time of evaluation patient strength in the right arm and leg have improved however she still has decreased sensation on the right face.  Patient is not a TPA candidate nor is she a intervention candidate at this time .  Last neuro office visit 10/04/2019: "Sayward Foutz Polancois being seen today for in office hospital follow-up regarding left MCA stroke in setting of left M1 intrinsic stenosis 03/28/2019.  History obtained from patient, daughter, Spanish interpreter and chart review. Reviewed all radiology images and labs personally.  Cheryl Massey a 63 y.o.femalewith history of DB and HTN who presented on 03/28/2019 with R arm and leg weakness.  Stroke work-up revealed left MCA cortical infarcts as evidenced on MRI in setting of left M1 intrinsic stenosis versus unknown embolic source.  MRI showed small areas of infarct within left temporal,  occipital and parietal lobe as well as old left BG infarcts.  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  2D echo normal EF without cardiac source of embolus identified.  LDL 94.  A1c 8.8.  Recommended DAPT for 3 months then aspirin alone.  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  HTN stable.  Initiated atorvastatin 40 mg daily for HLD management.  Uncontrolled DM and recommend close PCP follow-up.  Other stroke risk factors include prior stroke on imaging.  She was discharged home in stable condition.  Cheryl Massey is being seen today for hospital follow-up accompanied by daughter and spanish interpreter.  Daughter provides majority of history with assistance of interpreter.  After hospital discharge, she was experiencing generalized headache and memory loss with improvement.  Memory loss and headaches have worsened over the past 2 weeks.  When requested daughter provide examples of memory loss and cognitive concerns, she states she has greater difficulty trying to get out what is needed to be said with word finding difficulty.  Headaches have been present over left temporal and orbital area and have been present daily.  Denies visual changes, weakness, numbness/tingling or any other neurological changes.  Daughter also endorses decreased appetite with weight loss over the past few months.  It was recommended to follow-up with neurosurgery at hospital discharge but patient did not have this follow-up.  It was also recommended for her to follow-up with community health and wellness to establish care with PCP but this apparently was not done.  Unable  to adequately determine exact medications patient is currently taking as initially endorse only being on diabetic medication (glipizide-Metformin) but then did state she was currently on aspirin 81 mg daily.  Per report, she is not  currently taking atorvastatin and unable to determine if she was on Plavix for 84-month duration as recommended at discharge.  Difficulty with medication and appointment compliance due to lack of insurance and inability to pay for appointments, medications and testing.  Blood pressure today 133/82.  No further concerns at this time."   ED Course: Pt hemodynamically stable. Lab was unrevealing except for serum glucose of 387. CT/CTA revealed new Left MCA occlusion and stroke. Neurology consulted by which time symptoms were improved. She was greater than 10 hours out from onset of symptoms and thus not a TPA candidate. TRH was called for CVA admission. Carotid evaluation per CTA neck and previous admission work-up. Echo at April admission was normal.  Review of Systems: As per HPI otherwise 10 point review of systems negative.    Past Medical History:  Diagnosis Date   Diabetes mellitus without complication ([place])    Hypertension     Past Surgical History:  Procedure Laterality Date   ABDOMINAL HYSTERECTOMY     APPENDECTOMY     Soc Hx -  Marital status not determined. She lives with her daughter's family. Not working. Financially stressed and difficult to afford medications.   reports that she has never smoked. She has never used smokeless tobacco. She reports that she does not drink alcohol or use drugs.  No Known Allergies  Family History  Problem Relation Age of Onset   Hypertension Mother    Hypertension Father      Prior to Admission medications   Medication Sig Start Date End Date Taking? Authorizing Provider  aspirin EC 81 MG EC tablet Take 1 tablet (81 mg total) by mouth daily. Patient taking differently: Take 81 mg by mouth every 3 (three) days.  03/30/19  Yes Shelly Coss, MD  glyBURIDE (DIABETA) 5 MG tablet Take 5 mg by mouth 2 (two) times daily with a meal.   Yes [provider]  losartan (COZAAR) 25 MG tablet Take 1 tablet (25 mg total) by mouth daily  for 30 days. 03/29/19 10/11/19 Yes Shelly Coss, MD  topiramate (TOPAMAX) 25 MG tablet Take 1 tablet (25 mg total) by mouth 2 (two) times daily. Patient taking differently: Take 25 mg by mouth every 3 (three) days.  10/06/19  Yes McCue, Janett Billow, NP  atorvastatin (LIPITOR) 40 MG tablet Take 1 tablet (40 mg total) by mouth daily at 6 PM. Patient not taking: Reported on 10/11/2019 03/29/19   Shelly Coss, MD  glipiZIDE-metformin (METAGLIP) 5-500 MG tablet Take 1 tablet by mouth 2 (two) times daily before a meal for 30 days. Patient not taking: Reported on 10/11/2019 03/29/19 10/11/19  Shelly Coss, MD  metFORMIN (GLUCOPHAGE) 500 MG tablet Take 1,000 mg by mouth 2 (two) times daily with a meal.    [provider]    Physical Exam: Vitals:   10/11/19 1600 10/11/19 1615 10/11/19 1630 10/11/19 1705  BP: 122/87 (!) 146/85 125/62   Pulse: 60 (!) 59 (!) 57   Resp: 14 17 15    Temp:      TempSrc:      SpO2: 99% 100% 100% 100%  Weight:      Height:        Constitutional: NAD, calm, comfortable Vitals:   10/11/19 1600 10/11/19 1615 10/11/19 1630 10/11/19 1705  BP: 122/87 (!) 146/85 125/62   Pulse: 60 (!) 59 (!) 57   Resp: 14 17 15    Temp:      TempSrc:      SpO2: 99% 100% 100% 100%  Weight:      Height:       General appearance:  WNWD woman, awake and cooperative, in no distress Eyes: PERRL, lids and conjunctivae normal ENMT: Mucous membranes are moist. Posterior pharynx clear of any exudate or lesions.Normal dentition.  Neck: normal, supple, no masses, no thyromegaly Respiratory: clear to auscultation bilaterally, no wheezing, no crackles. Normal respiratory effort. No accessory muscle use.  Cardiovascular: Regular rate and rhythm, no murmurs / rubs / gallops. No extremity edema. 2+ pedal pulses. No carotid bruits.  Abdomen: no tenderness, no masses palpated. No hepatosplenomegaly. Bowel sounds positive.  Musculoskeletal: no clubbing / cyanosis. No joint deformity upper  and lower extremities. Good ROM, no contractures. Normal muscle tone.  Skin: no rashes, lesions, ulcers. No induration Neurologic: CN 2-12: nl facial symmetry, PERRLA/EOMI, no deviation of tongue, dysarthric. MS: RUE 4/5, R grip 3/5; LUE 4/5, grip 4/5; distal LE preserved at 5/5. No tremor. Sensation diminished right face otherwise preserved. Psychiatric:  judgment and insight not assessed due to language barrier but appears to understand situation. Marland KitchenAppears  alert and oriented. Normal mood.     Labs on Admission: I have personally reviewed following labs and imaging studies  CBC: Recent Labs  Lab 10/11/19 1334 10/11/19 1337  WBC 5.6  --   NEUTROABS 3.4  --   HGB 13.5 13.6  HCT 39.0 40.0  MCV 87.4  --   PLT 287  --    Basic Metabolic Panel: Recent Labs  Lab 10/11/19 1334 10/11/19 1337  NA 135 136  K 4.1 4.2  CL 105 106  CO2 18*  --   GLUCOSE 326* 319*  BUN 17 18  CREATININE 0.90 0.80  CALCIUM 9.4  --    GFR: Estimated Creatinine Clearance: 69.3 mL/min (by C-G formula based on SCr of 0.8 mg/dL). Liver Function Tests: Recent Labs  Lab 10/11/19 1334  AST 20  ALT 18  ALKPHOS 71  BILITOT 0.5  PROT 7.2  ALBUMIN 4.0   No results for input(s): LIPASE, AMYLASE in the last 168 hours. No results for input(s): AMMONIA in the last 168 hours. Coagulation Profile: Recent Labs  Lab 10/11/19 1334  INR 1.0   Cardiac Enzymes: No results for input(s): CKTOTAL, CKMB, CKMBINDEX, TROPONINI in the last 168 hours. BNP (last 3 results) No results for input(s): PROBNP in the last 8760 hours. HbA1C: No results for input(s): HGBA1C in the last 72 hours. CBG: Recent Labs  Lab 10/11/19 1331 10/11/19 1444  GLUCAP 277* 237*   Lipid Profile: No results for input(s): CHOL, HDL, LDLCALC, TRIG, CHOLHDL, LDLDIRECT in the last 72 hours. Thyroid Function Tests: No results for input(s): TSH, T4TOTAL, FREET4, T3FREE, THYROIDAB in the last 72 hours. Anemia Panel: No results for  input(s): VITAMINB12, FOLATE, FERRITIN, TIBC, IRON, RETICCTPCT in the last 72 hours. Urine analysis:    Component Value Date/Time   COLORURINE STRAW (A) 10/11/2019 [place] 10/11/2019 1549   LABSPEC 1.024 10/11/2019 1549   PHURINE 8.0 10/11/2019 1549   GLUCOSEU >=500 (A) 10/11/2019 1549   HGBUR NEGATIVE 10/11/2019 1549   BILIRUBINUR NEGATIVE 10/11/2019 1549   KETONESUR NEGATIVE 10/11/2019 1549   PROTEINUR NEGATIVE 10/11/2019 1549   NITRITE NEGATIVE 10/11/2019 1549   LEUKOCYTESUR NEGATIVE 10/11/2019 1549  Radiological Exams on Admission: Ct Angio Head W Or Wo Contrast  Result Date: 10/11/2019 CLINICAL DATA:  Right-sided weakness. Unable to speak. EXAM: CT ANGIOGRAPHY HEAD AND NECK CT PERFUSION BRAIN TECHNIQUE: Multidetector CT imaging of the head and neck was performed using the standard protocol during bolus administration of intravenous contrast. Multiplanar CT image reconstructions and MIPs were obtained to evaluate the vascular anatomy. Carotid stenosis measurements (when applicable) are obtained utilizing NASCET criteria, using the distal internal carotid diameter as the denominator. Multiphase CT imaging of the brain was performed following IV bolus contrast injection. Subsequent parametric perfusion maps were calculated using RAPID software. CONTRAST:  112mL OMNIPAQUE IOHEXOL 350 MG/ML SOLN COMPARISON:  Head CT earlier same day FINDINGS: CTA NECK FINDINGS Aortic arch: Mild aortic atherosclerosis. No aneurysm or dissection. Left vertebral artery arises directly from the arch. Right carotid system: Common carotid artery widely patent to the bifurcation. Carotid bifurcation is normal without soft or calcified plaque. Cervical ICA is mildly tortuous but widely patent. Left carotid system: Common carotid artery widely patent to the bifurcation. Carotid bifurcation is normal without soft or calcified plaque. Cervical ICA is tortuous but widely patent. Vertebral arteries: Both  vertebral arteries are widely patent. Right vertebral artery origin is normal. Left vertebral artery arises from the arch as noted above. Skeleton: Normal Other neck: No mass or adenopathy. Upper chest: Normal Review of the MIP images confirms the above findings CTA HEAD FINDINGS Anterior circulation: Both internal carotid arteries are patent through the skull base and siphon regions. No siphon stenosis. On the right, the anterior and middle cerebral arteries are patent. No large or medium vessel occlusion. On the left, the anterior cerebral artery is patent and normal appearing. The left middle cerebral artery is occluded in the proximal M1 segment. In April of this year, this vessel was stenotic but not occluded. Distal branch vessels do show some opacification presumably secondary to collateral supply. Posterior circulation: Both vertebral arteries are patent to the basilar. No basilar stenosis. Posterior circulation branch vessels are normal. Venous sinuses: Patent and normal. Anatomic variants: None significant. Review of the MIP images confirms the above findings CT Brain Perfusion Findings: ASPECTS: 8 CBF (<30%) Volume: 70mL Perfusion (Tmax>6.0s) volume: 47mL Mismatch Volume: 46mL Infarction Location:Late acute infarction in the left temporal and parietal region which is showing luxury perfusion, with artifactual absence of diminished CBF. Cerebral blood volume increased in the region of infarction. Considering that, the region appears fairly well matched to the area prolonged T-max. IMPRESSION: 1. No carotid bifurcation disease on either side. 2. 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 In April this was stenotic but patent. Distal branch vessels show some opacification presumably secondary to collateral supply. 3. CT perfusion shows a 14 CC region of T-max greater than 6 seconds in the left temporal and parietal region which is showing luxury perfusion. This is consistent with  completed infarction with luxury perfusion. Considering that, the region appears fairly well matched to the area of infarction and I doubt that there is a large region of continued at risk brain. 4. Both vertebral arteries are widely patent to the basilar. Left vertebral artery arises directly from the arch. Electronically Signed   By: Nelson Chimes M.D.   On: 10/11/2019 15:15   Ct Angio Neck W Or Wo Contrast  Result Date: 10/11/2019 CLINICAL DATA:  Right-sided weakness. Unable to speak. EXAM: CT ANGIOGRAPHY HEAD AND NECK CT PERFUSION BRAIN TECHNIQUE: Multidetector CT imaging of the head and neck was  performed using the standard protocol during bolus administration of intravenous contrast. Multiplanar CT image reconstructions and MIPs were obtained to evaluate the vascular anatomy. Carotid stenosis measurements (when applicable) are obtained utilizing NASCET criteria, using the distal internal carotid diameter as the denominator. Multiphase CT imaging of the brain was performed following IV bolus contrast injection. Subsequent parametric perfusion maps were calculated using RAPID software. CONTRAST:  132mL OMNIPAQUE IOHEXOL 350 MG/ML SOLN COMPARISON:  Head CT earlier same day FINDINGS: CTA NECK FINDINGS Aortic arch: Mild aortic atherosclerosis. No aneurysm or dissection. Left vertebral artery arises directly from the arch. Right carotid system: Common carotid artery widely patent to the bifurcation. Carotid bifurcation is normal without soft or calcified plaque. Cervical ICA is mildly tortuous but widely patent. Left carotid system: Common carotid artery widely patent to the bifurcation. Carotid bifurcation is normal without soft or calcified plaque. Cervical ICA is tortuous but widely patent. Vertebral arteries: Both vertebral arteries are widely patent. Right vertebral artery origin is normal. Left vertebral artery arises from the arch as noted above. Skeleton: Normal Other neck: No mass or adenopathy. Upper  chest: Normal Review of the MIP images confirms the above findings CTA HEAD FINDINGS Anterior circulation: Both internal carotid arteries are patent through the skull base and siphon regions. No siphon stenosis. On the right, the anterior and middle cerebral arteries are patent. No large or medium vessel occlusion. On the left, the anterior cerebral artery is patent and normal appearing. The left middle cerebral artery is occluded in the proximal M1 segment. In April of this year, this vessel was stenotic but not occluded. Distal branch vessels do show some opacification presumably secondary to collateral supply. Posterior circulation: Both vertebral arteries are patent to the basilar. No basilar stenosis. Posterior circulation branch vessels are normal. Venous sinuses: Patent and normal. Anatomic variants: None significant. Review of the MIP images confirms the above findings CT Brain Perfusion Findings: ASPECTS: 8 CBF (<30%) Volume: 35mL Perfusion (Tmax>6.0s) volume: 49mL Mismatch Volume: 75mL Infarction Location:Late acute infarction in the left temporal and parietal region which is showing luxury perfusion, with artifactual absence of diminished CBF. Cerebral blood volume increased in the region of infarction. Considering that, the region appears fairly well matched to the area prolonged T-max. IMPRESSION: 1. No carotid bifurcation disease on either side. 2. 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 In April this was stenotic but patent. Distal branch vessels show some opacification presumably secondary to collateral supply. 3. CT perfusion shows a 14 CC region of T-max greater than 6 seconds in the left temporal and parietal region which is showing luxury perfusion. This is consistent with completed infarction with luxury perfusion. Considering that, the region appears fairly well matched to the area of infarction and I doubt that there is a large region of continued at risk brain. 4.  Both vertebral arteries are widely patent to the basilar. Left vertebral artery arises directly from the arch. Electronically Signed   By: Nelson Chimes M.D.   On: 10/11/2019 15:15   Ct Cerebral Perfusion W Contrast  Result Date: 10/11/2019 CLINICAL DATA:  Right-sided weakness. Unable to speak. EXAM: CT ANGIOGRAPHY HEAD AND NECK CT PERFUSION BRAIN TECHNIQUE: Multidetector CT imaging of the head and neck was performed using the standard protocol during bolus administration of intravenous contrast. Multiplanar CT image reconstructions and MIPs were obtained to evaluate the vascular anatomy. Carotid stenosis measurements (when applicable) are obtained utilizing NASCET criteria, using the distal internal carotid diameter as the denominator. Multiphase  CT imaging of the brain was performed following IV bolus contrast injection. Subsequent parametric perfusion maps were calculated using RAPID software. CONTRAST:  129mL OMNIPAQUE IOHEXOL 350 MG/ML SOLN COMPARISON:  Head CT earlier same day FINDINGS: CTA NECK FINDINGS Aortic arch: Mild aortic atherosclerosis. No aneurysm or dissection. Left vertebral artery arises directly from the arch. Right carotid system: Common carotid artery widely patent to the bifurcation. Carotid bifurcation is normal without soft or calcified plaque. Cervical ICA is mildly tortuous but widely patent. Left carotid system: Common carotid artery widely patent to the bifurcation. Carotid bifurcation is normal without soft or calcified plaque. Cervical ICA is tortuous but widely patent. Vertebral arteries: Both vertebral arteries are widely patent. Right vertebral artery origin is normal. Left vertebral artery arises from the arch as noted above. Skeleton: Normal Other neck: No mass or adenopathy. Upper chest: Normal Review of the MIP images confirms the above findings CTA HEAD FINDINGS Anterior circulation: Both internal carotid arteries are patent through the skull base and siphon regions. No  siphon stenosis. On the right, the anterior and middle cerebral arteries are patent. No large or medium vessel occlusion. On the left, the anterior cerebral artery is patent and normal appearing. The left middle cerebral artery is occluded in the proximal M1 segment. In April of this year, this vessel was stenotic but not occluded. Distal branch vessels do show some opacification presumably secondary to collateral supply. Posterior circulation: Both vertebral arteries are patent to the basilar. No basilar stenosis. Posterior circulation branch vessels are normal. Venous sinuses: Patent and normal. Anatomic variants: None significant. Review of the MIP images confirms the above findings CT Brain Perfusion Findings: ASPECTS: 8 CBF (<30%) Volume: 29mL Perfusion (Tmax>6.0s) volume: 37mL Mismatch Volume: 39mL Infarction Location:Late acute infarction in the left temporal and parietal region which is showing luxury perfusion, with artifactual absence of diminished CBF. Cerebral blood volume increased in the region of infarction. Considering that, the region appears fairly well matched to the area prolonged T-max. IMPRESSION: 1. No carotid bifurcation disease on either side. 2. 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 In April this was stenotic but patent. Distal branch vessels show some opacification presumably secondary to collateral supply. 3. CT perfusion shows a 14 CC region of T-max greater than 6 seconds in the left temporal and parietal region which is showing luxury perfusion. This is consistent with completed infarction with luxury perfusion. Considering that, the region appears fairly well matched to the area of infarction and I doubt that there is a large region of continued at risk brain. 4. Both vertebral arteries are widely patent to the basilar. Left vertebral artery arises directly from the arch. Electronically Signed   By: Nelson Chimes M.D.   On: 10/11/2019 15:15   Ct Head Code  Stroke Wo Contrast  Result Date: 10/11/2019 CLINICAL DATA:  Code stroke. Suspicion of cerebral hemorrhage. Specific symptoms not described. EXAM: CT HEAD WITHOUT CONTRAST TECHNIQUE: Contiguous axial images were obtained from the base of the skull through the vertex without intravenous contrast. COMPARISON:  MRI 03/28/2019.  CT 03/28/2019. FINDINGS: Brain: Old infarctions affect the left basal ganglia. There is acute infarction affecting the left posterior temporal and parietal cortical and subcortical brain consistent with MCA branch vessel territory infarction. This is probably about 24-day-old based on the low density. No sign of hemorrhage. No mass effect related to the infarction. 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 No obstructive hydrocephalus overall. No extra-axial fluid collection. Vascular: No abnormal vascular finding by CT. Skull: Negative Sinuses/Orbits: Clear except for mild mucosal thickening in the maxillary and ethmoid sinuses. Orbits negative. Other: None ASPECTS ([place] Stroke Program Early CT Score) - Ganglionic level infarction (caudate, lentiform nuclei, internal capsule, insula, M1-M3 cortex): 6 - Supraganglionic infarction (M4-M6 cortex): 2 Total score (0-10 with 10 being normal): 8 IMPRESSION: 1. Areas of acute infarction in the left posterior temporal and parietal cortical and subcortical brain. Low-density suggesting that the stroke may be 67-day-old. No sign of hemorrhage or mass effect. Old infarctions in the left basal ganglia. 2. 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 Pituitary adenoma versus meningioma. 3. ASPECTS is 8 4. These results were communicated to Dr. Leonel Ramsay at 1:50 pmon 10/27/2020by text page via the  [facility] messaging system. Electronically Signed   By: Nelson Chimes M.D.   On: 10/11/2019 13:54    EKG: Independently reviewed. NSR, normal EKG  Assessment/Plan Active Problems:   CVA (cerebral vascular accident) ([place])   Essential hypertension   Type 2 diabetes mellitus treated without insulin ([place])   CVA (cerebrovascular accident) ([place])  (please populate well all problems here in Problem List. (For example, if patient is on BP meds at home and you resume or decide to hold them, it is a problem that needs to be her. Same for CAD, COPD, HLD and so on)   1. CVA - left MCA CVA with right sided weakness, predominatly affecting right UE and speech. Had previous CVA April 2020. Plan Tele admit  With recent studies in April and ED evaluation no repeat diagnostics indicated  PT/OT evaluation to assess ability to return home vs rehab  Dual platelet therapy with aggrenox  Continue antilipemic medication  2. DM - patient poorly controlled. Medication costs are a problem. A1C 8.9% Plan Hold metformin and glipizide  Sliding scale insulin coverage while in-patient   3. HTN -  Will continue home medications   DVT prophylaxis: lovenox (Lovenox/Heparin/SCD's/anticoagulated/None (if comfort care) Code Status: full code (Full/Partial (specify details) Family Communication: daughter present and we communicated via [employer] (Specify name, relationship. Do not write "discussed with patient". Specify tel # if discussed over the phone) Disposition Plan: home vs SNF pending PT/OT evaluation (specify when and where you expect patient to be discharged) Consults called: Neuro - Dr. Katherine Roan has seen patient(with names) Admission status: inpatient/tele (inpatient / obs / tele / medical floor / SDU)   Adella Hare MD [facility] Pager [phone]  If 7PM-7AM, please contact night-coverage [email or web address] Password [email or web address]  10/11/2019, 5:35 PM

## 2019-10-11 NOTE — ED Provider Notes (Signed)
[facility] EMERGENCY DEPARTMENT Provider Note   CSN: [id number] Arrival date & time: 10/11/19  1311     History   Chief Complaint Chief Complaint  Patient presents with   Code Stroke    HPI [facility] Cheryl Massey is a 63 y.o. female.     Pt presents to the ED today with right sided weakness for 10 hours.  Initially triage thought it was 10 days, but it is 10 hours.  Pt has not been able to speak or to move her right arm.  Pt was admitted for a CVA in April of 2020.  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  Pt was supposed to f/u.  She has not yet done so.  Unfortunately, pt has not followed up to establish care with pcp and is not able to take her meds due to lack of insurance and money.  The pt did see neurology on 10/20 and she was neurologically intact then.  Of note, pt was + for Covid in July, but has no current sx.        Past Medical History:  Diagnosis Date   Diabetes mellitus without complication ([place])    Hypertension     Patient Active Problem List   Diagnosis Date Noted   TIA (transient ischemic attack) 03/28/2019   Essential hypertension 03/28/2019   Type 2 diabetes mellitus treated without insulin ([place]) 03/28/2019    Past Surgical History:  Procedure Laterality Date   ABDOMINAL HYSTERECTOMY     APPENDECTOMY       OB History    Gravida  1   Para  1   Term      Preterm      AB      Living  1     SAB      TAB      Ectopic      Multiple      Live Births               Home Medications    Prior to Admission medications   Medication Sig Start Date End Date Taking? Authorizing Provider  aspirin EC 81 MG EC tablet Take 1 tablet (81 mg total) by mouth daily. Patient taking differently: Take 81 mg by mouth every 3 (three) days.  03/30/19  Yes Shelly Coss, MD  glyBURIDE (DIABETA) 5 MG tablet Take 5 mg  by mouth 2 (two) times daily with a meal.   Yes [provider]  losartan (COZAAR) 25 MG tablet Take 1 tablet (25 mg total) by mouth daily for 30 days. 03/29/19 10/11/19 Yes Shelly Coss, MD  atorvastatin (LIPITOR) 40 MG tablet Take 1 tablet (40 mg total) by mouth daily at 6 PM. Patient not taking: Reported on 10/11/2019 03/29/19   Shelly Coss, MD  glipiZIDE-metformin (METAGLIP) 5-500 MG tablet Take 1 tablet by mouth 2 (two) times daily before a meal for 30 days. Patient not taking: Reported on 10/11/2019 03/29/19 10/11/19  Shelly Coss, MD  metFORMIN (GLUCOPHAGE) 500 MG tablet Take 1,000 mg by mouth 2 (two) times daily with a meal.    [provider]  topiramate (TOPAMAX) 25 MG tablet Take 1 tablet (25 mg total) by mouth 2 (two) times daily. Patient taking differently: Take 25 mg by mouth every 3 (three) days.  10/06/19   Frann Rider, NP    Family History Family History  Problem  Relation Age of Onset   Hypertension Mother    Hypertension Father     Social History Social History   Tobacco Use   Smoking status: Never Smoker   Smokeless tobacco: Never Used  Substance Use Topics   Alcohol use: No   Drug use: No     Allergies   Patient has no known allergies.   Review of Systems Review of Systems  Neurological: Positive for speech difficulty, weakness and numbness.  All other systems reviewed and are negative.    Physical Exam Updated Vital Signs BP 133/90    Pulse 84    Temp 98.6 F (37 C) (Oral)    Resp 13    Ht 5\' 3"  (1.6 m)    Wt 72 kg    SpO2 100%    BMI 28.12 kg/m   Physical Exam Vitals signs and nursing note reviewed.  Constitutional:      Appearance: Normal appearance.  HENT:     Head: Normocephalic and atraumatic.     Right Ear: External ear normal.     Left Ear: External ear normal.     Nose: Nose normal.     Mouth/Throat:     Mouth: Mucous membranes are moist.     Pharynx: Oropharynx is clear.  Eyes:     Extraocular  Movements: Extraocular movements intact.     Conjunctiva/sclera: Conjunctivae normal.     Pupils: Pupils are equal, round, and reactive to light.  Neck:     Musculoskeletal: Normal range of motion and neck supple.  Cardiovascular:     Rate and Rhythm: Normal rate and regular rhythm.     Pulses: Normal pulses.     Heart sounds: Normal heart sounds.  Pulmonary:     Effort: Pulmonary effort is normal.     Breath sounds: Normal breath sounds.  Abdominal:     General: Abdomen is flat. Bowel sounds are normal.     Palpations: Abdomen is soft.  Musculoskeletal: Normal range of motion.  Skin:    General: Skin is warm.     Capillary Refill: Capillary refill takes less than 2 seconds.  Neurological:     Mental Status: She is alert.     Comments: Difficulty speaking.  Right arm weakness.      ED Treatments / Results  Labs (all labs ordered are listed, but only abnormal results are displayed) Labs Reviewed  COMPREHENSIVE METABOLIC PANEL - Abnormal; Notable for the following components:      Result Value   CO2 18 (*)    Glucose, Bld 326 (*)    All other components within normal limits  CBG MONITORING, ED - Abnormal; Notable for the following components:   Glucose-Capillary 277 (*)    All other components within normal limits  I-STAT CHEM 8, ED - Abnormal; Notable for the following components:   Glucose, Bld 319 (*)    TCO2 19 (*)    All other components within normal limits  CBG MONITORING, ED - Abnormal; Notable for the following components:   Glucose-Capillary 237 (*)    All other components within normal limits  SARS CORONAVIRUS 2 (TAT 6-24 HRS)  ETHANOL  PROTIME-INR  APTT  CBC  DIFFERENTIAL  RAPID URINE DRUG SCREEN, HOSP PERFORMED  URINALYSIS, ROUTINE W REFLEX MICROSCOPIC    EKG None  Radiology Ct Angio Head W Or Wo Contrast  Result Date: 10/11/2019 CLINICAL DATA:  Right-sided weakness. Unable to speak. EXAM: CT ANGIOGRAPHY HEAD AND NECK CT PERFUSION BRAIN  TECHNIQUE:  Multidetector CT imaging of the head and neck was performed using the standard protocol during bolus administration of intravenous contrast. Multiplanar CT image reconstructions and MIPs were obtained to evaluate the vascular anatomy. Carotid stenosis measurements (when applicable) are obtained utilizing NASCET criteria, using the distal internal carotid diameter as the denominator. Multiphase CT imaging of the brain was performed following IV bolus contrast injection. Subsequent parametric perfusion maps were calculated using RAPID software. CONTRAST:  173mL OMNIPAQUE IOHEXOL 350 MG/ML SOLN COMPARISON:  Head CT earlier same day FINDINGS: CTA NECK FINDINGS Aortic arch: Mild aortic atherosclerosis. No aneurysm or dissection. Left vertebral artery arises directly from the arch. Right carotid system: Common carotid artery widely patent to the bifurcation. Carotid bifurcation is normal without soft or calcified plaque. Cervical ICA is mildly tortuous but widely patent. Left carotid system: Common carotid artery widely patent to the bifurcation. Carotid bifurcation is normal without soft or calcified plaque. Cervical ICA is tortuous but widely patent. Vertebral arteries: Both vertebral arteries are widely patent. Right vertebral artery origin is normal. Left vertebral artery arises from the arch as noted above. Skeleton: Normal Other neck: No mass or adenopathy. Upper chest: Normal Review of the MIP images confirms the above findings CTA HEAD FINDINGS Anterior circulation: Both internal carotid arteries are patent through the skull base and siphon regions. No siphon stenosis. On the right, the anterior and middle cerebral arteries are patent. No large or medium vessel occlusion. On the left, the anterior cerebral artery is patent and normal appearing. The left middle cerebral artery is occluded in the proximal M1 segment. In April of this year, this vessel was stenotic but not occluded. Distal branch vessels  do show some opacification presumably secondary to collateral supply. Posterior circulation: Both vertebral arteries are patent to the basilar. No basilar stenosis. Posterior circulation branch vessels are normal. Venous sinuses: Patent and normal. Anatomic variants: None significant. Review of the MIP images confirms the above findings CT Brain Perfusion Findings: ASPECTS: 8 CBF (<30%) Volume: 56mL Perfusion (Tmax>6.0s) volume: 35mL Mismatch Volume: 41mL Infarction Location:Late acute infarction in the left temporal and parietal region which is showing luxury perfusion, with artifactual absence of diminished CBF. Cerebral blood volume increased in the region of infarction. Considering that, the region appears fairly well matched to the area prolonged T-max. IMPRESSION: 1. No carotid bifurcation disease on either side. 2. 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 In April this was stenotic but patent. Distal branch vessels show some opacification presumably secondary to collateral supply. 3. CT perfusion shows a 14 CC region of T-max greater than 6 seconds in the left temporal and parietal region which is showing luxury perfusion. This is consistent with completed infarction with luxury perfusion. Considering that, the region appears fairly well matched to the area of infarction and I doubt that there is a large region of continued at risk brain. 4. Both vertebral arteries are widely patent to the basilar. Left vertebral artery arises directly from the arch. Electronically Signed   By: Nelson Chimes M.D.   On: 10/11/2019 15:15   Ct Angio Neck W Or Wo Contrast  Result Date: 10/11/2019 CLINICAL DATA:  Right-sided weakness. Unable to speak. EXAM: CT ANGIOGRAPHY HEAD AND NECK CT PERFUSION BRAIN TECHNIQUE: Multidetector CT imaging of the head and neck was performed using the standard protocol during bolus administration of intravenous contrast. Multiplanar CT image reconstructions and MIPs were  obtained to evaluate the vascular anatomy. Carotid stenosis measurements (when applicable) are obtained utilizing NASCET  criteria, using the distal internal carotid diameter as the denominator. Multiphase CT imaging of the brain was performed following IV bolus contrast injection. Subsequent parametric perfusion maps were calculated using RAPID software. CONTRAST:  123mL OMNIPAQUE IOHEXOL 350 MG/ML SOLN COMPARISON:  Head CT earlier same day FINDINGS: CTA NECK FINDINGS Aortic arch: Mild aortic atherosclerosis. No aneurysm or dissection. Left vertebral artery arises directly from the arch. Right carotid system: Common carotid artery widely patent to the bifurcation. Carotid bifurcation is normal without soft or calcified plaque. Cervical ICA is mildly tortuous but widely patent. Left carotid system: Common carotid artery widely patent to the bifurcation. Carotid bifurcation is normal without soft or calcified plaque. Cervical ICA is tortuous but widely patent. Vertebral arteries: Both vertebral arteries are widely patent. Right vertebral artery origin is normal. Left vertebral artery arises from the arch as noted above. Skeleton: Normal Other neck: No mass or adenopathy. Upper chest: Normal Review of the MIP images confirms the above findings CTA HEAD FINDINGS Anterior circulation: Both internal carotid arteries are patent through the skull base and siphon regions. No siphon stenosis. On the right, the anterior and middle cerebral arteries are patent. No large or medium vessel occlusion. On the left, the anterior cerebral artery is patent and normal appearing. The left middle cerebral artery is occluded in the proximal M1 segment. In April of this year, this vessel was stenotic but not occluded. Distal branch vessels do show some opacification presumably secondary to collateral supply. Posterior circulation: Both vertebral arteries are patent to the basilar. No basilar stenosis. Posterior circulation branch vessels  are normal. Venous sinuses: Patent and normal. Anatomic variants: None significant. Review of the MIP images confirms the above findings CT Brain Perfusion Findings: ASPECTS: 8 CBF (<30%) Volume: 76mL Perfusion (Tmax>6.0s) volume: 16mL Mismatch Volume: 60mL Infarction Location:Late acute infarction in the left temporal and parietal region which is showing luxury perfusion, with artifactual absence of diminished CBF. Cerebral blood volume increased in the region of infarction. Considering that, the region appears fairly well matched to the area prolonged T-max. IMPRESSION: 1. No carotid bifurcation disease on either side. 2. 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 In April this was stenotic but patent. Distal branch vessels show some opacification presumably secondary to collateral supply. 3. CT perfusion shows a 14 CC region of T-max greater than 6 seconds in the left temporal and parietal region which is showing luxury perfusion. This is consistent with completed infarction with luxury perfusion. Considering that, the region appears fairly well matched to the area of infarction and I doubt that there is a large region of continued at risk brain. 4. Both vertebral arteries are widely patent to the basilar. Left vertebral artery arises directly from the arch. Electronically Signed   By: Nelson Chimes M.D.   On: 10/11/2019 15:15   Ct Cerebral Perfusion W Contrast  Result Date: 10/11/2019 CLINICAL DATA:  Right-sided weakness. Unable to speak. EXAM: CT ANGIOGRAPHY HEAD AND NECK CT PERFUSION BRAIN TECHNIQUE: Multidetector CT imaging of the head and neck was performed using the standard protocol during bolus administration of intravenous contrast. Multiplanar CT image reconstructions and MIPs were obtained to evaluate the vascular anatomy. Carotid stenosis measurements (when applicable) are obtained utilizing NASCET criteria, using the distal internal carotid diameter as the denominator.  Multiphase CT imaging of the brain was performed following IV bolus contrast injection. Subsequent parametric perfusion maps were calculated using RAPID software. CONTRAST:  173mL OMNIPAQUE IOHEXOL 350 MG/ML SOLN COMPARISON:  Head CT  earlier same day FINDINGS: CTA NECK FINDINGS Aortic arch: Mild aortic atherosclerosis. No aneurysm or dissection. Left vertebral artery arises directly from the arch. Right carotid system: Common carotid artery widely patent to the bifurcation. Carotid bifurcation is normal without soft or calcified plaque. Cervical ICA is mildly tortuous but widely patent. Left carotid system: Common carotid artery widely patent to the bifurcation. Carotid bifurcation is normal without soft or calcified plaque. Cervical ICA is tortuous but widely patent. Vertebral arteries: Both vertebral arteries are widely patent. Right vertebral artery origin is normal. Left vertebral artery arises from the arch as noted above. Skeleton: Normal Other neck: No mass or adenopathy. Upper chest: Normal Review of the MIP images confirms the above findings CTA HEAD FINDINGS Anterior circulation: Both internal carotid arteries are patent through the skull base and siphon regions. No siphon stenosis. On the right, the anterior and middle cerebral arteries are patent. No large or medium vessel occlusion. On the left, the anterior cerebral artery is patent and normal appearing. The left middle cerebral artery is occluded in the proximal M1 segment. In April of this year, this vessel was stenotic but not occluded. Distal branch vessels do show some opacification presumably secondary to collateral supply. Posterior circulation: Both vertebral arteries are patent to the basilar. No basilar stenosis. Posterior circulation branch vessels are normal. Venous sinuses: Patent and normal. Anatomic variants: None significant. Review of the MIP images confirms the above findings CT Brain Perfusion Findings: ASPECTS: 8 CBF (<30%) Volume:  83mL Perfusion (Tmax>6.0s) volume: 53mL Mismatch Volume: 44mL Infarction Location:Late acute infarction in the left temporal and parietal region which is showing luxury perfusion, with artifactual absence of diminished CBF. Cerebral blood volume increased in the region of infarction. Considering that, the region appears fairly well matched to the area prolonged T-max. IMPRESSION: 1. No carotid bifurcation disease on either side. 2. 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 In April this was stenotic but patent. Distal branch vessels show some opacification presumably secondary to collateral supply. 3. CT perfusion shows a 14 CC region of T-max greater than 6 seconds in the left temporal and parietal region which is showing luxury perfusion. This is consistent with completed infarction with luxury perfusion. Considering that, the region appears fairly well matched to the area of infarction and I doubt that there is a large region of continued at risk brain. 4. Both vertebral arteries are widely patent to the basilar. Left vertebral artery arises directly from the arch. Electronically Signed   By: Nelson Chimes M.D.   On: 10/11/2019 15:15   Ct Head Code Stroke Wo Contrast  Result Date: 10/11/2019 CLINICAL DATA:  Code stroke. Suspicion of cerebral hemorrhage. Specific symptoms not described. EXAM: CT HEAD WITHOUT CONTRAST TECHNIQUE: Contiguous axial images were obtained from the base of the skull through the vertex without intravenous contrast. COMPARISON:  MRI 03/28/2019.  CT 03/28/2019. FINDINGS: Brain: Old infarctions affect the left basal ganglia. There is acute infarction affecting the left posterior temporal and parietal cortical and subcortical brain consistent with MCA branch vessel territory infarction. This is probably about 36-day-old based on the low density. No sign of hemorrhage. No mass effect related to the infarction. 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 No obstructive hydrocephalus overall. No extra-axial fluid collection. Vascular: No abnormal vascular finding by CT. Skull: Negative  Sinuses/Orbits: Clear except for mild mucosal thickening in the maxillary and ethmoid sinuses. Orbits negative. Other: None ASPECTS ([place] Stroke Program Early CT Score) - Ganglionic level infarction (caudate, lentiform nuclei, internal capsule, insula, M1-M3 cortex): 6 - Supraganglionic infarction (M4-M6 cortex): 2 Total score (0-10 with 10 being normal): 8 IMPRESSION: 1. Areas of acute infarction in the left posterior temporal and parietal cortical and subcortical brain. Low-density suggesting that the stroke may be 57-day-old. No sign of hemorrhage or mass effect. Old infarctions in the left basal ganglia. 2. 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 Pituitary adenoma versus meningioma. 3. ASPECTS is 8 4. These results were communicated to Dr. Leonel Ramsay at 1:50 pmon 10/27/2020by text page via the [facility] messaging system. Electronically Signed   By: Nelson Chimes M.D.   On: 10/11/2019 13:54    Procedures Procedures (including critical care time)  Medications Ordered in ED Medications  iohexol (OMNIPAQUE) 350 MG/ML injection 100 mL (100 mLs Intravenous Contrast Given 10/11/19 1430)     Initial Impression / Assessment and Plan / ED Course  I have reviewed the triage vital signs and the nursing notes.  Pertinent labs & imaging results that were available during my care of the patient were reviewed by me and considered in my medical decision making (see chart for details).   I saw pt on the bridge upon notification from nurse about possible stroke and a code stroke was called.    Unfortunately, pt does have a completed  stroke on CT and is outside the window for tPA.  Pt was seen in CT by the code stroke team.  They request admission by medicine.  Pt d/w Dr. Linda Hedges ([facility]) for admission.  CRITICAL CARE Performed by: Isla Pence   Total critical care time: 30 minutes  Critical care time was exclusive of separately billable procedures and treating other patients.  Critical care was necessary to treat or prevent imminent or life-threatening deterioration.  Critical care was time spent personally by me on the following activities: development of treatment plan with patient and/or surrogate as well as nursing, discussions with consultants, evaluation of patient's response to treatment, examination of patient, obtaining history from patient or surrogate, ordering and performing treatments and interventions, ordering and review of laboratory studies, ordering and review of radiographic studies, pulse oximetry and re-evaluation of patient's condition.  [facility] Cheryl Massey was evaluated in Emergency Department on 10/11/2019 for the symptoms described in the history of present illness. She was evaluated in the context of the global COVID-19 pandemic, which necessitated consideration that the patient might be at risk for infection with the SARS-CoV-2 virus that causes COVID-19. Institutional protocols and algorithms that pertain to the evaluation of patients at risk for COVID-19 are in a state of rapid change based on information released by regulatory bodies including the CDC and federal and state organizations. These policies and algorithms were followed during the patient's care in the ED. Final Clinical Impressions(s) / ED Diagnoses   Final diagnoses:  Acute ischemic stroke ([place])  Hyperglycemia  Noncompliance with medications    ED Discharge Orders    None       Isla Pence, MD 10/11/19 1526

## 2019-10-11 NOTE — ED Notes (Signed)
Daughter [name] Neldon [place] is leaving to go home but please call if there are any changes. Daughter does not speak [place], will need translator. [phone]

## 2019-10-11 NOTE — ED Triage Notes (Signed)
Update: Patient has not been able to feel their face, arm, or right side for 10 hours, instead of 10 days as stated earlier. Patient has a history of a brain tumor. Code stroke protocol initiated

## 2019-10-11 NOTE — ED Notes (Signed)
Daughter reports pt has been acting different for the past 10 days but this morning she was unable to talk and could not feel her face.

## 2019-10-11 NOTE — Telephone Encounter (Signed)
(  please see previous note) Called pt with the assistance of [place] interpreters.   No answer, Left a voicemail.  Pt has no DPR, Could not leave a detailed message.

## 2019-10-11 NOTE — ED Notes (Signed)
Attempted to call report.  Nurse unavailable. 

## 2019-10-11 NOTE — Consult Note (Addendum)
Neurology Consultation  Reason for Consult: Stroke Referring Physician: Gilford Raid  CC: Expressive aphasia and right-sided weakness  History is obtained from: Interpreter and daughter  HPI: Cheryl Massey is a 63 y.o. female with past medical history of stroke involving left temporal, occipital and parietal lobes.  Daughter states approximately 10 days ago she was having difficulty expressing herself which worsened over time.  Today she was noted to have right facial droop, decreased sensation on the right face, arm and leg and also weakness of the right arm and leg.  Patient was brought to the hospital for fear that she may be having a stroke.  At time of consultation patient strength in the right arm and leg have improved however she still has decreased sensation on the right face.  Patient was not a TPA candidate nor was she a intervention candidate at this time.  Exam below:   ED course: CT, CTA of head and neck   Chart review (patient did suffer a stroke on 03/28/2019 which showed small acute cortical infarcts in the left temporal, occipital, and parietal lobes.  At that time echocardiogram showed EF of 60 to 65%.  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  It was favored that this was a chronic stenosis rather than evidence of emboli.  Patient was placed on aspirin 81 mg, Lipitor 40 mg.  LKW: Approximately 10 days prior to hospitalization tpa given?: no, out of window Premorbid modified Rankin scale (mRS): 1 No-show score of 8   Past Medical History:  Diagnosis Date  . Diabetes mellitus without complication ([place])   . Hypertension      Family History  Problem Relation Age of Onset  . Hypertension Mother   . Hypertension Father    Social History:   reports that she has never smoked. She has never used smokeless tobacco. She reports that she does not drink alcohol or use drugs.  Medications No current  facility-administered medications for this encounter.   Current Outpatient Medications:  .  aspirin EC 81 MG EC tablet, Take 1 tablet (81 mg total) by mouth daily., Disp: 30 tablet, Rfl: 0 .  atorvastatin (LIPITOR) 40 MG tablet, Take 1 tablet (40 mg total) by mouth daily at 6 PM., Disp: 30 tablet, Rfl: 0 .  glipiZIDE-metformin (METAGLIP) 5-500 MG tablet, Take 1 tablet by mouth 2 (two) times daily before a meal for 30 days., Disp: 60 tablet, Rfl: 0 .  losartan (COZAAR) 25 MG tablet, Take 1 tablet (25 mg total) by mouth daily for 30 days., Disp: 30 tablet, Rfl: 0 .  topiramate (TOPAMAX) 25 MG tablet, Take 1 tablet (25 mg total) by mouth 2 (two) times daily., Disp: 60 tablet, Rfl: 3   Exam: Current vital signs: BP 138/72 (BP Location: Right Arm)   Pulse 84   Temp 98.6 F (37 C) (Oral)   Resp 18   Ht 5\' 3"  (1.6 m)   Wt 72 kg   SpO2 100%   BMI 28.12 kg/m  Vital signs in last 24 hours: Temp:  [98.6 F (37 C)] 98.6 F (37 C) (10/27 1320) Pulse Rate:  [84] 84 (10/27 1320) Resp:  [18] 18 (10/27 1320) BP: (138)/(72) 138/72 (10/27 1320) SpO2:  [100 %] 100 % (10/27 1320) Weight:  [72 kg] 72 kg (10/27 1324)  ROS:  Unable to obtain due to language barrier.     Physical Exam   Constitutional: Appears well-developed and  well-nourished.  Psych: Affect appropriate to situation Eyes: No scleral injection HENT: No OP obstrucion Head: Normocephalic.  Cardiovascular: Normal rate and regular rhythm.  Respiratory: Effort normal, non-labored breathing GI: Soft.  No distension. There is no tenderness.  Skin: WDI  Neuro:-Exam was performed with interpreter as patient does not speak [place] Mental Status: Patient is awake, nonverbal Able to give a history Patient is able to follow verbal commands but unable to express herself Cranial Nerves: II: Appears to have a right hemianopsia as she does not blink to threat on the right III,IV, VI: EOMI without ptosis or diploplia. Pupils equal, round  and reactive to light V: Facial sensation decreased on the right VII: Right facial droop VIII: hearing is intact to voice X: Palat elevates symmetrically XI: Shoulder shrug is symmetric. XII: tongue is midline without atrophy or fasciculations.  Motor: Tone is normal. Bulk is normal. 5/5 strength was present in all four extremities with the exception of right hip flexion secondary to hip pain Sensory: Sensation is symmetric to light touch and temperature in the arms and legs. Deep Tendon Reflexes: 2+ and symmetric in the biceps and patellae.  Plantars: Toes are downgoing bilaterally.  Cerebellar: FNF and HKS are intact bilaterally  Labs I have reviewed labs in epic and the results pertinent to this consultation are:   CBC    Component Value Date/Time   WBC 5.6 10/11/2019 1334   RBC 4.46 10/11/2019 1334   HGB 13.6 10/11/2019 1337   HCT 40.0 10/11/2019 1337   PLT 287 10/11/2019 1334   MCV 87.4 10/11/2019 1334   MCH 30.3 10/11/2019 1334   MCHC 34.6 10/11/2019 1334   RDW 12.8 10/11/2019 1334   LYMPHSABS 1.7 10/11/2019 1334   MONOABS 0.4 10/11/2019 1334   EOSABS 0.1 10/11/2019 1334   BASOSABS 0.0 10/11/2019 1334    CMP     Component Value Date/Time   NA 136 10/11/2019 1337   NA 140 10/04/2019 1357   K 4.2 10/11/2019 1337   CL 106 10/11/2019 1337   CO2 18 (L) 10/11/2019 1334   GLUCOSE 319 (H) 10/11/2019 1337   BUN 18 10/11/2019 1337   BUN 18 10/04/2019 1357   CREATININE 0.80 10/11/2019 1337   CALCIUM 9.4 10/11/2019 1334   PROT 7.2 10/11/2019 1334   PROT 7.0 10/04/2019 1357   ALBUMIN 4.0 10/11/2019 1334   ALBUMIN 4.4 10/04/2019 1357   AST 20 10/11/2019 1334   ALT 18 10/11/2019 1334   ALKPHOS 71 10/11/2019 1334   BILITOT 0.5 10/11/2019 1334   BILITOT 0.3 10/04/2019 1357   GFRNONAA >60 10/11/2019 1334   GFRAA >60 10/11/2019 1334    Lipid Panel     Component Value Date/Time   CHOL 203 (H) 10/04/2019 1357   TRIG 352 (H) 10/04/2019 1357   HDL 47 10/04/2019  1357   CHOLHDL 4.3 10/04/2019 1357   CHOLHDL 4.9 03/29/2019 0425   VLDL UNABLE TO CALCULATE IF TRIGLYCERIDE OVER 400 mg/dL 03/29/2019 0425   LDLCALC 97 10/04/2019 1357   LDLDIRECT 92.9 03/29/2019 0425     Imaging I have reviewed the images obtained:  CT-scan of the brain-area of acute infarction in the left posterior temporal and parietal cortical and subcortical brain.  Low density suggesting that the stroke may be 36 days old.  No signs of hemorrhage or mass-effect.  Old infarcts in the left basal ganglia noted.  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  MRI examination of the brain-pending  Etta Quill PA-C [facility] [phone]  M-F  (9:00 am- 5:00 PM)  10/11/2019, 2:33 PM     Assessment: 63 year old female with prior history of left-sided stroke.  Recently seen by Dr. Leonie Man.  Currently on Lipitor and aspirin.  Patient arrived to the hospital secondary to 10-day history of worsening expressive aphasia, right-sided weakness and decreased sensation.  She presents with multifocal infarcts.  I suspect that her severe stenosis finally occluded.  CT perfusion shows good perfusion of all the viable areas, so I do not think that there is any benefit to consideration of intervention.  Recommend # MRI of the brain without contrast #Transthoracic Echo,   #For now continue aspirin 325 mg daily  # continue Atorvastatin 80 mg/other high intensity statin # BP goal: permissive HTN upto 220/120 mmHg # HBAIC and Lipid profile # Telemetry monitoring # Frequent neuro checks # NPO until passes stroke swallow screen #PT/OT/speech therapy # please page stroke NP  Or  PA  Or MD from 8am -4 pm  as this patient from this time will be  followed by the stroke.   You can look them up on [email or web address]  Password [email or web address]  Roland Rack, MD [facility] [phone]  If 7pm- 7am,  please page neurology on call as listed in [place].

## 2019-10-11 NOTE — Code Documentation (Signed)
63 yo female coming from home by [employer] with her daughter. Pt is spanish speaking and unable to speak [place]. Pt arrived to triage with complaint of right arm and facial weakness along with trouble speaking. Pt taken to CT. Code Stroke delayed in being called. Stroke Team called by CT Tech about Code Stroke. Met patient in CT. Initial assessment through translator noted to have right facial droop, expressive aphasia, and right hemianopnia. CTA/CTP completed. Pt brought back to room and placed on the monitor. AT that time, daughter reported through interpreter that patient was noted to have change in speech 10 days ago that gradually became worse. Today, pt woke up and had right hand and facial weakness with inability to speak at all. Pt is outside tPA and endovascular window. To be admitted and receive stroke work up. Handoff given to [place], [place]

## 2019-10-12 ENCOUNTER — Encounter: Payer: Self-pay | Admitting: *Deleted

## 2019-10-12 DIAGNOSIS — I6300 Cerebral infarction due to thrombosis of unspecified precerebral artery: Secondary | ICD-10-CM

## 2019-10-12 LAB — GLUCOSE, CAPILLARY
Glucose-Capillary: 155 mg/dL — ABNORMAL HIGH (ref 70–99)
Glucose-Capillary: 160 mg/dL — ABNORMAL HIGH (ref 70–99)
Glucose-Capillary: 85 mg/dL (ref 70–99)
Glucose-Capillary: 263 mg/dL — ABNORMAL HIGH (ref 70–99)
Glucose-Capillary: 119 mg/dL — ABNORMAL HIGH (ref 70–99)

## 2019-10-12 LAB — HEMOGLOBIN A1C
Hgb A1c MFr Bld: 8.8 % — ABNORMAL HIGH (ref 4.8–5.6)
Mean Plasma Glucose: 205.86 mg/dL

## 2019-10-12 LAB — LIPID PANEL
Cholesterol: 228 mg/dL — ABNORMAL HIGH (ref 0–200)
HDL: 44 mg/dL (ref >40)
LDL Cholesterol: 125 mg/dL — ABNORMAL HIGH (ref 0–99)
Total CHOL/HDL Ratio: 5.2 RATIO
Triglycerides: 293 mg/dL — ABNORMAL HIGH (ref <150)
VLDL: 59 mg/dL — ABNORMAL HIGH (ref 0–40)

## 2019-10-12 MED ORDER — ALPRAZOLAM 0.25 MG PO TABS: 0.2500 mg | ORAL_TABLET | Freq: Once | ORAL | Status: AC

## 2019-10-12 MED ORDER — ALPRAZOLAM 0.25 MG PO TABS
0.2500 mg | ORAL_TABLET | Freq: Once | ORAL | Status: AC
  Administered 2019-10-12: 0.25 mg via ORAL
  Filled 2019-10-12: qty 1

## 2019-10-12 MED ORDER — ASPIRIN EC 81 MG PO TBEC
81.0000 mg | DELAYED_RELEASE_TABLET | Freq: Every day | ORAL | Status: DC
  Administered 2019-10-12 – 2019-10-13 (×2): 81 mg via ORAL
  Filled 2019-10-12 (×2): qty 1

## 2019-10-12 MED ORDER — ALPRAZOLAM 0.25 MG PO TABS
0.2500 mg | ORAL_TABLET | Freq: Once | ORAL | Status: AC
  Administered 2019-10-12: 13:00:00 0.25 mg via ORAL
  Filled 2019-10-12: qty 1

## 2019-10-12 MED ORDER — CLOPIDOGREL BISULFATE 75 MG PO TABS
75.0000 mg | ORAL_TABLET | Freq: Every day | ORAL | Status: DC
  Administered 2019-10-12 – 2019-10-13 (×2): 75 mg via ORAL
  Filled 2019-10-12 (×2): qty 1

## 2019-10-12 NOTE — Progress Notes (Signed)
STROKE TEAM PROGRESS NOTE   HISTORY OF PRESENT ILLNESS (per record) Cheryl Massey is a 63 y.o. female with past medical history of stroke. Specifically upon chart review: 03/28/2019 showed small acute cortical infarcts in the left temporal, occipital, and parietal lobes.  At that time echocardiogram showed EF of 60 to 65%.  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  It was favored that this was a chronic stenosis rather than evidence of emboli.  Patient was placed on aspirin 81 mg, Lipitor 40 mg and was improving at home.  Daughter states approximately 10 days PTA she was having difficulty expressing herself which worsened over time to the point that she was noted to have right facial droop, decreased sensation on the right face, arm and leg and also weakness of the right arm and leg. Patient was brought to the hospital for fear that she may be having another stroke.  At time of consultation patient strength in the right arm and leg have improved however she still has decreased sensation on the right face. Patient was not a TPA candidate nor was she a intervention candidate.  LKW: Approximately 10 days prior to hospitalization tpa given?: no, out of window Premorbid modified Rankin scale (mRS): 1 NIHSS: 8  INTERVAL HISTORY Patient is sitting up in a bedside chair.  She is examined using a Spanish language interpreter at the bedside.  She has significant expressive aphasia and is not able to speak even single words.  Her comprehension seems much better preserved and she follows most simple and one-step commands well.  She has right facial droop and mild right hand weakness in addition to expressive aphasia  OBJECTIVE Vitals:   10/11/19 2345 10/12/19 0126 10/12/19 0349 10/12/19 0820  BP: 132/74 138/72 116/85 (!) 144/76  Pulse: 74 72 75 75  Resp: 18 18 18 16   Temp: 97.6 F (36.4 C) 97.8 F (36.6 C) 98.1 F (36.7 C)   TempSrc: Axillary  Axillary Axillary   SpO2: 99% 99% 100% 100%  Weight:      Height:        CBC:  Recent Labs  Lab 10/11/19 1334 10/11/19 1337  WBC 5.6  --   NEUTROABS 3.4  --   HGB 13.5 13.6  HCT 39.0 40.0  MCV 87.4  --   PLT 287  --     Basic Metabolic Panel:  Recent Labs  Lab 10/11/19 1334 10/11/19 1337  NA 135 136  K 4.1 4.2  CL 105 106  CO2 18*  --   GLUCOSE 326* 319*  BUN 17 18  CREATININE 0.90 0.80  CALCIUM 9.4  --     Lipid Panel:     Component Value Date/Time   CHOL 228 (H) 10/12/2019 0329   CHOL 203 (H) 10/04/2019 1357   TRIG 293 (H) 10/12/2019 0329   HDL 44 10/12/2019 0329   HDL 47 10/04/2019 1357   CHOLHDL 5.2 10/12/2019 0329   VLDL 59 (H) 10/12/2019 0329   LDLCALC 125 (H) 10/12/2019 0329   LDLCALC 97 10/04/2019 1357   HgbA1c:  Lab Results  Component Value Date   HGBA1C 8.8 (H) 10/12/2019   Urine Drug Screen:     Component Value Date/Time   LABOPIA NONE DETECTED 10/11/2019 1549   COCAINSCRNUR NONE DETECTED 10/11/2019 1549   LABBENZ NONE DETECTED 10/11/2019 1549   AMPHETMU NONE DETECTED 10/11/2019 1549   THCU NONE DETECTED 10/11/2019 1549  LABBARB NONE DETECTED 10/11/2019 1549    Alcohol Level     Component Value Date/Time   ETH <10 10/11/2019 1423    IMAGING   Ct Angio Head W Or Wo Contrast  Result Date: 10/11/2019 CLINICAL DATA:  Right-sided weakness. Unable to speak. EXAM: CT ANGIOGRAPHY HEAD AND NECK CT PERFUSION BRAIN TECHNIQUE: Multidetector CT imaging of the head and neck was performed using the standard protocol during bolus administration of intravenous contrast. Multiplanar CT image reconstructions and MIPs were obtained to evaluate the vascular anatomy. Carotid stenosis measurements (when applicable) are obtained utilizing NASCET criteria, using the distal internal carotid diameter as the denominator. Multiphase CT imaging of the brain was performed following IV bolus contrast injection. Subsequent parametric perfusion maps were calculated  using RAPID software. CONTRAST:  182mL OMNIPAQUE IOHEXOL 350 MG/ML SOLN COMPARISON:  Head CT earlier same day FINDINGS: CTA NECK FINDINGS Aortic arch: Mild aortic atherosclerosis. No aneurysm or dissection. Left vertebral artery arises directly from the arch. Right carotid system: Common carotid artery widely patent to the bifurcation. Carotid bifurcation is normal without soft or calcified plaque. Cervical ICA is mildly tortuous but widely patent. Left carotid system: Common carotid artery widely patent to the bifurcation. Carotid bifurcation is normal without soft or calcified plaque. Cervical ICA is tortuous but widely patent. Vertebral arteries: Both vertebral arteries are widely patent. Right vertebral artery origin is normal. Left vertebral artery arises from the arch as noted above. Skeleton: Normal Other neck: No mass or adenopathy. Upper chest: Normal Review of the MIP images confirms the above findings CTA HEAD FINDINGS Anterior circulation: Both internal carotid arteries are patent through the skull base and siphon regions. No siphon stenosis. On the right, the anterior and middle cerebral arteries are patent. No large or medium vessel occlusion. On the left, the anterior cerebral artery is patent and normal appearing. The left middle cerebral artery is occluded in the proximal M1 segment. In April of this year, this vessel was stenotic but not occluded. Distal branch vessels do show some opacification presumably secondary to collateral supply. Posterior circulation: Both vertebral arteries are patent to the basilar. No basilar stenosis. Posterior circulation branch vessels are normal. Venous sinuses: Patent and normal. Anatomic variants: None significant. Review of the MIP images confirms the above findings CT Brain Perfusion Findings: ASPECTS: 8 CBF (<30%) Volume: 66mL Perfusion (Tmax>6.0s) volume: 55mL Mismatch Volume: 86mL Infarction Location:Late acute infarction in the left temporal and parietal  region which is showing luxury perfusion, with artifactual absence of diminished CBF. Cerebral blood volume increased in the region of infarction. Considering that, the region appears fairly well matched to the area prolonged T-max. IMPRESSION: 1. No carotid bifurcation disease on either side. 2. 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 In April this was stenotic but patent. Distal branch vessels show some opacification presumably secondary to collateral supply. 3. CT perfusion shows a 14 CC region of T-max greater than 6 seconds in the left temporal and parietal region which is showing luxury perfusion. This is consistent with completed infarction with luxury perfusion. Considering that, the region appears fairly well matched to the area of infarction and I doubt that there is a large region of continued at risk brain. 4. Both vertebral arteries are widely patent to the basilar. Left vertebral artery arises directly from the arch. Electronically Signed   By: Nelson Chimes M.D.   On: 10/11/2019 15:15   Ct Angio Neck W Or Wo Contrast  Result Date: 10/11/2019 CLINICAL DATA:  Right-sided weakness. Unable to speak. EXAM: CT ANGIOGRAPHY HEAD AND NECK CT PERFUSION BRAIN TECHNIQUE: Multidetector CT imaging of the head and neck was performed using the standard protocol during bolus administration of intravenous contrast. Multiplanar CT image reconstructions and MIPs were obtained to evaluate the vascular anatomy. Carotid stenosis measurements (when applicable) are obtained utilizing NASCET criteria, using the distal internal carotid diameter as the denominator. Multiphase CT imaging of the brain was performed following IV bolus contrast injection. Subsequent parametric perfusion maps were calculated using RAPID software. CONTRAST:  116mL OMNIPAQUE IOHEXOL 350 MG/ML SOLN COMPARISON:  Head CT earlier same day FINDINGS: CTA NECK FINDINGS Aortic arch: Mild aortic atherosclerosis. No aneurysm or  dissection. Left vertebral artery arises directly from the arch. Right carotid system: Common carotid artery widely patent to the bifurcation. Carotid bifurcation is normal without soft or calcified plaque. Cervical ICA is mildly tortuous but widely patent. Left carotid system: Common carotid artery widely patent to the bifurcation. Carotid bifurcation is normal without soft or calcified plaque. Cervical ICA is tortuous but widely patent. Vertebral arteries: Both vertebral arteries are widely patent. Right vertebral artery origin is normal. Left vertebral artery arises from the arch as noted above. Skeleton: Normal Other neck: No mass or adenopathy. Upper chest: Normal Review of the MIP images confirms the above findings CTA HEAD FINDINGS Anterior circulation: Both internal carotid arteries are patent through the skull base and siphon regions. No siphon stenosis. On the right, the anterior and middle cerebral arteries are patent. No large or medium vessel occlusion. On the left, the anterior cerebral artery is patent and normal appearing. The left middle cerebral artery is occluded in the proximal M1 segment. In April of this year, this vessel was stenotic but not occluded. Distal branch vessels do show some opacification presumably secondary to collateral supply. Posterior circulation: Both vertebral arteries are patent to the basilar. No basilar stenosis. Posterior circulation branch vessels are normal. Venous sinuses: Patent and normal. Anatomic variants: None significant. Review of the MIP images confirms the above findings CT Brain Perfusion Findings: ASPECTS: 8 CBF (<30%) Volume: 68mL Perfusion (Tmax>6.0s) volume: 68mL Mismatch Volume: 9mL Infarction Location:Late acute infarction in the left temporal and parietal region which is showing luxury perfusion, with artifactual absence of diminished CBF. Cerebral blood volume increased in the region of infarction. Considering that, the region appears fairly well  matched to the area prolonged T-max. IMPRESSION: 1. No carotid bifurcation disease on either side. 2. 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 In April this was stenotic but patent. Distal branch vessels show some opacification presumably secondary to collateral supply. 3. CT perfusion shows a 14 CC region of T-max greater than 6 seconds in the left temporal and parietal region which is showing luxury perfusion. This is consistent with completed infarction with luxury perfusion. Considering that, the region appears fairly well matched to the area of infarction and I doubt that there is a large region of continued at risk brain. 4. Both vertebral arteries are widely patent to the basilar. Left vertebral artery arises directly from the arch. Electronically Signed   By: Nelson Chimes M.D.   On: 10/11/2019 15:15   Ct Cerebral Perfusion W Contrast  Result Date: 10/11/2019 CLINICAL DATA:  Right-sided weakness. Unable to speak. EXAM: CT ANGIOGRAPHY HEAD AND NECK CT PERFUSION BRAIN TECHNIQUE: Multidetector CT imaging of the head and neck was performed using the standard protocol during bolus administration of intravenous contrast. Multiplanar CT image reconstructions and MIPs were obtained to  evaluate the vascular anatomy. Carotid stenosis measurements (when applicable) are obtained utilizing NASCET criteria, using the distal internal carotid diameter as the denominator. Multiphase CT imaging of the brain was performed following IV bolus contrast injection. Subsequent parametric perfusion maps were calculated using RAPID software. CONTRAST:  144mL OMNIPAQUE IOHEXOL 350 MG/ML SOLN COMPARISON:  Head CT earlier same day FINDINGS: CTA NECK FINDINGS Aortic arch: Mild aortic atherosclerosis. No aneurysm or dissection. Left vertebral artery arises directly from the arch. Right carotid system: Common carotid artery widely patent to the bifurcation. Carotid bifurcation is normal without soft or calcified  plaque. Cervical ICA is mildly tortuous but widely patent. Left carotid system: Common carotid artery widely patent to the bifurcation. Carotid bifurcation is normal without soft or calcified plaque. Cervical ICA is tortuous but widely patent. Vertebral arteries: Both vertebral arteries are widely patent. Right vertebral artery origin is normal. Left vertebral artery arises from the arch as noted above. Skeleton: Normal Other neck: No mass or adenopathy. Upper chest: Normal Review of the MIP images confirms the above findings CTA HEAD FINDINGS Anterior circulation: Both internal carotid arteries are patent through the skull base and siphon regions. No siphon stenosis. On the right, the anterior and middle cerebral arteries are patent. No large or medium vessel occlusion. On the left, the anterior cerebral artery is patent and normal appearing. The left middle cerebral artery is occluded in the proximal M1 segment. In April of this year, this vessel was stenotic but not occluded. Distal branch vessels do show some opacification presumably secondary to collateral supply. Posterior circulation: Both vertebral arteries are patent to the basilar. No basilar stenosis. Posterior circulation branch vessels are normal. Venous sinuses: Patent and normal. Anatomic variants: None significant. Review of the MIP images confirms the above findings CT Brain Perfusion Findings: ASPECTS: 8 CBF (<30%) Volume: 20mL Perfusion (Tmax>6.0s) volume: 72mL Mismatch Volume: 7mL Infarction Location:Late acute infarction in the left temporal and parietal region which is showing luxury perfusion, with artifactual absence of diminished CBF. Cerebral blood volume increased in the region of infarction. Considering that, the region appears fairly well matched to the area prolonged T-max. IMPRESSION: 1. No carotid bifurcation disease on either side. 2. 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 In April this was stenotic but  patent. Distal branch vessels show some opacification presumably secondary to collateral supply. 3. CT perfusion shows a 14 CC region of T-max greater than 6 seconds in the left temporal and parietal region which is showing luxury perfusion. This is consistent with completed infarction with luxury perfusion. Considering that, the region appears fairly well matched to the area of infarction and I doubt that there is a large region of continued at risk brain. 4. Both vertebral arteries are widely patent to the basilar. Left vertebral artery arises directly from the arch. Electronically Signed   By: Nelson Chimes M.D.   On: 10/11/2019 15:15   Ct Head Code Stroke Wo Contrast  Result Date: 10/11/2019 CLINICAL DATA:  Code stroke. Suspicion of cerebral hemorrhage. Specific symptoms not described. EXAM: CT HEAD WITHOUT CONTRAST TECHNIQUE: Contiguous axial images were obtained from the base of the skull through the vertex without intravenous contrast. COMPARISON:  MRI 03/28/2019.  CT 03/28/2019. FINDINGS: Brain: Old infarctions affect the left basal ganglia. There is acute infarction affecting the left posterior temporal and parietal cortical and subcortical brain consistent with MCA branch vessel territory infarction. This is probably about 6-day-old based on the low density. No sign of hemorrhage. No mass  effect related to the infarction. 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 No obstructive hydrocephalus overall. No extra-axial fluid collection. Vascular: No abnormal vascular finding by CT. Skull: Negative Sinuses/Orbits: Clear except for mild mucosal thickening in the maxillary and ethmoid sinuses. Orbits negative. Other: None ASPECTS ([place] Stroke Program Early CT Score) - Ganglionic level infarction (caudate, lentiform nuclei, internal  capsule, insula, M1-M3 cortex): 6 - Supraganglionic infarction (M4-M6 cortex): 2 Total score (0-10 with 10 being normal): 8 IMPRESSION: 1. Areas of acute infarction in the left posterior temporal and parietal cortical and subcortical brain. Low-density suggesting that the stroke may be 49-day-old. No sign of hemorrhage or mass effect. Old infarctions in the left basal ganglia. 2. 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 Pituitary adenoma versus meningioma. 3. ASPECTS is 8 4. These results were communicated to Dr. Leonel Ramsay at 1:50 pmon 10/27/2020by text page via the [facility] messaging system. Electronically Signed   By: Nelson Chimes M.D.   On: 10/11/2019 13:54   Transthoracic Echocardiogram  00/00/2020 Pending No results found for this or any previous visit (from the past 43800 hour(s)).  ECG - SR rate  73 BPM. (See cardiology reading for complete details)  PHYSICAL EXAM Blood pressure (!) 144/76, pulse 75, temperature 98.1 F (36.7 C), temperature source Axillary, resp. rate 16, height 5\' 3"  (1.6 m), weight 72 kg, SpO2 100 %.  GEN: Mildly obese middle-aged Hispanic lady. No acute distress Psych: Affect appropriate to situation Eyes: No scleral injection HENT: No OP obstrucion Head: Normocephalic.  Cardiovascular: Normal rate and regular rhythm.  Respiratory: Effort normal, non-labored breathing GI: Soft. No distension. There is no tenderness.  Skin: Warm, dry  Neuro:-Exam was performed with Spanish interpreter as patient does not speak [place] Mental Status: Patient is awake, nonverbal.  Profound expressive aphasia.  Unable to name or repeat Able to give a history Patient is able to follow simple verbal commands but unable to express herself Cranial Nerves: II: Appears to have a right hemianopsia as she does not blink to threat on the right III,IV, VI: EOMI without ptosis or diploplia. Pupils  equal, round and reactive to light V: Facial sensation decreased on the right VII: Right facial droop VIII: hearing is intact to voice X: Palat elevates symmetrically XI: Shoulder shrug is symmetric. XII: tongue is midline without atrophy or fasciculations.  Motor: Tone is normal. Bulk is normal. 5/5 strength was present in all four extremities with the exception of right hip flexion secondary to hip pain.  Diminished fine finger movements on the right and orbits left over right upper extremity. Sensory: Sensation is symmetric to light touch and temperature in the arms and legs. Deep Tendon Reflexes: 2+ and symmetric in the biceps and patellae.  Plantars: Toes are downgoing bilaterally.  Cerebellar: FNF and HKS are intact bilaterally   HOME MEDICATIONS:  Medications Prior to Admission  Medication Sig Dispense Refill  . aspirin EC 81 MG EC tablet Take 1 tablet (81 mg total) by mouth daily. (Patient taking differently: Take 81 mg by mouth every 3 (three) days. ) 30 tablet 0  . losartan (COZAAR) 25 MG tablet Take 1 tablet (25 mg total) by mouth daily for 30 days. 30 tablet 0  . topiramate (TOPAMAX) 25 MG tablet  Take 1 tablet (25 mg total) by mouth 2 (two) times daily. (Patient taking differently: Take 25 mg by mouth every 3 (three) days. ) 60 tablet 3  . atorvastatin (LIPITOR) 40 MG tablet Take 1 tablet (40 mg total) by mouth daily at 6 PM. (Patient not taking: Reported on 10/11/2019) 30 tablet 0  . glipiZIDE-metformin (METAGLIP) 5-500 MG tablet Take 1 tablet by mouth 2 (two) times daily before a meal for 30 days. (Patient not taking: Reported on 10/11/2019) 60 tablet 0      HOSPITAL MEDICATIONS:  .  stroke: mapping our early stages of recovery book   Does not apply Once  . atorvastatin  40 mg Oral q1800  . dipyridamole-aspirin  1 capsule Oral BID  . enoxaparin (LOVENOX) injection  40 mg Subcutaneous Q24H  . insulin aspart  0-15 Units Subcutaneous Q4H  . losartan  25 mg Oral Daily   . senna-docusate  1 tablet Oral QHS  . topiramate  25 mg Oral BID    ALLERGIES No Known Allergies  ASSESSMENT/PLAN [name] is a 63 y.o. female with history of recent stroke. Now presenting with 10-day history of worsening expressive aphasia, right-sided weakness and decreased sensation.  She presents with multifocal infarcts.  I suspect that her severe stenosis finally occluded.  CT perfusion shows good perfusion of all the viable areas, so I do not think that there is any benefit to consideration of intervention.    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  Code Stroke CT Head -    ASPECTS 8  CT head - old Lt strokes  MRI head- tiny very distal cortical infarcts in the left temporal, occipital region. Macroadenoma also noted.  MRA head - not done  CTA H&N - As seen in April, M1 stenosis is now occluded, distal collateral supply presumed  CT Perfusion- 91ml mismatch  Carotid Doppler - not needed  2D Echo - pending  Sars Corona Virus 2  neg  LDL - 125    Component Value Date/Time   LDLCALC 125 (H) 10/12/2019 0329   LDLCALC 97 10/04/2019 1357     HgbA1c - 8.8  UDS neg  VTE prophylaxis - Lovenox Diet  Diet Order            Diet heart healthy/carb modified Room service appropriate? Yes; Fluid consistency: Thin  Diet effective now               ASA prior to admission, now on  aspirin and Plavix x  Patient counseled to be compliant with her antithrombotic medications  Ongoing aggressive stroke risk factor management  Therapy recommendations:  pending  Disposition:  Pending  Hypertension  Home BP meds: Cozaar  Current BP meds: Cozaar  Stable . Permissive hypertension (OK if < 220/120) but gradually normalize in 5-7 days  . Long-term BP goal  normotensive  Hyperlipidemia  Home Lipid lowering medication: Lipitor 40mg   LDL 125, goal < 70  Current lipid lowering medication: Lipitor 40 mg daily   Continue statin at discharge  Diabetes  Home diabetic meds: Glipizide  Current diabetic meds: SSI  HgbA1c 8.8, goal < 7.0 Recent Labs    10/11/19 2056 10/11/19 2356 10/12/19 0407  GLUCAP 102* 120* 155*     Other Stroke Risk Factors  Advanced age  Cigarette smoker;advised to stop smoking  Hx  stroke/TIA  Hospital day # 1  Desiree Metzger-Cihelka, ARNP-C, ANVP-BC Pager: [phone] I have personally obtained history,examined this patient, reviewed notes, independently viewed imaging studies, participated in medical decision making and plan of care.ROS completed by me personally and pertinent positives fully documented  I have made any additions or clarifications directly to the above note.  She presented with expressive aphasia and right facial droop secondary to left MCA infarct from left MCA occlusion.  Recommend dual antiplatelet therapy aspirin and Plavix for 3 months followed by aspirin alone.  Continue ongoing stroke work-up.  Aggressive risk factor modification.  Speech therapy for language.  Mobilize out of bed.  Therapy consults.  Greater than 50% time during this 35-minute visit was spent on counseling and coordination of care about her stroke and answering questions  Antony Contras, MD Medical Director [place] Pager: [phone] 10/12/2019 4:17 PM  To contact Stroke Continuity provider, please refer to [email or web address] After hours, contact General Neurology

## 2019-10-12 NOTE — Evaluation (Signed)
Speech Language Pathology Evaluation Patient Details Name: Cheryl Massey MRN: [id number] DOB: [birth year] Today's Date: 10/12/2019 Time: [id number] SLP Time Calculation (min) (ACUTE ONLY): 27 min  Problem List:  Patient Active Problem List   Diagnosis Date Noted  . CVA (cerebral vascular accident) ([place]) 10/11/2019  . CVA (cerebrovascular accident) ([place]) 10/11/2019  . TIA (transient ischemic attack) 03/28/2019  . Essential hypertension 03/28/2019  . Type 2 diabetes mellitus treated without insulin ([place]) 03/28/2019   Past Medical History:  Past Medical History:  Diagnosis Date  . Diabetes mellitus without complication ([place])   . Hypertension    Past Surgical History:  Past Surgical History:  Procedure Laterality Date  . ABDOMINAL HYSTERECTOMY    . APPENDECTOMY     HPI:  Daughter states approximately 10 days ago she was having difficulty expressing herself which worsened over time. 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 Had previous CVA April 2020. [place] completed on 03/29/2019 during prior admission was scored WNL, no f/u recommended.    Assessment / Plan / Recommendation Clinical Impression  Pt demonstrates a moderate to severe aphasia with expressive greater than receptive deficits. Pt is able to follow 75% of one step commands to point to parts of the body named in spanish, Could not follow more complex step commands. Answered Y/N biographical questions correctly, but not complex questions. Identified 4/4 pictures. Benefits from gestures and visual cues to enhance auditory comprehenesion. Pt is only able to spontanousaly verbalize the word "No" but with MIT interventions she verbalized during counting, singing and with a memorized prayer. Needs further diagnostic testing with reading and writing. Discussed successful strategies with daughter over the phone. Recommend intensive therapeutic interventions. CIR if possible. Will  f/u for further acute efforts.     SLP Assessment  SLP Recommendation/Assessment: Patient needs continued Speech Lanaguage Pathology Services SLP Visit Diagnosis: Aphasia (R47.01)    Follow Up Recommendations  Inpatient Rehab    Frequency and Duration min 2x/week  2 weeks      SLP Evaluation Cognition  Overall Cognitive Status: Within Functional Limits for tasks assessed Arousal/Alertness: Awake/alert Orientation Level: Oriented to person;Oriented to place Attention: Focused;Sustained Focused Attention: Appears intact Sustained Attention: Appears intact Memory: (UTA) Awareness: Appears intact       Comprehension  Auditory Comprehension Overall Auditory Comprehension: Impaired Yes/No Questions: Impaired Basic Biographical Questions: 76-100% accurate Basic Immediate Environment Questions: 75-100% accurate Complex Questions: 25-49% accurate Commands: Impaired One Step Basic Commands: 50-74% accurate Conversation: Simple Visual Recognition/Discrimination Discrimination: Within Function Limits Reading Comprehension Reading Status: Not tested    Expression Verbal Expression Overall Verbal Expression: Impaired Initiation: Impaired Automatic Speech: Counting Level of Generative/Spontaneous Verbalization: Word Repetition: Impaired Level of Impairment: Word level Naming: Impairment Responsive: Not tested Confrontation: Impaired Convergent: Not tested Divergent: Not tested Effective Techniques: Melodic intonation Written Expression Dominant Hand: Right   Oral / Motor  Oral Motor/Sensory Function Overall Oral Motor/Sensory Function: Mild impairment Facial ROM: Reduced right Facial Symmetry: Abnormal symmetry right Facial Strength: Reduced right Facial Sensation: Reduced right Lingual ROM: Within Functional Limits Motor Speech Overall Motor Speech: Impaired Respiration: Within functional limits Phonation: Normal Resonance: Within functional limits Articulation:  Impaired Level of Impairment: Word Intelligibility: Intelligibility reduced   GO                   Herbie Baltimore, MA CCC-SLP  Acute Rehabilitation Services Pager [phone] Office [phone]  Lynann Beaver 10/12/2019, 10:47 AM

## 2019-10-12 NOTE — Evaluation (Signed)
Physical Therapy Evaluation Patient Details Name: Cheryl Massey MRN: [id number] DOB: [birth year] Today's Date: 10/12/2019   History of Present Illness  Cheryl Massey is a 63 y.o. female with past medical history of stroke involving left temporal, occipital and parietal lobes.  Reorts 10 days ago she was having difficulty expressing herself which worsened over time.  On admission was noted to have right facial droop, decreased sensation on the right face, arm and leg and also weakness of the right arm and leg.  CT head revealed new Left MCA occlusion and stroke.  Clinical Impression  Patient presents with decreased independence with mobility due to R sided weakness, decreased communication and limited safety.  Currently min to minguard A for all mobility and pt has assist from family at home.  Feel she can progress to be able to d/c home with family support and follow up HHPT.  PT to follow acutely.     Follow Up Recommendations Home health PT;Supervision/Assistance - 24 hour    Equipment Recommendations  Other (comment)(TBA cane versus walker)    Recommendations for Other Services       Precautions / Restrictions Precautions Precautions: Fall Precaution Comments: expressive aphasia, Spanish speaking Restrictions Weight Bearing Restrictions: No      Mobility  Bed Mobility Overal bed mobility: Needs Assistance Bed Mobility: Supine to Sit     Supine to sit: Min guard     General bed mobility comments: assist for balance coming upright with increased time and effort  Transfers Overall transfer level: Needs assistance Equipment used: None Transfers: Sit to/from Stand Sit to Stand: Min guard         General transfer comment: for balance pt coming upright prior to being asked  Ambulation/Gait Ambulation/Gait assistance: Min guard Gait Distance (Feet): 130 Feet Assistive device: None;1 person hand held assist Gait Pattern/deviations:  Step-through pattern;Decreased stride length;Decreased stance time - right     General Gait Details: slow, mildly unsteady, more steady with L HHA versus R, but denied improvement with HHA when asked  Stairs            Wheelchair Mobility    Modified Rankin (Stroke Patients Only) Modified Rankin (Stroke Patients Only) Pre-Morbid Rankin Score: No significant disability Modified Rankin: Moderately severe disability     Balance Overall balance assessment: Needs assistance   Sitting balance-Leahy Scale: Fair Sitting balance - Comments: close S/minguard when attempting to don socks   Standing balance support: No upper extremity supported Standing balance-Leahy Scale: Fair Standing balance comment: static standing without support                             Pertinent Vitals/Pain Pain Assessment: No/denies pain    Home Living Family/patient expects to be discharged to:: Private residence Living Arrangements: Spouse/significant other;Children Available Help at Discharge: Family;Available 24 hours/day Type of Home: House Home Access: Stairs to enter Entrance Stairs-Rails: Right Entrance Stairs-Number of Steps: 2-3 Home Layout: One level Home Equipment: None Additional Comments: Information collected via interpreter on the phone    Prior Function Level of Independence: Independent         Comments: ADLs and IADL; not driving     Hand Dominance   Dominant Hand: Right    Extremity/Trunk Assessment        Lower Extremity Assessment Lower Extremity Assessment: RLE deficits/detail RLE Deficits / Details: AAROM WFL except some difficulty with hip rotation bilaterally when attempting  to cross legs to don socks; strength hip flexion 2+/5, knee extension 4-/5 RLE Sensation: decreased light touch(questionable decreased sensation with inconsistent responses with interpreter and yes/no) RLE Coordination: decreased gross motor(slowed coordination with  strength deficits)       Communication   Communication: Interpreter utilized  Cognition Arousal/Alertness: Awake/alert Behavior During Therapy: WFL for tasks assessed/performed Overall Cognitive Status: Within Functional Limits for tasks assessed                                 General Comments: slow to follow commands due to interpreter delay, expressive aphasia      General Comments General comments (skin integrity, edema, etc.): easily frustrated by deficits; handoff to OT end of session    Exercises     Assessment/Plan    PT Assessment Patient needs continued PT services  PT Problem List Decreased strength;Decreased mobility;Decreased balance;Decreased coordination;Decreased safety awareness       PT Treatment Interventions Stair training;Therapeutic activities;Balance training;Therapeutic exercise;Functional mobility training;Gait training;Patient/family education    PT Goals (Current goals can be found in the Care Plan section)  Acute Rehab PT Goals Patient Stated Goal: none stated PT Goal Formulation: With patient Time For Goal Achievement: 10/26/19 Potential to Achieve Goals: Good    Frequency Min 4X/week   Barriers to discharge        Co-evaluation               AM-PAC PT "6 Clicks" Mobility  Outcome Measure Help needed turning from your back to your side while in a flat bed without using bedrails?: A Little Help needed moving from lying on your back to sitting on the side of a flat bed without using bedrails?: A Little Help needed moving to and from a bed to a chair (including a wheelchair)?: A Little Help needed standing up from a chair using your arms (e.g., wheelchair or bedside chair)?: A Little Help needed to walk in hospital room?: A Little Help needed climbing 3-5 steps with a railing? : A Little 6 Click Score: 18    End of Session Equipment Utilized During Treatment: Gait belt Activity Tolerance: Patient tolerated treatment  well Patient left: Other (comment)(standing at sink with OT)   PT Visit Diagnosis: Other abnormalities of gait and mobility (R26.89);Hemiplegia and hemiparesis Hemiplegia - Right/Left: Right Hemiplegia - dominant/non-dominant: Dominant    Time: [id number] PT Time Calculation (min) (ACUTE ONLY): 23 min   Charges:   PT Evaluation $PT Eval Moderate Complexity: [place], [place] Acute Rehabilitation Services [phone] 10/12/2019   Reginia Naas 10/12/2019, 11:26 AM

## 2019-10-12 NOTE — Progress Notes (Signed)
Letter mailed

## 2019-10-12 NOTE — Progress Notes (Signed)
Occupational Therapy Evaluation Patient Details Name: Cheryl Massey MRN: [id number] DOB: 1955-12-28 Today's Date: 10/12/2019    History of Present Illness Cheryl Massey is a 63 y.o. female with past medical history of stroke involving left temporal, occipital and parietal lobes.  Reorts 10 days ago she was having difficulty expressing herself which worsened over time.  On admission was noted to have right facial droop, decreased sensation on the right face, arm and leg and also weakness of the right arm and leg.  CT head revealed new Left MCA occlusion and stroke.   Clinical Impression   PTA, pt lived with husband and daughter in one story home and was independent for ADLs and IADLs; information collected over the phone from the daughter. Pt presenting with decreased cognition, including attention, awareness, safety, problem solving, and following commands. Pt also presents with decreased RUE strength, coordination, activity tolerance, and balance. Pt required min guard A during oral care, supervision-min guard A for UB ADLs and functional mobility, and mod A-max A for LB ADLs. Pt would benefit from receiving OT acutely to facilitate safe dc. Recommend dc home with HHOT to address deficits and increase safe performance of ADLs.     Follow Up Recommendations  Home health OT;Supervision/Assistance - 24 hour    Equipment Recommendations  3 in 1 bedside commode    Recommendations for Other Services PT consult     Precautions / Restrictions Precautions Precautions: Fall Precaution Comments: expressive aphasia, Spanish speaking Restrictions Weight Bearing Restrictions: No      Mobility Bed Mobility Overal bed mobility: Needs Assistance Bed Mobility: Supine to Sit     Supine to sit: Min guard     General bed mobility comments: requires min guard A for balance coming upright with increased time and effort  Transfers Overall transfer level: Needs  assistance Equipment used: None Transfers: Sit to/from Stand Sit to Stand: Min guard         General transfer comment: Pt stood prior to being asked. Required min guard A for safety and balance    Balance Overall balance assessment: Needs assistance Sitting-balance support: No upper extremity supported;Feet supported Sitting balance-Leahy Scale: Fair Sitting balance - Comments: close S/minguard when attempting to don socks   Standing balance support: No upper extremity supported Standing balance-Leahy Scale: Fair Standing balance comment: static standing without support. Demonstrated L lean when standing to offload RLE                           ADL either performed or assessed with clinical judgement   ADL Overall ADL's : Needs assistance/impaired Eating/Feeding: Sitting;Supervision/ safety   Grooming: Set up;Oral care;Wash/dry face;Standing Grooming Details (indicate cue type and reason): When cued to brush her teeth, pt reached for toothpaste, squirted toothpaste onto her finger, and attempted to brush her teeth with her finger. Pt required max VCs and set up to open toothbrush to engage in brushing teeth.  Upper Body Bathing: Sitting;Min guard   Lower Body Bathing: Moderate assistance;Sit to/from stand   Upper Body Dressing : Min guard;Sitting   Lower Body Dressing: Maximal assistance;Sit to/from stand Lower Body Dressing Details (indicate cue type and reason): provided education on figure 4 method for LB ADLs. Pt unable to perform figure 4 technique. Pt unable to reach down to feet to don socks, requiring max A to don socks sitting EOB Toilet Transfer: Min guard;Ambulation(simulated to recliner)   Toileting- Clothing Manipulation and  Hygiene: Min guard;Sit to/from stand       Functional mobility during ADLs: Min guard General ADL Comments: pt requiring supervision-min guard A for UB ADls and functional mobility, and mod A - max A for LB ADLs for safety.      Vision Baseline Vision/History: Wears glasses Wears Glasses: Reading only Patient Visual Report: No change from baseline Vision Assessment?: No apparent visual deficits Additional Comments: Pt denies diplopia/blurry vision     Perception     Praxis      Pertinent Vitals/Pain Pain Assessment: No/denies pain     Hand Dominance Right   Extremity/Trunk Assessment Upper Extremity Assessment Upper Extremity Assessment: RUE deficits/detail;LUE deficits/detail RUE Deficits / Details: Pt had ROM WFL for shoulder flexion/extension and elbow flexion/extension. Pt demonstrated difficulty with composite flexion, and slow processing for composite extension. Unable to perform thumb oppostion, however unsure if this is due to difficulty with coordination or cognition. Pt expressed difficulty using R hand to open containers and turn on/off water at sink RUE Coordination: decreased fine motor LUE Deficits / Details: Pt had ROM WFL for shoulder flexion/extension and elbow flexion/extension. Pt demonstrated difficulty with composite flexion, and slow processing for composite extension. Unable to perform thumb oppostion, however unsure if this is due to difficulty with coordination or cognition.  LUE Coordination: decreased fine motor   Lower Extremity Assessment Lower Extremity Assessment: Defer to PT evaluation RLE Deficits / Details: AAROM WFL except some difficulty with hip rotation bilaterally when attempting to cross legs to don socks; strength hip flexion 2+/5, knee extension 4-/5 RLE Sensation: decreased light touch(questionable decreased sensation with inconsistent responses with interpreter and yes/no) RLE Coordination: decreased gross motor(slowed coordination with strength deficits)   Cervical / Trunk Assessment Cervical / Trunk Assessment: Kyphotic   Communication Communication Communication: Interpreter utilized;Receptive difficulties;Expressive difficulties   Cognition  Arousal/Alertness: Awake/alert Behavior During Therapy: WFL for tasks assessed/performed Overall Cognitive Status: Impaired/Different from baseline Area of Impairment: Following commands;Safety/judgement;Awareness;Problem solving;Attention                   Current Attention Level: Sustained   Following Commands: Follows one step commands inconsistently;Follows one step commands with increased time Safety/Judgement: Decreased awareness of safety Awareness: Emergent Problem Solving: Slow processing;Decreased initiation;Difficulty sequencing;Requires verbal cues;Requires tactile cues General Comments: Pt struggled to follow simple commands, and had decreased initiation and slow processing as seen by difficulty following steps for oral care. Pt attempted to brush her teeth using her finger, showing decreased problem solving to look for a toothbrush. Required max VCs, tactile cues, and min A to open toothbrush to brush teeth; able to successfully brush teeth with proper utensils. Pt demonstrated decreased safety, however showed awareness of deficits as seen by her becoming fustrated and tearful, reporting that things feel hard and different from normal   General Comments  Pt demonstrated frustration by deficits throughout session    Exercises     Shoulder Instructions      Home Living Family/patient expects to be discharged to:: Private residence Living Arrangements: Spouse/significant other;Children Available Help at Discharge: Family;Available 24 hours/day Type of Home: House Home Access: Stairs to enter [employer] of Steps: 2-3 Entrance Stairs-Rails: Right Home Layout: One level     Bathroom Shower/Tub: Teacher, early years/pre: Standard Bathroom Accessibility: Yes   Home Equipment: None   Additional Comments: Information collected via interpreter on the phone      Prior Functioning/Environment Level of Independence: Independent  Comments: ADLs and IADL; not driving        OT Problem List: Decreased strength;Decreased range of motion;Decreased activity tolerance;Impaired balance (sitting and/or standing);Decreased coordination;Decreased cognition;Decreased safety awareness;Decreased knowledge of use of DME or AE;Decreased knowledge of precautions;Impaired UE functional use;Pain      OT Treatment/Interventions: Self-care/ADL training;Energy conservation;Therapeutic activities;Cognitive remediation/compensation;Patient/family education;Balance training    OT Goals(Current goals can be found in the care plan section) Acute Rehab OT Goals Patient Stated Goal: none stated OT Goal Formulation: With patient Time For Goal Achievement: 10/26/19 Potential to Achieve Goals: Good  OT Frequency: Min 3X/week   Barriers to D/C:            Co-evaluation PT/OT/SLP Co-Evaluation/Treatment: Yes Reason for Co-Treatment: To address functional/ADL transfers PT goals addressed during session: Mobility/safety with mobility OT goals addressed during session: ADL's and self-care      AM-PAC OT "6 Clicks" Daily Activity     Outcome Measure Help from another person eating meals?: None Help from another person taking care of personal grooming?: A Little Help from another person toileting, which includes using toliet, bedpan, or urinal?: A Little Help from another person bathing (including washing, rinsing, drying)?: A Little Help from another person to put on and taking off regular upper body clothing?: A Little Help from another person to put on and taking off regular lower body clothing?: A Lot 6 Click Score: 18   End of Session Equipment Utilized During Treatment: Gait belt Nurse Communication: Mobility status  Activity Tolerance: Patient tolerated treatment well Patient left: in chair;with call bell/phone within reach;with chair alarm set  OT Visit Diagnosis: Muscle weakness (generalized) (M62.81);Pain;Other symptoms  and signs involving cognitive function;Other symptoms and signs involving the nervous system (R29.898) Pain - part of body: Hand;Arm                Time: 1100-1140 OT Time Calculation (min): 40 min Charges:  OT General Charges $OT Visit: 1 Visit OT Evaluation $OT Eval Moderate Complexity: 1 Mod OT Treatments $Self Care/Home Management : 8-22 mins  Gus Rankin, OT Student  Di Kindle Joie Reamer 10/12/2019, 1:41 PM

## 2019-10-12 NOTE — Progress Notes (Signed)
PROGRESS NOTE    Cheryl Massey  [id number] DOB: 1956-11-29 DOA: 10/11/2019 PCP: System, Pcp Not In    Brief Narrative:  63 year old Spanish-speaking female with history of left MCA stroke and right hemiparesis presents to the emergency room with about 10 days of difficulty expressing herself worsened over time.  She was also noted to have right facial droop and decreased sensation on the right face arm and leg.  In the emergency room hemodynamically stable.  CTA of the head shows new left MCA occlusion in the stroke.  Neurology consulted.  Unknown onset of duration, she was not candidate for TPA.   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 Continue to monitor in the stroke unit .Neurochecks and vital signs as per stroke protocol. Patient was not a TPA and vascular intervention candidate because of unknown duration of onset and recently stroke. Passed swallow evaluation, remains on dysphagia diet. Antiplatelets, on aspirin at home.  Currently on aspirin Plavix.  Neurology recommended 3 months of aspirin and Plavix then aspirin alone. LDL is 125, on Lipitor 40 mg daily that we will continue. Blood pressure at goal. Consultations, neurology, speech, PT OT 2D echocardiogram, pending PT OT recommended home with home health PT OT. MRI brain pending.  Type 2 diabetes with hyperglycemia: A1c is 8.8.  On oral hypoglycemics and Metformin.  They were on hold today.  On insulin.  Discussed compliance.  She would like to stay on oral hypoglycemics.  Hypertension: On losartan that she will continue.  Headache: Recently started on Topamax.  That she will continue  DVT prophylaxis: Lovenox subcu Code Status: Full code Family Communication:  None.  Spanish interpreter used but difficulty due to expressive aphasia. Disposition Plan: To be decided.  Anticipate home with therapies tomorrow.   Consultants:   Neurology  Procedures:   None  Antimicrobials:   None   Subjective: Patient seen and examined in the morning rounds.  Patient was very anxious.  She was unable to express.  Used interpreter and answered yes or no.  She complaint of anxiety.  No other overnight events.  Objective: Vitals:   10/12/19 0349 10/12/19 0820 10/12/19 1100 10/12/19 1643  BP: 116/85 (!) 144/76 130/88 (!) 108/98  Pulse: 75 75 68 83  Resp: 18 16 16 16   Temp: 98.1 F (36.7 C) 98.2 F (36.8 C) 98.3 F (36.8 C) (!) 97.5 F (36.4 C)  TempSrc: Axillary Oral Oral Oral  SpO2: 100% 100% 100% 96%  Weight:      Height:        Intake/Output Summary (Last 24 hours) at 10/12/2019 1812 Last data filed at 10/12/2019 0840 Gross per 24 hour  Intake 621.67 ml  Output --  Net 621.67 ml   Filed Weights   10/11/19 1324  Weight: 72 kg    Examination:  General exam: Appears calm and comfortable, anxious Respiratory system: Clear to auscultation. Respiratory effort normal. Cardiovascular system: S1 & S2 heard, RRR. No JVD, murmurs, rubs, gallops or clicks. No pedal edema. Gastrointestinal system: Abdomen is nondistended, soft and nontender. No organomegaly or masses felt. Normal bowel sounds heard. Central nervous system: Alert and oriented as per following commands.  Right facial droop.  Right upper extremity 4/5. Extremities: Symmetric 5 x  5 power. Skin: No rashes, lesions or ulcers Psychiatry: Judgement and insight appear normal. Mood & affect flat and anxious.    Data Reviewed: I have personally reviewed following labs and imaging studies  CBC: Recent Labs  Lab 10/11/19 1334 10/11/19 1337  WBC 5.6  --   NEUTROABS 3.4  --   HGB 13.5 13.6  HCT 39.0 40.0  MCV 87.4  --   PLT 287  --    Basic Metabolic Panel: Recent Labs  Lab  10/11/19 1334 10/11/19 1337  NA 135 136  K 4.1 4.2  CL 105 106  CO2 18*  --   GLUCOSE 326* 319*  BUN 17 18  CREATININE 0.90 0.80  CALCIUM 9.4  --    GFR: Estimated Creatinine Clearance: 69.3 mL/min (by C-G formula based on SCr of 0.8 mg/dL). Liver Function Tests: Recent Labs  Lab 10/11/19 1334  AST 20  ALT 18  ALKPHOS 71  BILITOT 0.5  PROT 7.2  ALBUMIN 4.0   No results for input(s): LIPASE, AMYLASE in the last 168 hours. No results for input(s): AMMONIA in the last 168 hours. Coagulation Profile: Recent Labs  Lab 10/11/19 1334  INR 1.0   Cardiac Enzymes: No results for input(s): CKTOTAL, CKMB, CKMBINDEX, TROPONINI in the last 168 hours. BNP (last 3 results) No results for input(s): PROBNP in the last 8760 hours. HbA1C: Recent Labs    10/12/19 0329  HGBA1C 8.8*   CBG: Recent Labs  Lab 10/11/19 2356 10/12/19 0407 10/12/19 0910 10/12/19 1231 10/12/19 1733  GLUCAP 120* 155* 160* 85 263*   Lipid Profile: Recent Labs    10/12/19 0329  CHOL 228*  HDL 44  LDLCALC 125*  TRIG 293*  CHOLHDL 5.2   Thyroid Function Tests: No results for input(s): TSH, T4TOTAL, FREET4, T3FREE, THYROIDAB in the last 72 hours. Anemia Panel: No results for input(s): VITAMINB12, FOLATE, FERRITIN, TIBC, IRON, RETICCTPCT in the last 72 hours. Sepsis Labs: No results for input(s): PROCALCITON, LATICACIDVEN in the last 168 hours.  Recent Results (from the past 240 hour(s))  SARS CORONAVIRUS 2 (TAT 6-24 HRS) Nasopharyngeal Nasopharyngeal Swab     Status: None   Collection Time: 10/11/19  3:17 PM   Specimen: Nasopharyngeal Swab  Result Value Ref Range Status   SARS Coronavirus 2 NEGATIVE NEGATIVE Final    Comment: (NOTE) SARS-CoV-2 target nucleic acids are NOT DETECTED. The SARS-CoV-2 RNA is generally detectable in upper and lower respiratory specimens during the acute phase of infection. Negative results do not preclude SARS-CoV-2 infection, do not rule out co-infections with  other pathogens, and should not be used as the sole basis for treatment or other patient management decisions. Negative results must be combined with clinical observations, patient history, and epidemiological information. The expected result is Negative. Fact Sheet for Patients: SugarRoll.be Fact Sheet for Healthcare Providers: https://www.woods-mathews.com/ This test is not yet approved or cleared by the [place] FDA and  has been authorized for detection and/or diagnosis of SARS-CoV-2 by FDA under an Emergency Use Authorization (EUA). This EUA will remain  in effect (meaning this test can be used) for the duration of the COVID-19 declaration under Section 56 4(b)(1) of the Act, 21 U.S.C. section 360bbb-3(b)(1), unless the authorization is terminated or revoked sooner. Performed at [facility], [place] [address], [place], [place] [address]          Radiology Studies: Ct Angio Head W Or Wo Contrast  Result Date: 10/11/2019 CLINICAL DATA:  Right-sided weakness. Unable to  speak. EXAM: CT ANGIOGRAPHY HEAD AND NECK CT PERFUSION BRAIN TECHNIQUE: Multidetector CT imaging of the head and neck was performed using the standard protocol during bolus administration of intravenous contrast. Multiplanar CT image reconstructions and MIPs were obtained to evaluate the vascular anatomy. Carotid stenosis measurements (when applicable) are obtained utilizing NASCET criteria, using the distal internal carotid diameter as the denominator. Multiphase CT imaging of the brain was performed following IV bolus contrast injection. Subsequent parametric perfusion maps were calculated using RAPID software. CONTRAST:  15mL OMNIPAQUE IOHEXOL 350 MG/ML SOLN COMPARISON:  Head CT earlier same day FINDINGS: CTA NECK FINDINGS Aortic arch: Mild aortic atherosclerosis. No aneurysm or dissection. Left vertebral artery arises directly from the arch. Right carotid system:  Common carotid artery widely patent to the bifurcation. Carotid bifurcation is normal without soft or calcified plaque. Cervical ICA is mildly tortuous but widely patent. Left carotid system: Common carotid artery widely patent to the bifurcation. Carotid bifurcation is normal without soft or calcified plaque. Cervical ICA is tortuous but widely patent. Vertebral arteries: Both vertebral arteries are widely patent. Right vertebral artery origin is normal. Left vertebral artery arises from the arch as noted above. Skeleton: Normal Other neck: No mass or adenopathy. Upper chest: Normal Review of the MIP images confirms the above findings CTA HEAD FINDINGS Anterior circulation: Both internal carotid arteries are patent through the skull base and siphon regions. No siphon stenosis. On the right, the anterior and middle cerebral arteries are patent. No large or medium vessel occlusion. On the left, the anterior cerebral artery is patent and normal appearing. The left middle cerebral artery is occluded in the proximal M1 segment. In April of this year, this vessel was stenotic but not occluded. Distal branch vessels do show some opacification presumably secondary to collateral supply. Posterior circulation: Both vertebral arteries are patent to the basilar. No basilar stenosis. Posterior circulation branch vessels are normal. Venous sinuses: Patent and normal. Anatomic variants: None significant. Review of the MIP images confirms the above findings CT Brain Perfusion Findings: ASPECTS: 8 CBF (<30%) Volume: 44mL Perfusion (Tmax>6.0s) volume: 11mL Mismatch Volume: 38mL Infarction Location:Late acute infarction in the left temporal and parietal region which is showing luxury perfusion, with artifactual absence of diminished CBF. Cerebral blood volume increased in the region of infarction. Considering that, the region appears fairly well matched to the area prolonged T-max. IMPRESSION: 1. No carotid bifurcation disease on  either side. 2. 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 In April this was stenotic but patent. Distal branch vessels show some opacification presumably secondary to collateral supply. 3. CT perfusion shows a 14 CC region of T-max greater than 6 seconds in the left temporal and parietal region which is showing luxury perfusion. This is consistent with completed infarction with luxury perfusion. Considering that, the region appears fairly well matched to the area of infarction and I doubt that there is a large region of continued at risk brain. 4. Both vertebral arteries are widely patent to the basilar. Left vertebral artery arises directly from the arch. Electronically Signed   By: Nelson Chimes M.D.   On: 10/11/2019 15:15   Ct Angio Neck W Or Wo Contrast  Result Date: 10/11/2019 CLINICAL DATA:  Right-sided weakness. Unable to speak. EXAM: CT ANGIOGRAPHY HEAD AND NECK CT PERFUSION BRAIN TECHNIQUE: Multidetector CT imaging of the head and neck was performed using the standard protocol during bolus administration of intravenous contrast. Multiplanar CT image reconstructions and MIPs were obtained to evaluate the  vascular anatomy. Carotid stenosis measurements (when applicable) are obtained utilizing NASCET criteria, using the distal internal carotid diameter as the denominator. Multiphase CT imaging of the brain was performed following IV bolus contrast injection. Subsequent parametric perfusion maps were calculated using RAPID software. CONTRAST:  17mL OMNIPAQUE IOHEXOL 350 MG/ML SOLN COMPARISON:  Head CT earlier same day FINDINGS: CTA NECK FINDINGS Aortic arch: Mild aortic atherosclerosis. No aneurysm or dissection. Left vertebral artery arises directly from the arch. Right carotid system: Common carotid artery widely patent to the bifurcation. Carotid bifurcation is normal without soft or calcified plaque. Cervical ICA is mildly tortuous but widely patent. Left carotid system: Common  carotid artery widely patent to the bifurcation. Carotid bifurcation is normal without soft or calcified plaque. Cervical ICA is tortuous but widely patent. Vertebral arteries: Both vertebral arteries are widely patent. Right vertebral artery origin is normal. Left vertebral artery arises from the arch as noted above. Skeleton: Normal Other neck: No mass or adenopathy. Upper chest: Normal Review of the MIP images confirms the above findings CTA HEAD FINDINGS Anterior circulation: Both internal carotid arteries are patent through the skull base and siphon regions. No siphon stenosis. On the right, the anterior and middle cerebral arteries are patent. No large or medium vessel occlusion. On the left, the anterior cerebral artery is patent and normal appearing. The left middle cerebral artery is occluded in the proximal M1 segment. In April of this year, this vessel was stenotic but not occluded. Distal branch vessels do show some opacification presumably secondary to collateral supply. Posterior circulation: Both vertebral arteries are patent to the basilar. No basilar stenosis. Posterior circulation branch vessels are normal. Venous sinuses: Patent and normal. Anatomic variants: None significant. Review of the MIP images confirms the above findings CT Brain Perfusion Findings: ASPECTS: 8 CBF (<30%) Volume: 93mL Perfusion (Tmax>6.0s) volume: 19mL Mismatch Volume: 42mL Infarction Location:Late acute infarction in the left temporal and parietal region which is showing luxury perfusion, with artifactual absence of diminished CBF. Cerebral blood volume increased in the region of infarction. Considering that, the region appears fairly well matched to the area prolonged T-max. IMPRESSION: 1. No carotid bifurcation disease on either side. 2. 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 In April this was stenotic but patent. Distal branch vessels show some opacification presumably secondary to collateral  supply. 3. CT perfusion shows a 14 CC region of T-max greater than 6 seconds in the left temporal and parietal region which is showing luxury perfusion. This is consistent with completed infarction with luxury perfusion. Considering that, the region appears fairly well matched to the area of infarction and I doubt that there is a large region of continued at risk brain. 4. Both vertebral arteries are widely patent to the basilar. Left vertebral artery arises directly from the arch. Electronically Signed   By: Nelson Chimes M.D.   On: 10/11/2019 15:15   Ct Cerebral Perfusion W Contrast  Result Date: 10/11/2019 CLINICAL DATA:  Right-sided weakness. Unable to speak. EXAM: CT ANGIOGRAPHY HEAD AND NECK CT PERFUSION BRAIN TECHNIQUE: Multidetector CT imaging of the head and neck was performed using the standard protocol during bolus administration of intravenous contrast. Multiplanar CT image reconstructions and MIPs were obtained to evaluate the vascular anatomy. Carotid stenosis measurements (when applicable) are obtained utilizing NASCET criteria, using the distal internal carotid diameter as the denominator. Multiphase CT imaging of the brain was performed following IV bolus contrast injection. Subsequent parametric perfusion maps were calculated using RAPID software. CONTRAST:  16mL OMNIPAQUE IOHEXOL 350 MG/ML SOLN COMPARISON:  Head CT earlier same day FINDINGS: CTA NECK FINDINGS Aortic arch: Mild aortic atherosclerosis. No aneurysm or dissection. Left vertebral artery arises directly from the arch. Right carotid system: Common carotid artery widely patent to the bifurcation. Carotid bifurcation is normal without soft or calcified plaque. Cervical ICA is mildly tortuous but widely patent. Left carotid system: Common carotid artery widely patent to the bifurcation. Carotid bifurcation is normal without soft or calcified plaque. Cervical ICA is tortuous but widely patent. Vertebral arteries: Both vertebral arteries  are widely patent. Right vertebral artery origin is normal. Left vertebral artery arises from the arch as noted above. Skeleton: Normal Other neck: No mass or adenopathy. Upper chest: Normal Review of the MIP images confirms the above findings CTA HEAD FINDINGS Anterior circulation: Both internal carotid arteries are patent through the skull base and siphon regions. No siphon stenosis. On the right, the anterior and middle cerebral arteries are patent. No large or medium vessel occlusion. On the left, the anterior cerebral artery is patent and normal appearing. The left middle cerebral artery is occluded in the proximal M1 segment. In April of this year, this vessel was stenotic but not occluded. Distal branch vessels do show some opacification presumably secondary to collateral supply. Posterior circulation: Both vertebral arteries are patent to the basilar. No basilar stenosis. Posterior circulation branch vessels are normal. Venous sinuses: Patent and normal. Anatomic variants: None significant. Review of the MIP images confirms the above findings CT Brain Perfusion Findings: ASPECTS: 8 CBF (<30%) Volume: 12mL Perfusion (Tmax>6.0s) volume: 31mL Mismatch Volume: 61mL Infarction Location:Late acute infarction in the left temporal and parietal region which is showing luxury perfusion, with artifactual absence of diminished CBF. Cerebral blood volume increased in the region of infarction. Considering that, the region appears fairly well matched to the area prolonged T-max. IMPRESSION: 1. No carotid bifurcation disease on either side. 2. 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 In April this was stenotic but patent. Distal branch vessels show some opacification presumably secondary to collateral supply. 3. CT perfusion shows a 14 CC region of T-max greater than 6 seconds in the left temporal and parietal region which is showing luxury perfusion. This is consistent with completed infarction with  luxury perfusion. Considering that, the region appears fairly well matched to the area of infarction and I doubt that there is a large region of continued at risk brain. 4. Both vertebral arteries are widely patent to the basilar. Left vertebral artery arises directly from the arch. Electronically Signed   By: Nelson Chimes M.D.   On: 10/11/2019 15:15   Ct Head Code Stroke Wo Contrast  Result Date: 10/11/2019 CLINICAL DATA:  Code stroke. Suspicion of cerebral hemorrhage. Specific symptoms not described. EXAM: CT HEAD WITHOUT CONTRAST TECHNIQUE: Contiguous axial images were obtained from the base of the skull through the vertex without intravenous contrast. COMPARISON:  MRI 03/28/2019.  CT 03/28/2019. FINDINGS: Brain: Old infarctions affect the left basal ganglia. There is acute infarction affecting the left posterior temporal and parietal cortical and subcortical brain consistent with MCA branch vessel territory infarction. This is probably about 72-day-old based on the low density. No sign of hemorrhage. No mass effect related to the infarction. 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 No obstructive hydrocephalus overall. No extra-axial fluid  collection. Vascular: No abnormal vascular finding by CT. Skull: Negative Sinuses/Orbits: Clear except for mild mucosal thickening in the maxillary and ethmoid sinuses. Orbits negative. Other: None ASPECTS ([place] Stroke Program Early CT Score) - Ganglionic level infarction (caudate, lentiform nuclei, internal capsule, insula, M1-M3 cortex): 6 - Supraganglionic infarction (M4-M6 cortex): 2 Total score (0-10 with 10 being normal): 8 IMPRESSION: 1. Areas of acute infarction in the left posterior temporal and parietal cortical and subcortical brain. Low-density suggesting that the stroke may be  63-day-old. No sign of hemorrhage or mass effect. Old infarctions in the left basal ganglia. 2. 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 Pituitary adenoma versus meningioma. 3. ASPECTS is 8 4. These results were communicated to Dr. Leonel Ramsay at 1:50 pmon 10/27/2020by text page via the [facility] messaging system. Electronically Signed   By: Nelson Chimes M.D.   On: 10/11/2019 13:54        Scheduled Meds:   stroke: mapping our early stages of recovery book   Does not apply Once   aspirin EC  81 mg Oral Daily   atorvastatin  40 mg Oral q1800   clopidogrel  75 mg Oral Daily   enoxaparin (LOVENOX) injection  40 mg Subcutaneous Q24H   insulin aspart  0-15 Units Subcutaneous Q4H   losartan  25 mg Oral Daily   senna-docusate  1 tablet Oral QHS   topiramate  25 mg Oral BID   Continuous Infusions:  sodium chloride 50 mL (10/12/19 1729)     LOS: 1 day    Time spent: 25 minutes    Barb Merino, MD [facility] Pager [phone]

## 2019-10-13 ENCOUNTER — Inpatient Hospital Stay (HOSPITAL_COMMUNITY): Payer: Medicaid Other

## 2019-10-13 DIAGNOSIS — I6389 Other cerebral infarction: Secondary | ICD-10-CM

## 2019-10-13 LAB — GLUCOSE, CAPILLARY
Glucose-Capillary: 119 mg/dL — ABNORMAL HIGH (ref 70–99)
Glucose-Capillary: 124 mg/dL — ABNORMAL HIGH (ref 70–99)
Glucose-Capillary: 184 mg/dL — ABNORMAL HIGH (ref 70–99)
Glucose-Capillary: 233 mg/dL — ABNORMAL HIGH (ref 70–99)
Glucose-Capillary: 96 mg/dL (ref 70–99)

## 2019-10-13 LAB — ECHOCARDIOGRAM COMPLETE
Height: 63 in
Weight: 2539.7 oz

## 2019-10-13 MED ORDER — LOSARTAN POTASSIUM 25 MG PO TABS: 25.0000 mg | ORAL_TABLET | Freq: Every day | ORAL | 0 refills | Status: DC

## 2019-10-13 MED ORDER — ASPIRIN 81 MG PO TBEC: 81.0000 mg | DELAYED_RELEASE_TABLET | Freq: Every day | ORAL | 0 refills | Status: DC

## 2019-10-13 MED ORDER — GLIPIZIDE-METFORMIN HCL 5-500 MG PO TABS: 1.0000 | ORAL_TABLET | Freq: Two times a day (BID) | ORAL | 0 refills | Status: DC

## 2019-10-13 MED ORDER — CLOPIDOGREL BISULFATE 75 MG PO TABS: 75.0000 mg | ORAL_TABLET | Freq: Every day | ORAL | 0 refills | Status: DC

## 2019-10-13 MED ORDER — ATORVASTATIN CALCIUM 40 MG PO TABS: 40.0000 mg | ORAL_TABLET | Freq: Every day | ORAL | 0 refills | Status: DC

## 2019-10-13 NOTE — Progress Notes (Signed)
  Echocardiogram 2D Echocardiogram has been performed.  Jannett Celestine 10/13/2019, 2:20 PM

## 2019-10-13 NOTE — Discharge Summary (Signed)
Physician Discharge Summary  Rie Wendler McLeod Polanco [id number] DOB: Jun 26, 1956 DOA: 10/11/2019  PCP: System, Pcp Not In  Admit date: 10/11/2019 Discharge date: 10/13/2019  Admitted From: home  Disposition:  Home   Recommendations for Outpatient Follow-up:  1. Follow up with PCP in 1-2 weeks 2. Follow-up with neurology, office will schedule.  Home Health: PT OT Equipment/Devices: DME  Discharge Condition: Stable CODE STATUS: Full code Diet recommendation: Low-carb diet  Discharge summary: 63 year old Spanish-speaking female with history of left MCA stroke and right hemiparesis presented to the emergency room with about 10 days of difficulty expressing herself that was worsened over time.  Patient was noted to have right-sided facial droop and decreased sensation on right face and arm and legs.  In the emergency room she was hemodynamically stable.  CTA of the head showed new left MCA occlusion and a stroke in the same region.  Neurology consulted.  Not a TPA candidate because of minimum symptoms and recent stroke. Patient was admitted to the hospital and treated as acute left MCA stroke due to MRI stenosis.  She did very well.  Most of the neurological deficits improved, she has remaining expressive aphasia and mild right hand weakness.  Repeat investigations including echocardiogram was normal.  Neurology recommendations: Antiplatelets, on aspirin at home.  DC on aspirin and  Plavix.  Neurology recommended 3 months of aspirin and Plavix then aspirin alone. LDL is 125, on Lipitor 40 mg daily that she will continue. Blood pressure at goal on losartan. PT OT recommended home with home health PT OT. Type 2 diabetes with hyperglycemia: A1c is 8.8.  On oral hypoglycemics and Metformin. Discussed compliance.  She would like to stay on oral hypoglycemics. Recently started on topamax for headache that she will continue.   Discharge Diagnoses:  Active Problems:   Essential  hypertension   Type 2 diabetes mellitus treated without insulin ([place])   CVA (cerebral vascular accident) ([place])   CVA (cerebrovascular accident) [facility])    Discharge Instructions  Discharge Instructions    Diet Carb Modified   Complete by: As directed    Face-to-face encounter (required for Medicare/Medicaid patients)   Complete by: As directed    I Barb Merino certify that this patient is under my care and that I, or a nurse practitioner or physician's assistant working with me, had a face-to-face encounter that meets the physician face-to-face encounter requirements with this patient on 10/13/2019. The encounter with the patient was in whole, or in part for the following medical condition(s) which is the primary reason for home health care (List medical condition): acute stroke   The encounter with the patient was in whole, or in part, for the following medical condition, which is the primary reason for home health care: acute stroke   I certify that, based on my findings, the following services are medically necessary home health services: Physical therapy   Reason for Medically Necessary Home Health Services: Skilled Nursing- Change/Decline in Patient Status   My clinical findings support the need for the above services: Unsafe ambulation due to balance issues   Further, I certify that my clinical findings support that this patient is homebound due to: Unable to leave home safely without assistance   Home Health   Complete by: As directed    To provide the following care/treatments:  PT OT     Increase activity slowly   Complete by: As directed      Allergies as of 10/13/2019   No Known  Allergies     Medication List    TAKE these medications   aspirin 81 MG EC tablet Take 1 tablet (81 mg total) by mouth daily. What changed: when to take this   atorvastatin 40 MG tablet Commonly known as: LIPITOR Take 1 tablet (40 mg total) by mouth daily at 6 PM.   clopidogrel 75 MG  tablet Commonly known as: PLAVIX Take 1 tablet (75 mg total) by mouth daily. Start taking on: October 14, 2019   glipiZIDE-metformin 5-500 MG tablet Commonly known as: METAGLIP Take 1 tablet by mouth 2 (two) times daily before a meal.   losartan 25 MG tablet Commonly known as: Cozaar Take 1 tablet (25 mg total) by mouth daily.   topiramate 25 MG tablet Commonly known as: TOPAMAX Take 1 tablet (25 mg total) by mouth 2 (two) times daily. What changed: when to take this            Durable Medical Equipment  (From admission, onward)         Start     Ordered   10/13/19 1546  For home use only DME 3 n 1  Once     10/13/19 1545   10/13/19 1546  For home use only DME Cane  Once     10/13/19 1545         Follow-up Information    [facility] Follow up on 11/03/2019.   Specialty: Family Medicine Why: Your appointment times is at 8:50. Please arrive early and bring a picture ID and your current medications. Contact information: [place] [phone] [facility] Follow up.   Why: Use this location for your pharmacy need. They assist with the cost of medications. Contact information: [place] [phone]         No Known Allergies  Consultations:  Neurology   Procedures/Studies: Ct Angio Head W Or Wo Contrast  Result Date: 10/11/2019 CLINICAL DATA:  Right-sided weakness. Unable to speak. EXAM: CT ANGIOGRAPHY HEAD AND NECK CT PERFUSION BRAIN TECHNIQUE: Multidetector CT imaging of the head and neck was performed using the standard protocol during bolus administration of intravenous contrast. Multiplanar CT image reconstructions and MIPs were obtained to evaluate the vascular anatomy. Carotid stenosis measurements (when applicable) are obtained utilizing NASCET criteria, using the distal internal carotid  diameter as the denominator. Multiphase CT imaging of the brain was performed following IV bolus contrast injection. Subsequent parametric perfusion maps were calculated using RAPID software. CONTRAST:  114mL OMNIPAQUE IOHEXOL 350 MG/ML SOLN COMPARISON:  Head CT earlier same day FINDINGS: CTA NECK FINDINGS Aortic arch: Mild aortic atherosclerosis. No aneurysm or dissection. Left vertebral artery arises directly from the arch. Right carotid system: Common carotid artery widely patent to the bifurcation. Carotid bifurcation is normal without soft or calcified plaque. Cervical ICA is mildly tortuous but widely patent. Left carotid system: Common carotid artery widely patent to the bifurcation. Carotid bifurcation is normal without soft or calcified plaque. Cervical ICA is tortuous but widely patent. Vertebral arteries: Both vertebral arteries are widely patent. Right vertebral artery origin is normal. Left vertebral artery arises from the arch as noted above. Skeleton: Normal Other neck: No mass or adenopathy. Upper chest: Normal Review of the MIP images confirms the above findings CTA HEAD FINDINGS Anterior circulation: Both internal carotid arteries are patent through the skull base and siphon regions. No siphon  stenosis. On the right, the anterior and middle cerebral arteries are patent. No large or medium vessel occlusion. On the left, the anterior cerebral artery is patent and normal appearing. The left middle cerebral artery is occluded in the proximal M1 segment. In April of this year, this vessel was stenotic but not occluded. Distal branch vessels do show some opacification presumably secondary to collateral supply. Posterior circulation: Both vertebral arteries are patent to the basilar. No basilar stenosis. Posterior circulation branch vessels are normal. Venous sinuses: Patent and normal. Anatomic variants: None significant. Review of the MIP images confirms the above findings CT Brain Perfusion Findings:  ASPECTS: 8 CBF (<30%) Volume: 3mL Perfusion (Tmax>6.0s) volume: 52mL Mismatch Volume: 67mL Infarction Location:Late acute infarction in the left temporal and parietal region which is showing luxury perfusion, with artifactual absence of diminished CBF. Cerebral blood volume increased in the region of infarction. Considering that, the region appears fairly well matched to the area prolonged T-max. IMPRESSION: 1. No carotid bifurcation disease on either side. 2. 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 In April this was stenotic but patent. Distal branch vessels show some opacification presumably secondary to collateral supply. 3. CT perfusion shows a 14 CC region of T-max greater than 6 seconds in the left temporal and parietal region which is showing luxury perfusion. This is consistent with completed infarction with luxury perfusion. Considering that, the region appears fairly well matched to the area of infarction and I doubt that there is a large region of continued at risk brain. 4. Both vertebral arteries are widely patent to the basilar. Left vertebral artery arises directly from the arch. Electronically Signed   By: Nelson Chimes M.D.   On: 10/11/2019 15:15   Ct Angio Neck W Or Wo Contrast  Result Date: 10/11/2019 CLINICAL DATA:  Right-sided weakness. Unable to speak. EXAM: CT ANGIOGRAPHY HEAD AND NECK CT PERFUSION BRAIN TECHNIQUE: Multidetector CT imaging of the head and neck was performed using the standard protocol during bolus administration of intravenous contrast. Multiplanar CT image reconstructions and MIPs were obtained to evaluate the vascular anatomy. Carotid stenosis measurements (when applicable) are obtained utilizing NASCET criteria, using the distal internal carotid diameter as the denominator. Multiphase CT imaging of the brain was performed following IV bolus contrast injection. Subsequent parametric perfusion maps were calculated using RAPID software. CONTRAST:   159mL OMNIPAQUE IOHEXOL 350 MG/ML SOLN COMPARISON:  Head CT earlier same day FINDINGS: CTA NECK FINDINGS Aortic arch: Mild aortic atherosclerosis. No aneurysm or dissection. Left vertebral artery arises directly from the arch. Right carotid system: Common carotid artery widely patent to the bifurcation. Carotid bifurcation is normal without soft or calcified plaque. Cervical ICA is mildly tortuous but widely patent. Left carotid system: Common carotid artery widely patent to the bifurcation. Carotid bifurcation is normal without soft or calcified plaque. Cervical ICA is tortuous but widely patent. Vertebral arteries: Both vertebral arteries are widely patent. Right vertebral artery origin is normal. Left vertebral artery arises from the arch as noted above. Skeleton: Normal Other neck: No mass or adenopathy. Upper chest: Normal Review of the MIP images confirms the above findings CTA HEAD FINDINGS Anterior circulation: Both internal carotid arteries are patent through the skull base and siphon regions. No siphon stenosis. On the right, the anterior and middle cerebral arteries are patent. No large or medium vessel occlusion. On the left, the anterior cerebral artery is patent and normal appearing. The left middle cerebral artery is occluded in the proximal M1 segment.  In April of this year, this vessel was stenotic but not occluded. Distal branch vessels do show some opacification presumably secondary to collateral supply. Posterior circulation: Both vertebral arteries are patent to the basilar. No basilar stenosis. Posterior circulation branch vessels are normal. Venous sinuses: Patent and normal. Anatomic variants: None significant. Review of the MIP images confirms the above findings CT Brain Perfusion Findings: ASPECTS: 8 CBF (<30%) Volume: 48mL Perfusion (Tmax>6.0s) volume: 69mL Mismatch Volume: 52mL Infarction Location:Late acute infarction in the left temporal and parietal region which is showing luxury  perfusion, with artifactual absence of diminished CBF. Cerebral blood volume increased in the region of infarction. Considering that, the region appears fairly well matched to the area prolonged T-max. IMPRESSION: 1. No carotid bifurcation disease on either side. 2. 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 In April this was stenotic but patent. Distal branch vessels show some opacification presumably secondary to collateral supply. 3. CT perfusion shows a 14 CC region of T-max greater than 6 seconds in the left temporal and parietal region which is showing luxury perfusion. This is consistent with completed infarction with luxury perfusion. Considering that, the region appears fairly well matched to the area of infarction and I doubt that there is a large region of continued at risk brain. 4. Both vertebral arteries are widely patent to the basilar. Left vertebral artery arises directly from the arch. Electronically Signed   By: Nelson Chimes M.D.   On: 10/11/2019 15:15   Ct Cerebral Perfusion W Contrast  Result Date: 10/11/2019 CLINICAL DATA:  Right-sided weakness. Unable to speak. EXAM: CT ANGIOGRAPHY HEAD AND NECK CT PERFUSION BRAIN TECHNIQUE: Multidetector CT imaging of the head and neck was performed using the standard protocol during bolus administration of intravenous contrast. Multiplanar CT image reconstructions and MIPs were obtained to evaluate the vascular anatomy. Carotid stenosis measurements (when applicable) are obtained utilizing NASCET criteria, using the distal internal carotid diameter as the denominator. Multiphase CT imaging of the brain was performed following IV bolus contrast injection. Subsequent parametric perfusion maps were calculated using RAPID software. CONTRAST:  132mL OMNIPAQUE IOHEXOL 350 MG/ML SOLN COMPARISON:  Head CT earlier same day FINDINGS: CTA NECK FINDINGS Aortic arch: Mild aortic atherosclerosis. No aneurysm or dissection. Left vertebral artery  arises directly from the arch. Right carotid system: Common carotid artery widely patent to the bifurcation. Carotid bifurcation is normal without soft or calcified plaque. Cervical ICA is mildly tortuous but widely patent. Left carotid system: Common carotid artery widely patent to the bifurcation. Carotid bifurcation is normal without soft or calcified plaque. Cervical ICA is tortuous but widely patent. Vertebral arteries: Both vertebral arteries are widely patent. Right vertebral artery origin is normal. Left vertebral artery arises from the arch as noted above. Skeleton: Normal Other neck: No mass or adenopathy. Upper chest: Normal Review of the MIP images confirms the above findings CTA HEAD FINDINGS Anterior circulation: Both internal carotid arteries are patent through the skull base and siphon regions. No siphon stenosis. On the right, the anterior and middle cerebral arteries are patent. No large or medium vessel occlusion. On the left, the anterior cerebral artery is patent and normal appearing. The left middle cerebral artery is occluded in the proximal M1 segment. In April of this year, this vessel was stenotic but not occluded. Distal branch vessels do show some opacification presumably secondary to collateral supply. Posterior circulation: Both vertebral arteries are patent to the basilar. No basilar stenosis. Posterior circulation branch vessels are normal. Venous  sinuses: Patent and normal. Anatomic variants: None significant. Review of the MIP images confirms the above findings CT Brain Perfusion Findings: ASPECTS: 8 CBF (<30%) Volume: 45mL Perfusion (Tmax>6.0s) volume: 41mL Mismatch Volume: 33mL Infarction Location:Late acute infarction in the left temporal and parietal region which is showing luxury perfusion, with artifactual absence of diminished CBF. Cerebral blood volume increased in the region of infarction. Considering that, the region appears fairly well matched to the area prolonged T-max.  IMPRESSION: 1. No carotid bifurcation disease on either side. 2. 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 In April this was stenotic but patent. Distal branch vessels show some opacification presumably secondary to collateral supply. 3. CT perfusion shows a 14 CC region of T-max greater than 6 seconds in the left temporal and parietal region which is showing luxury perfusion. This is consistent with completed infarction with luxury perfusion. Considering that, the region appears fairly well matched to the area of infarction and I doubt that there is a large region of continued at risk brain. 4. Both vertebral arteries are widely patent to the basilar. Left vertebral artery arises directly from the arch. Electronically Signed   By: Nelson Chimes M.D.   On: 10/11/2019 15:15   Ct Head Code Stroke Wo Contrast  Result Date: 10/11/2019 CLINICAL DATA:  Code stroke. Suspicion of cerebral hemorrhage. Specific symptoms not described. EXAM: CT HEAD WITHOUT CONTRAST TECHNIQUE: Contiguous axial images were obtained from the base of the skull through the vertex without intravenous contrast. COMPARISON:  MRI 03/28/2019.  CT 03/28/2019. FINDINGS: Brain: Old infarctions affect the left basal ganglia. There is acute infarction affecting the left posterior temporal and parietal cortical and subcortical brain consistent with MCA branch vessel territory infarction. This is probably about 32-day-old based on the low density. No sign of hemorrhage. No mass effect related to the infarction. 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 No obstructive hydrocephalus overall. No extra-axial fluid collection. Vascular: No abnormal vascular finding by CT. Skull: Negative Sinuses/Orbits: Clear except for mild mucosal thickening in the maxillary and  ethmoid sinuses. Orbits negative. Other: None ASPECTS ([place] Stroke Program Early CT Score) - Ganglionic level infarction (caudate, lentiform nuclei, internal capsule, insula, M1-M3 cortex): 6 - Supraganglionic infarction (M4-M6 cortex): 2 Total score (0-10 with 10 being normal): 8 IMPRESSION: 1. Areas of acute infarction in the left posterior temporal and parietal cortical and subcortical brain. Low-density suggesting that the stroke may be 71-day-old. No sign of hemorrhage or mass effect. Old infarctions in the left basal ganglia. 2. 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 Pituitary adenoma versus meningioma. 3. ASPECTS is 8 4. These results were communicated to Dr. Leonel Ramsay at 1:50 pmon 10/27/2020by text page via the [facility] messaging system. Electronically Signed   By: Nelson Chimes M.D.   On: 10/11/2019 13:54    Subjective: Patient seen and examined.  Spanish interpreter used.  Patient is less anxious today.  She was unable to speak, otherwise was able to nod head and follow commands.   Discharge Exam: Vitals:   10/13/19 0754 10/13/19 1327  BP: 132/82 138/80  Pulse: 73 74  Resp: 16 16  Temp: 97.6 F (36.4 C) 97.6 F (36.4 C)  SpO2: 99% 100%   Vitals:  10/13/19 0035 10/13/19 0500 10/13/19 0754 10/13/19 1327  BP: (!) 144/85 (!) 145/73 132/82 138/80  Pulse: 74 70 73 74  Resp: 18 20 16 16   Temp: (!) 97.5 F (36.4 C) 97.7 F (36.5 C) 97.6 F (36.4 C) 97.6 F (36.4 C)  TempSrc: Oral Oral Oral Oral  SpO2: 100% 100% 99% 100%  Weight:      Height:        General: Pt is alert, awake, not in acute distress Cardiovascular: RRR, S1/S2 +, no rubs, no gallops Respiratory: CTA bilaterally, no wheezing, no rhonchi Abdominal: Soft, NT, ND, bowel sounds + Extremities: no edema, no cyanosis Mild right facial droop.  Right upper extremity 4/5.  No other neurological deficits.    The results of  significant diagnostics from this hospitalization (including imaging, microbiology, ancillary and laboratory) are listed below for reference.     Microbiology: Recent Results (from the past 240 hour(s))  SARS CORONAVIRUS 2 (TAT 6-24 HRS) Nasopharyngeal Nasopharyngeal Swab     Status: None   Collection Time: 10/11/19  3:17 PM   Specimen: Nasopharyngeal Swab  Result Value Ref Range Status   SARS Coronavirus 2 NEGATIVE NEGATIVE Final    Comment: (NOTE) SARS-CoV-2 target nucleic acids are NOT DETECTED. The SARS-CoV-2 RNA is generally detectable in upper and lower respiratory specimens during the acute phase of infection. Negative results do not preclude SARS-CoV-2 infection, do not rule out co-infections with other pathogens, and should not be used as the sole basis for treatment or other patient management decisions. Negative results must be combined with clinical observations, patient history, and epidemiological information. The expected result is Negative. Fact Sheet for Patients: SugarRoll.be Fact Sheet for Healthcare Providers: https://www.woods-mathews.com/ This test is not yet approved or cleared by the [place] FDA and  has been authorized for detection and/or diagnosis of SARS-CoV-2 by FDA under an Emergency Use Authorization (EUA). This EUA will remain  in effect (meaning this test can be used) for the duration of the COVID-19 declaration under Section 56 4(b)(1) of the Act, 21 U.S.C. section 360bbb-3(b)(1), unless the authorization is terminated or revoked sooner. Performed at [facility], [place] [address], [place], [place] [address]      Labs: BNP (last 3 results) No results for input(s): BNP in the last 8760 hours. Basic Metabolic Panel: Recent Labs  Lab 10/11/19 1334 10/11/19 1337  NA 135 136  K 4.1 4.2  CL 105 106  CO2 18*  --   GLUCOSE 326* 319*  BUN 17 18  CREATININE 0.90 0.80  CALCIUM 9.4  --     Liver Function Tests: Recent Labs  Lab 10/11/19 1334  AST 20  ALT 18  ALKPHOS 71  BILITOT 0.5  PROT 7.2  ALBUMIN 4.0   No results for input(s): LIPASE, AMYLASE in the last 168 hours. No results for input(s): AMMONIA in the last 168 hours. CBC: Recent Labs  Lab 10/11/19 1334 10/11/19 1337  WBC 5.6  --   NEUTROABS 3.4  --   HGB 13.5 13.6  HCT 39.0 40.0  MCV 87.4  --   PLT 287  --    Cardiac Enzymes: No results for input(s): CKTOTAL, CKMB, CKMBINDEX, TROPONINI in the last 168 hours. BNP: Invalid input(s): POCBNP CBG: Recent Labs  Lab 10/13/19 0039 10/13/19 0511 10/13/19 0748 10/13/19 1137 10/13/19 1553  GLUCAP 96 119* 124* 184* 233*   D-Dimer No results for input(s): DDIMER in the last 72 hours. Hgb A1c Recent Labs  10/12/19 0329  HGBA1C 8.8*   Lipid Profile Recent Labs    10/12/19 0329  CHOL 228*  HDL 44  LDLCALC 125*  TRIG 293*  CHOLHDL 5.2   Thyroid function studies No results for input(s): TSH, T4TOTAL, T3FREE, THYROIDAB in the last 72 hours.  Invalid input(s): FREET3 Anemia work up No results for input(s): VITAMINB12, FOLATE, FERRITIN, TIBC, IRON, RETICCTPCT in the last 72 hours. Urinalysis    Component Value Date/Time   COLORURINE STRAW (A) 10/11/2019 1549   APPEARANCEUR CLEAR 10/11/2019 1549   LABSPEC 1.024 10/11/2019 1549   PHURINE 8.0 10/11/2019 1549   GLUCOSEU >=500 (A) 10/11/2019 1549   HGBUR NEGATIVE 10/11/2019 1549   BILIRUBINUR NEGATIVE 10/11/2019 1549   KETONESUR NEGATIVE 10/11/2019 1549   PROTEINUR NEGATIVE 10/11/2019 1549   NITRITE NEGATIVE 10/11/2019 1549   LEUKOCYTESUR NEGATIVE 10/11/2019 1549   Sepsis Labs Invalid input(s): PROCALCITONIN,  WBC,  LACTICIDVEN Microbiology Recent Results (from the past 240 hour(s))  SARS CORONAVIRUS 2 (TAT 6-24 HRS) Nasopharyngeal Nasopharyngeal Swab     Status: None   Collection Time: 10/11/19  3:17 PM   Specimen: Nasopharyngeal Swab  Result Value Ref Range Status   SARS  Coronavirus 2 NEGATIVE NEGATIVE Final    Comment: (NOTE) SARS-CoV-2 target nucleic acids are NOT DETECTED. The SARS-CoV-2 RNA is generally detectable in upper and lower respiratory specimens during the acute phase of infection. Negative results do not preclude SARS-CoV-2 infection, do not rule out co-infections with other pathogens, and should not be used as the sole basis for treatment or other patient management decisions. Negative results must be combined with clinical observations, patient history, and epidemiological information. The expected result is Negative. Fact Sheet for Patients: SugarRoll.be Fact Sheet for Healthcare Providers: https://www.woods-mathews.com/ This test is not yet approved or cleared by the [place] FDA and  has been authorized for detection and/or diagnosis of SARS-CoV-2 by FDA under an Emergency Use Authorization (EUA). This EUA will remain  in effect (meaning this test can be used) for the duration of the COVID-19 declaration under Section 56 4(b)(1) of the Act, 21 U.S.C. section 360bbb-3(b)(1), unless the authorization is terminated or revoked sooner. Performed at [facility], [place] [address], [place], [place] [address]      Time coordinating discharge:  35 minutes  SIGNED:   Barb Merino, MD  [facility] 10/13/2019, 4:53 PM

## 2019-10-13 NOTE — Progress Notes (Signed)
Physical Therapy Treatment Patient Details Name: Cheryl Massey MRN: [id number] DOB: [birth year] Today's Date: 10/13/2019    History of Present Illness Cheryl Massey is a 63 y.o. female with past medical history of stroke involving left temporal, occipital and parietal lobes.  Reorts 10 days ago she was having difficulty expressing herself which worsened over time.  On admission was noted to have right facial droop, decreased sensation on the right face, arm and leg and also weakness of the right arm and leg.  CT head revealed new Left MCA occlusion and stroke.    PT Comments    Patient progressing with balance, safety use of cane with ambulation this session.  Also more aware and engaged with in person Spanish interpreter and with cognition closer to baseline.  Still noting some R inattention with turning in hallway.  Feel she remains appropriate for HHPT with family support at d/c.   Follow Up Recommendations  Home health PT;Supervision/Assistance - 24 hour     Equipment Recommendations  Cane    Recommendations for Other Services       Precautions / Restrictions Precautions Precautions: Fall Precaution Comments: expressive aphasia, Spanish speaking    Mobility  Bed Mobility   Bed Mobility: Sit to Supine     Supine to sit: Supervision        Transfers Overall transfer level: Needs assistance Equipment used: Straight cane Transfers: Sit to/from Stand Sit to Stand: Min guard         General transfer comment: assist for balance/cane use  Ambulation/Gait Ambulation/Gait assistance: Supervision;Min guard Gait Distance (Feet): 300 Feet Assistive device: Straight cane Gait Pattern/deviations: Step-through pattern;Decreased stride length     General Gait Details: proper use of cane without cues, demonstrated R inattention when cued to turn R and pt continued forward min A for redirection   Stairs Stairs: Yes Stairs assistance:  Min guard;Supervision Stair Management: One rail Right;With cane;Step to pattern;Alternating pattern Number of Stairs: 3 General stair comments: self selected sequence with combination of step to and step through pattern, used rail and cane with cues   Wheelchair Mobility    Modified Rankin (Stroke Patients Only) Modified Rankin (Stroke Patients Only) Pre-Morbid Rankin Score: No significant disability Modified Rankin: Moderately severe disability     Balance Overall balance assessment: Needs assistance   Sitting balance-Leahy Scale: Good     Standing balance support: No upper extremity supported Standing balance-Leahy Scale: Good                              Cognition Arousal/Alertness: Awake/alert Behavior During Therapy: WFL for tasks assessed/performed Overall Cognitive Status: Impaired/Different from baseline Area of Impairment: Attention;Safety/judgement                   Current Attention Level: Selective     Safety/Judgement: Decreased awareness of deficits     General Comments: some R inattention noted this session      Exercises Other Exercises Other Exercises: Educated performed with in person interpreter about stroke warning signs.    General Comments General comments (skin integrity, edema, etc.): Graciella present for Spanish interpreter.  Patient more engaged and cognitively appropriate despite language challenges.  Toileted and washed hands during session with S      Pertinent Vitals/Pain Pain Assessment: No/denies pain    Home Living  Prior Function            PT Goals (current goals can now be found in the care plan section) Progress towards PT goals: Progressing toward goals    Frequency    Min 4X/week      PT Plan Current plan remains appropriate    Co-evaluation              AM-PAC PT "6 Clicks" Mobility   Outcome Measure  Help needed turning from your back to your  side while in a flat bed without using bedrails?: None Help needed moving from lying on your back to sitting on the side of a flat bed without using bedrails?: None Help needed moving to and from a bed to a chair (including a wheelchair)?: A Little Help needed standing up from a chair using your arms (e.g., wheelchair or bedside chair)?: A Little Help needed to walk in hospital room?: A Little Help needed climbing 3-5 steps with a railing? : A Little 6 Click Score: 20    End of Session Equipment Utilized During Treatment: Gait belt Activity Tolerance: Patient tolerated treatment well Patient left: in chair;with call bell/phone within reach;with chair alarm set   PT Visit Diagnosis: Other abnormalities of gait and mobility (R26.89);Hemiplegia and hemiparesis Hemiplegia - Right/Left: Right Hemiplegia - dominant/non-dominant: Dominant Hemiplegia - caused by: Cerebral infarction     Time: [id number] PT Time Calculation (min) (ACUTE ONLY): 31 min  Charges:  $Gait Training: 8-22 mins $Self Care/Home Management: [place] [phone] 10/13/2019    Reginia Naas 10/13/2019, 12:16 PM

## 2019-10-13 NOTE — Progress Notes (Signed)
Patient discharged home with home health PT/OT. Education and instructions provided to patient and patient daughter. IV removed. CCMD notified. Pt leaving unit via wheelchair

## 2019-10-13 NOTE — TOC Transition Note (Signed)
Transition of Care [facility]) - CM/SW Discharge Note   Patient Details  Name: Setayesh Alcide MRN: [id number] Date of Birth: 03/09/1956  [facility]) CM/SW Contact:  Pollie Friar, RN Phone Number: 10/13/2019, 5:18 PM   Clinical Narrative:    Pt discharging home with charity [facility] services through [place]. Butch Penny with [facility] accepted the referral. DME in the room for d/c.  CM provided [place] letter to assist with the cost of her medications. Pt encouraged to use [facility] pharmacy to assist with medications after the first 30 days.  Daughter to provide transport home.    Final next level of care: [place] Barriers to Discharge: Inadequate or no insurance, Barriers Unresolved (comment)   Patient Goals and CMS Choice        Discharge Placement                       Discharge Plan and Services                DME Arranged: 3-N-1, Cane         HH Arranged: PT, OT, Speech Therapy, Social Work [employer] Agency: [place] ([place]) Date [place]: 10/13/19   Representative spoke with at [place]: Donna--Charity services  Social Determinants of Health ([place]) Interventions     Readmission Risk Interventions No flowsheet data found.

## 2019-10-17 ENCOUNTER — Telehealth: Payer: Self-pay | Admitting: Adult Health

## 2019-10-17 NOTE — Telephone Encounter (Signed)
Approve.  Thank you.

## 2019-10-17 NOTE — Telephone Encounter (Signed)
Cheryl Massey from [facility] is calling requesting a Verbal Order for Speech Therapy Evaluation for expressive aphasia   CB# [phone]

## 2019-10-17 NOTE — Telephone Encounter (Signed)
LMVM for [facility] with [facility] that it was approved VO for ST for pt. She is to call back if questions.

## 2019-10-20 ENCOUNTER — Telehealth: Payer: Self-pay

## 2019-10-20 ENCOUNTER — Telehealth: Payer: Self-pay | Admitting: Pediatric Intensive Care

## 2019-10-20 NOTE — Telephone Encounter (Signed)
Message received from [place] Hussey,RN/CM requesting appointment for patient at [facility] instead of [facility].   Notified [place] that appointment cancelled at [facility] 11/03/2019 and rescheduled for [facility] 11/22/2019 @ 1030.

## 2019-10-20 NOTE — Telephone Encounter (Signed)
Call from [place] Bailiff, [place] case worker. Client's daughter Apolonio Schneiders has questions regarding any MCD application made for client in hospital. Also is asking re: source of free or reduced medications. Advised that client has follow up appointment at [facility] on 11/19. The client should have adequate medication to bridge client to that point. CN advised that she will contact [facility] regarding possible CFA referral and follow up clinic appointment as client lives in [phone] zip code.  Lisette Abu Rn BSN CNP [phone]

## 2019-11-02 ENCOUNTER — Other Ambulatory Visit: Payer: Self-pay | Admitting: Critical Care Medicine

## 2019-11-02 MED ORDER — LOSARTAN POTASSIUM 25 MG PO TABS: 25.0000 mg | ORAL_TABLET | Freq: Every day | ORAL | 0 refills | Status: DC

## 2019-11-02 MED ORDER — CLOPIDOGREL BISULFATE 75 MG PO TABS: 75.0000 mg | ORAL_TABLET | Freq: Every day | ORAL | 0 refills | Status: AC

## 2019-11-02 MED ORDER — ATORVASTATIN CALCIUM 40 MG PO TABS: 40.0000 mg | ORAL_TABLET | Freq: Every day | ORAL | 0 refills | Status: DC

## 2019-11-02 MED ORDER — GLIPIZIDE-METFORMIN HCL 5-500 MG PO TABS: 1.0000 | ORAL_TABLET | Freq: Two times a day (BID) | ORAL | 0 refills | Status: DC

## 2019-11-02 MED ORDER — TOPIRAMATE 25 MG PO TABS: 25.0000 mg | ORAL_TABLET | ORAL | 0 refills | Status: DC

## 2019-11-02 MED ORDER — ASPIRIN 81 MG PO TBEC: 81.0000 mg | DELAYED_RELEASE_TABLET | Freq: Every day | ORAL | 0 refills | Status: DC

## 2019-11-02 NOTE — Progress Notes (Signed)
Bridging med refills

## 2019-11-03 ENCOUNTER — Inpatient Hospital Stay (INDEPENDENT_AMBULATORY_CARE_PROVIDER_SITE_OTHER): Payer: Self-pay | Admitting: Primary Care

## 2019-11-08 ENCOUNTER — Telehealth: Payer: Self-pay | Admitting: Pediatric Intensive Care

## 2019-11-08 NOTE — Telephone Encounter (Signed)
Call to [place] Bailiff- client's case worker at [employer]. Charleston Ropes has been working with client's daughter regarding medical appointments and medication as client is having speech issues. Cn advised Daisy that client's medical appointment is at [facility] on 12/8. CN can deliver bridge medication to client's home this afternoon. Lisette Abu RN BSn CNP [phone]

## 2019-11-22 ENCOUNTER — Ambulatory Visit (INDEPENDENT_AMBULATORY_CARE_PROVIDER_SITE_OTHER): Payer: Self-pay | Admitting: Primary Care

## 2019-11-22 ENCOUNTER — Ambulatory Visit: Payer: Self-pay | Admitting: Nurse Practitioner

## 2019-11-22 ENCOUNTER — Other Ambulatory Visit: Payer: Self-pay

## 2019-11-22 DIAGNOSIS — Z7689 Persons encountering health services in other specified circumstances: Secondary | ICD-10-CM

## 2019-11-22 DIAGNOSIS — M1389 Other specified arthritis, multiple sites: Secondary | ICD-10-CM

## 2019-11-22 DIAGNOSIS — I69351 Hemiplegia and hemiparesis following cerebral infarction affecting right dominant side: Secondary | ICD-10-CM

## 2019-11-22 DIAGNOSIS — M2550 Pain in unspecified joint: Secondary | ICD-10-CM

## 2019-11-22 DIAGNOSIS — Z09 Encounter for follow-up examination after completed treatment for conditions other than malignant neoplasm: Secondary | ICD-10-CM

## 2019-11-22 DIAGNOSIS — E119 Type 2 diabetes mellitus without complications: Secondary | ICD-10-CM

## 2019-11-22 MED ORDER — IBUPROFEN 600 MG PO TABS: 600.0000 mg | ORAL_TABLET | Freq: Three times a day (TID) | ORAL | 2 refills | Status: DC | PRN

## 2019-11-22 MED ORDER — GLIPIZIDE 10 MG PO TABS: 10.0000 mg | ORAL_TABLET | Freq: Two times a day (BID) | ORAL | 3 refills | Status: DC

## 2019-11-22 MED ORDER — ATORVASTATIN CALCIUM 40 MG PO TABS: 40.0000 mg | ORAL_TABLET | Freq: Every day | ORAL | 5 refills | Status: DC

## 2019-11-22 MED ORDER — METFORMIN HCL 1000 MG PO TABS: 1000.0000 mg | ORAL_TABLET | Freq: Two times a day (BID) | ORAL | 3 refills | Status: DC

## 2019-11-22 NOTE — Progress Notes (Signed)
Virtual Visit via Telephone Note  I connected with [place] on 11/22/19 at  8:30 AM EST by telephone and verified that I am speaking with the correct person using two identifiers.   I discussed the limitations, risks, security and privacy concerns of performing an evaluation and management service by telephone and the availability of in person appointments. I also discussed with the patient that there may be a patient responsible charge related to this service. The patient expressed understanding and agreed to proceed.   History of Present Illness: [name] is having a tele visit to establish care recently followed neurology CVA. She has right sided weakness and has complaints of joint pain all over. Asked was she laying in the bed all day stated she has been resting but getting up sometimes. Encourage as much activity as she can tolerated. She did state she has arthritis.   Past Medical History:  Diagnosis Date  . Diabetes mellitus without complication ([place])   . Hypertension    Current Outpatient Medications on File Prior to Visit  Medication Sig Dispense Refill  . aspirin 81 MG EC tablet Take 1 tablet (81 mg total) by mouth daily. 30 tablet 0  . atorvastatin (LIPITOR) 40 MG tablet Take 1 tablet (40 mg total) by mouth daily at 6 PM. 30 tablet 0  . clopidogrel (PLAVIX) 75 MG tablet Take 1 tablet (75 mg total) by mouth daily. 30 tablet 0  . glipiZIDE-metformin (METAGLIP) 5-500 MG tablet Take 1 tablet by mouth 2 (two) times daily before a meal. 60 tablet 0  . losartan (COZAAR) 25 MG tablet Take 1 tablet (25 mg total) by mouth daily. 30 tablet 0  . topiramate (TOPAMAX) 25 MG tablet Take 1 tablet (25 mg total) by mouth every 3 (three) days. 30 tablet 0   No current facility-administered medications on file prior to visit.      Observations/Objective: Review of Systems  Musculoskeletal: Positive for myalgias.  Neurological: Positive for  weakness and headaches.       Right side    Assessment and Plan: Chona was seen today for new patient (initial visit).  Diagnoses and all orders for this visit:  Encounter to establish care Juluis Mire, NP-C will be your  (PCP) mastered prepared that is able to that will  diagnosed and treatment able to answer health concern as well as continuing care of varied medical conditions, not limited by cause, organ system, or diagnosis.   Hospital discharge follow-up Past medical history of stroke involving left temporal, occipital and parietal lobes. Recommendations for Outpatient Follow-up: Follow up with PCP in 1-2 weeks and Follow-up with neurology, office will schedule. Followed by neurology   Arthralgia, unspecified joint . May alternate with heat and ice application for pain relief. May also alternate with acetaminophen and Ibuprofen as prescribed pain relief. Other alternatives include massage, acupuncture and water aerobics.  You must stay active and avoid a sedentary lifestyle. Treat with NSAIDs  Type 2 diabetes mellitus treated without insulin (HCC) Uncontrolled A1C 8.8 in hospital on oral agents discussed  Goal for controlled 6.5. Foods that are high in carbohydrates are the following rice, potatoes, breads, sugars, and pastas.  Reduction in the intake (eating) will assist in lowering your blood sugars. Increased metformin to 100mg  twice daily and glipizide 10mg  twice daily   Other orders -     ibuprofen (ADVIL) 600 MG tablet; Take 1 tablet (600 mg total) by mouth every 8 (eight)  hours as needed for moderate pain.    Follow Up Instructions:   I discussed the assessment and treatment plan with the patient. The patient was provided an opportunity to ask questions and all were answered. The patient agreed with the plan and demonstrated an understanding of the instructions.   The patient was advised to call back or seek an in-person evaluation if the symptoms worsen or if the  condition fails to improve as anticipated.  I provided 30 minutes of non-face-to-face time during this encounter. To review previous encounters, labs and imaging   Kerin Perna, NP

## 2019-12-01 ENCOUNTER — Telehealth: Payer: Self-pay

## 2019-12-01 ENCOUNTER — Ambulatory Visit: Payer: Self-pay | Admitting: Adult Health

## 2019-12-01 NOTE — Progress Notes (Deleted)
[facility] [address] [place]. [place] [address] [phone]       [place] Date of Birth:  1956-06-07 Medical Record Number:  [id number]   Reason for Referral:  hospital stroke follow up    CHIEF COMPLAINT:  No chief complaint on file.   HPI: Stroke admission 03/28/2019: [name] is a 63 y.o. female with history of DB and HTN  who presented on 03/28/2019 with R arm and leg weakness.  Stroke work-up revealed left MCA cortical infarcts as evidenced on MRI in setting of left M1 intrinsic stenosis versus unknown embolic source.  MRI showed small areas of infarct within left temporal, occipital and parietal lobe as well as old left BG infarcts.  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  2D echo normal EF without cardiac source of embolus identified.  LDL 94.  A1c 8.8.  Recommended DAPT for 3 months then aspirin alone.  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  HTN stable.  Initiated atorvastatin 40 mg daily for HLD management.  Uncontrolled DM and recommend close PCP follow-up.  Other stroke risk factors include prior stroke on imaging.  She was discharged home in stable condition.  Initial visit 10/04/2019: [name] is being seen today for hospital follow-up accompanied by daughter and spanish interpreter.  Daughter provides majority of history with assistance of interpreter.  After hospital discharge, she was experiencing generalized headache and memory loss with improvement.  Memory loss and headaches have worsened over the past 2 weeks.  When requested daughter provide examples of memory loss and cognitive concerns, she states she has greater difficulty trying to get out what is needed to be said with word finding difficulty.  Headaches have been present over  left temporal and orbital area and have been present daily.  Denies visual changes, weakness, numbness/tingling or any other neurological changes.  Daughter also endorses decreased appetite with weight loss over the past few months.  It was recommended to follow-up with neurosurgery at hospital discharge but patient did not have this follow-up.  It was also recommended for her to follow-up with community health and wellness to establish care with PCP but this apparently was not done.  Unable to adequately determine exact medications patient is currently taking as initially endorse only being on diabetic medication (glipizide-Metformin) but then did state she was currently on aspirin 81 mg daily.  Per report, she is not currently taking atorvastatin and unable to determine if she was on Plavix for 50-month duration as recommended at discharge.  Difficulty with medication and appointment compliance due to lack of insurance and inability to pay for appointments, medications and testing.  Blood pressure today 133/82.  No further concerns at this time.  New stroke admission 10/12/2019: [name] is a 63 y.o. female with history of recent stroke. Now presenting with 10-day history of worsening expressive aphasia, right-sided weakness and decreased sensation.  Evaluated by Dr. Leonie Man and stroke team with stroke work-up showing left middle cerebral artery branch infarct as evidenced on CT head due to left middle cerebral artery occlusion from large vessel arthrosclerosis.  It does not appear as MRI brain obtained.  CTA head/neck showed prior M1 stenosis now occluded with distal collateral supply presumed.  CT perfusion 14 mm mismatch.  A CT perfusion showed good  perfusion of all viable areas, it was not felt that therapy any benefit to consider intervention.  Previously on aspirin and recommended aspirin and Plavix for 3 months and aspirin alone.  HTN stable and continued on Cozaar.  LDL 125 and  recommend continuation of atorvastatin 40 mg daily.  A1c 8.8 and recommended follow-up with PCP for uncontrolled DM.  Evaluated by therapies and recommended home health PT/OT.  She was also recently started on Topamax due to ongoing headaches and was advised to continue.  She was discharged home in stable condition.    Update 12/01/2019: Since recent admission, residual deficits ***.  She continues on aspirin and Plavix without bleeding or bruising.  Continues on atorvastatin without myalgias.  Blood pressure today ***.  She has recently established care with new PCP for ongoing management and monitoring.  She continues on Topamax with ongoing benefit of headaches.      ROS:   14 system review of systems performed and negative with exception of memory loss, confusion, speech difficulty, headaches  PMH:  Past Medical History:  Diagnosis Date  . Diabetes mellitus without complication ([place])   . Hypertension     PSH:  Past Surgical History:  Procedure Laterality Date  . ABDOMINAL HYSTERECTOMY    . APPENDECTOMY      Social History:  Social History   Socioeconomic History  . Marital status: Married    Spouse name: Not on file  . Number of children: Not on file  . Years of education: Not on file  . Highest education level: Not on file  Occupational History  . Not on file  Tobacco Use  . Smoking status: Never Smoker  . Smokeless tobacco: Never Used  Substance and Sexual Activity  . Alcohol use: No  . Drug use: No  . Sexual activity: Yes    Birth control/protection: Surgical  Other Topics Concern  . Not on file  Social History Narrative  . Not on file   Social Determinants of Health   Financial Resource Strain:   . Difficulty of Paying Living Expenses: Not on file  Food Insecurity:   . Worried About Charity fundraiser in the Last Year: Not on file  . Ran Out of Food in the Last Year: Not on file  Transportation Needs:   . Lack of Transportation (Medical): Not on file    . Lack of Transportation (Non-Medical): Not on file  Physical Activity:   . Days of Exercise per Week: Not on file  . Minutes of Exercise per Session: Not on file  Stress:   . Feeling of Stress : Not on file  Social Connections:   . Frequency of Communication with Friends and Family: Not on file  . Frequency of Social Gatherings with Friends and Family: Not on file  . Attends Religious Services: Not on file  . Active Member of Clubs or Organizations: Not on file  . Attends Archivist Meetings: Not on file  . Marital Status: Not on file  Intimate Partner Violence:   . Fear of Current or Ex-Partner: Not on file  . Emotionally Abused: Not on file  . Physically Abused: Not on file  . Sexually Abused: Not on file    Family History:  Family History  Problem Relation Age of Onset  . Hypertension Mother   . Hypertension Father     Medications:   Current Outpatient Medications on File Prior to Visit  Medication Sig Dispense Refill  . aspirin 81 MG  EC tablet Take 1 tablet (81 mg total) by mouth daily. 30 tablet 0  . atorvastatin (LIPITOR) 40 MG tablet Take 1 tablet (40 mg total) by mouth daily at 6 PM. 30 tablet 5  . clopidogrel (PLAVIX) 75 MG tablet Take 1 tablet (75 mg total) by mouth daily. 30 tablet 0  . glipiZIDE (GLUCOTROL) 10 MG tablet Take 1 tablet (10 mg total) by mouth 2 (two) times daily before a meal. 60 tablet 3  . ibuprofen (ADVIL) 600 MG tablet Take 1 tablet (600 mg total) by mouth every 8 (eight) hours as needed for moderate pain. 90 tablet 2  . losartan (COZAAR) 25 MG tablet Take 1 tablet (25 mg total) by mouth daily. 30 tablet 0  . metFORMIN (GLUCOPHAGE) 1000 MG tablet Take 1 tablet (1,000 mg total) by mouth 2 (two) times daily with a meal. 180 tablet 3  . topiramate (TOPAMAX) 25 MG tablet Take 1 tablet (25 mg total) by mouth every 3 (three) days. 30 tablet 0   No current facility-administered medications on file prior to visit.    Allergies:  No Known  Allergies   Physical Exam  There were no vitals filed for this visit. There is no height or weight on file to calculate BMI. No exam data present  Depression screen [facility] Hights 2/9 11/22/2019  Decreased Interest 0  Down, Depressed, Hopeless 0  PHQ - 2 Score 0     General: well developed, well nourished, pleasant middle-aged Hispanic female, seated, in no evident distress Head: head normocephalic and atraumatic.   Neck: supple with no carotid or supraclavicular bruits Cardiovascular: regular rate and rhythm, no murmurs Musculoskeletal: no deformity Skin:  no rash/petichiae Vascular:  Normal pulses all extremities   Neurologic Exam Mental Status: Awake and fully alert.   Spanish-speaking therefore difficulty assessing possible dysarthria or aphasia but none noted by interpreter.  Oriented to place and time. Recent and remote memory diminished.  Majority of history provided by daughter.  Mood and affect appropriate.  Cranial Nerves: Fundoscopic exam reveals sharp disc margins. Pupils equal, briskly reactive to light. Extraocular movements full without nystagmus. Visual fields full to confrontation. Hearing intact. Facial sensation intact. Face, tongue, palate moves normally and symmetrically.  Motor: Normal bulk and tone. Normal strength in all tested extremity muscles. Sensory.: intact to touch , pinprick , position and vibratory sensation.  Coordination: Rapid alternating movements normal in all extremities. Finger-to-nose and heel-to-shin performed accurately bilaterally. Gait and Station: Arises from chair without difficulty. Stance is normal. Gait demonstrates normal stride length and balance Reflexes: 1+ and symmetric. Toes downgoing.     NIHSS  0 Modified Rankin  2    Diagnostic Data (Labs, Imaging, Testing)  CT HEAD WO CONTRAST 10/11/2019 IMPRESSION: 1. Areas of acute infarction in the left posterior temporal and parietal cortical and subcortical brain. Low-density suggesting  that the stroke may be 44-day-old. No sign of hemorrhage or mass effect. Old infarctions in the left basal ganglia. 2. 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 Pituitary adenoma versus meningioma. 3. ASPECTS is 8  CT ANGIO HEAD W OR WO CONTRAST CT ANGIO NECK W OR WO CONTRAST CT CEREBRAL PERFUSION W CONTRAST IMPRESSION: 1. No carotid bifurcation disease on either side. 2. 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 In April this was stenotic but patent. Distal branch vessels show some opacification presumably secondary to collateral supply. 3. CT perfusion  shows a 14 CC region of T-max greater than 6 seconds in the left temporal and parietal region which is showing luxury perfusion. This is consistent with completed infarction with luxury perfusion. Considering that, the region appears fairly well matched to the area of infarction and I doubt that there is a large region of continued at risk brain. 4. Both vertebral arteries are widely patent to the basilar. Left vertebral artery arises directly from the arch.    CT HEAD WO CONTRAST 03/28/2019 IMPRESSION: 1. No acute finding by CT. 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 2. Sellar/suprasellar mass.  MRI recommended for further evaluation. 3. ASPECTS is 10, allowing that the left basal ganglia infarction is old. 4. These results were communicated to Dr. Leonel Ramsay at [facility] 4/13/2020by text page via the [facility] messaging system.  CT ANGIO HEAD W OR WO CONTRAST CT ANGIO NECK W OR WO CONTRAST 03/28/2019 IMPRESSION: 70% stenosis of the left M1 segment, extending over a length of 3-4 mm. I favor that this is a chronic stenosis rather than evidence of an embolus. No distal vessel occlusion is identified.  MR BRAIN WO  CONTRAST 03/28/2019 IMPRESSION: 1. Small acute cortical infarcts in the left temporal, occipital, and parietal lobes. 2. Chronic left basal ganglia infarct. 3. 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   ECHOCARDIOGRAM 03/28/2019 1. The left ventricle has normal systolic function with an ejection fraction of 60-65%. The cavity size was normal. There is mild concentric left ventricular hypertrophy. Left ventricular diastolic function could not be evaluated due to nondiagnostic images. 2. The right ventricle has normal systolic function. The cavity was normal. There is no increase in right ventricular wall thickness. Right ventricular systolic pressure could not be assessed. 3. The aortic valve is grossly normal. Aortic valve regurgitation was not assessed by color flow Doppler.   ASSESSMENT: Jaquilla Basilio Karle Massey is a 63 y.o. year old female presented with right-sided weakness on 03/28/2019 with stroke work-up revealing left MCA cortical infarcts in setting of left M1 intrinsic stenosis.  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  At prior visit on 10/04/2019, she complained of worsening memory, speech difficulty and headaches present over the past 2 weeks without worsening and MRI ordered but prior to obtaining MRI, she presented to ED on 10/11/2019 with worsening expressive aphasia, right-sided weakness and decreased sensation.  Stroke work-up revealed left middle cerebral artery branch infarct due to left middle cerebral artery occlusion from large vessel atherosclerosis.  Vascular risk factors include HTN, HLD, DM and prior stroke on imaging.      PLAN:  1. Left MCA stroke x2: Continue aspirin 81 mg daily and clopidogrel 75 mg daily  and atorvastatin for secondary stroke prevention. Maintain strict control of hypertension with blood pressure goal below 130/90,  diabetes with hemoglobin A1c goal below 6.5% and cholesterol with LDL cholesterol (bad cholesterol) goal below 70 mg/dL.  I also advised the patient to eat a healthy diet with plenty of whole grains, cereals, fruits and vegetables, exercise regularly with at least 30 minutes of continuous activity daily and maintain ideal body weight. 2. Left MCA stenosis: Was felt not to be candidate for intervention.  Continue DAPT for total of 40-month duration and then aspirin alone.  She also continue atorvastatin and discussed importance of secondary stroke risk factor management 3. Large suprasellar/sellar pituitary mass: Advised need to follow-up with neurosurgery  as recommended at discharge.  Phone number provided to daughter to ensure follow-up scheduled 4. HTN: Advised to continue current treatment regimen. 5. HLD: Discussion regarding importance of HLD management for secondary stroke prevention.  We will check lab work today but advised ongoing follow-up needed with PCP 6. DMII:  Advised to continue to monitor glucose levels at home and follow-up with newly established PCP for ongoing management and monitoring    Follow up in 2 months or call earlier if needed   Greater than 50% of time during this 45 minute visit was spent on counseling, explanation of diagnosis of prior left MCA stroke and recent new stroke in same area with left MCA occlusion, discussion regarding new onset of symptoms, indication for imaging, need for neurosurgery follow-up, reviewing risk factor management of intracranial stenosis, HTN, HLD and DM, planning of further management along with potential future management, and discussion with patient and family answering all questions.    Frann Rider, AGNP-BC  [facility] [address] [place], [place] [address]  Phone [phone] Fax [phone] Note: This document was prepared with digital dictation and possible smart phrase technology. Any  transcriptional errors that result from this process are unintentional.

## 2019-12-01 NOTE — Telephone Encounter (Signed)
Patient was a no call/no show for their appointment today.   

## 2019-12-05 ENCOUNTER — Encounter: Payer: Self-pay | Admitting: Adult Health

## 2020-09-18 ENCOUNTER — Telehealth: Payer: Self-pay | Admitting: Pediatric Intensive Care

## 2020-09-18 NOTE — Telephone Encounter (Signed)
Call to client's daughter via [employer]  [phone]. [id number]. Client referred by case management at [facility]. Client's mother was evaluated in [place] for hip replacement surgery. Client's daughter requests assistance to set up CFA for her mother. CN advised that her mother should establish primary care based on health history and need for assessment/referral. CN will assist with appointment scheduling. Will connect with client's daughter via phone next week. Lisette Abu Rn BSN CNP [phone]

## 2020-10-01 ENCOUNTER — Ambulatory Visit (INDEPENDENT_AMBULATORY_CARE_PROVIDER_SITE_OTHER): Payer: Self-pay | Admitting: Primary Care

## 2020-10-01 ENCOUNTER — Other Ambulatory Visit: Payer: Self-pay | Admitting: Primary Care

## 2020-10-01 ENCOUNTER — Other Ambulatory Visit: Payer: Self-pay

## 2020-10-01 ENCOUNTER — Encounter (INDEPENDENT_AMBULATORY_CARE_PROVIDER_SITE_OTHER): Payer: Self-pay | Admitting: Primary Care

## 2020-10-01 VITALS — BP 138/79 | HR 66 | Temp 97.2°F | Ht 63.0 in | Wt 153.2 lb

## 2020-10-01 DIAGNOSIS — M25552 Pain in left hip: Secondary | ICD-10-CM

## 2020-10-01 DIAGNOSIS — Z23 Encounter for immunization: Secondary | ICD-10-CM

## 2020-10-01 DIAGNOSIS — Z1211 Encounter for screening for malignant neoplasm of colon: Secondary | ICD-10-CM

## 2020-10-01 DIAGNOSIS — I10 Essential (primary) hypertension: Secondary | ICD-10-CM

## 2020-10-01 DIAGNOSIS — E119 Type 2 diabetes mellitus without complications: Secondary | ICD-10-CM

## 2020-10-01 LAB — GLUCOSE, POCT (MANUAL RESULT ENTRY): POC Glucose: 231 mg/dl — AB (ref 70–99)

## 2020-10-01 LAB — POCT GLYCOSYLATED HEMOGLOBIN (HGB A1C): Hemoglobin A1C: 9 % — AB (ref 4.0–5.6)

## 2020-10-01 MED ORDER — SITAGLIPTIN PHOSPHATE 100 MG PO TABS: 100.0000 mg | ORAL_TABLET | Freq: Every day | ORAL | 1 refills | Status: DC

## 2020-10-01 MED ORDER — LISINOPRIL 5 MG PO TABS: 5.0000 mg | ORAL_TABLET | Freq: Every day | ORAL | 3 refills | Status: DC

## 2020-10-01 MED ORDER — GLIPIZIDE 10 MG PO TABS: 10.0000 mg | ORAL_TABLET | Freq: Two times a day (BID) | ORAL | 1 refills | Status: DC

## 2020-10-01 MED ORDER — NAPROXEN 500 MG PO TABS: 500.0000 mg | ORAL_TABLET | Freq: Two times a day (BID) | ORAL | 0 refills | Status: DC

## 2020-10-01 MED ORDER — METFORMIN HCL 1000 MG PO TABS: 1000.0000 mg | ORAL_TABLET | Freq: Two times a day (BID) | ORAL | Status: DC

## 2020-10-01 NOTE — Progress Notes (Signed)
Established Patient Office Visit  Subjective:  Patient ID: Cheryl Massey [facility] Karle Plumber, female    DOB: 07/23/1956  Age: 64 y.o. MRN: [id number]  CC:  Chief Complaint  Patient presents with   Diabetes   Referral    to orthopedics    HPI [name] Karle Plumber is a 89-year-old Hispanic female interpreter used Florentina Jenny ID# [id number] who presents for management of diabetes denies polyphagia polyuria and polydipsia and hypertension- Denies shortness of breath, headaches, chest pain or lower extremity edema, sudden onset, vision changes, unilateral weakness, dizziness, paresthesias.  Requesting a referral to orthopedics  Past Medical History:  Diagnosis Date   Diabetes mellitus without complication ([place])    Hypertension     Past Surgical History:  Procedure Laterality Date   ABDOMINAL HYSTERECTOMY     APPENDECTOMY      Family History  Problem Relation Age of Onset   Hypertension Mother    Hypertension Father     Social History   Socioeconomic History   Marital status: Married    Spouse name: Not on file   Number of children: Not on file   Years of education: Not on file   Highest education level: Not on file  Occupational History   Not on file  Tobacco Use   Smoking status: Never Smoker   Smokeless tobacco: Never Used  Substance and Sexual Activity   Alcohol use: No   Drug use: No   Sexual activity: Yes    Birth control/protection: Surgical  Other Topics Concern   Not on file  Social History Narrative   Not on file   Social Determinants of Health   Financial Resource Strain:    Difficulty of Paying Living Expenses: Not on file  Food Insecurity:    Worried About [place] in the Last Year: Not on file   [employer] of Food in the Last Year: Not on file  Transportation Needs:    Lack of Transportation (Medical): Not on file   Lack of Transportation (Non-Medical): Not on file  Physical Activity:    Days of Exercise  per Week: Not on file   Minutes of Exercise per Session: Not on file  Stress:    Feeling of Stress : Not on file  Social Connections:    Frequency of Communication with Friends and Family: Not on file   Frequency of Social Gatherings with Friends and Family: Not on file   Attends Religious Services: Not on file   Active Member of Clubs or Organizations: Not on file   Attends Archivist Meetings: Not on file   Marital Status: Not on file  Intimate Partner Violence:    Fear of Current or Ex-Partner: Not on file   Emotionally Abused: Not on file   Physically Abused: Not on file   Sexually Abused: Not on file    Outpatient Medications Prior to Visit  Medication Sig Dispense Refill   aspirin 81 MG EC tablet Take 1 tablet (81 mg total) by mouth daily. 30 tablet 0   ibuprofen (ADVIL) 600 MG tablet Take 1 tablet (600 mg total) by mouth every 8 (eight) hours as needed for moderate pain. 90 tablet 2   metFORMIN (GLUCOPHAGE) 1000 MG tablet Take 1 tablet (1,000 mg total) by mouth 2 (two) times daily with a meal. 180 tablet 3   atorvastatin (LIPITOR) 40 MG tablet Take 1 tablet (40 mg total) by mouth daily at 6 PM. 30 tablet 5  topiramate (TOPAMAX) 25 MG tablet Take 1 tablet (25 mg total) by mouth every 3 (three) days. 30 tablet 0   glipiZIDE (GLUCOTROL) 10 MG tablet Take 1 tablet (10 mg total) by mouth 2 (two) times daily before a meal. 60 tablet 3   losartan (COZAAR) 25 MG tablet Take 1 tablet (25 mg total) by mouth daily. 30 tablet 0   No facility-administered medications prior to visit.    No Known Allergies  ROS Review of Systems  Musculoskeletal:       Left knee pain  Difficulty walking   All other systems reviewed and are negative.     Objective:    Physical Exam Vitals reviewed.  Constitutional:      Appearance: Normal appearance.  HENT:     Head: Normocephalic.     Right Ear: Tympanic membrane normal.     Left Ear: Tympanic membrane normal.      Nose: Nose normal.  Eyes:     Extraocular Movements: Extraocular movements intact.     Pupils: Pupils are equal, round, and reactive to light.  Cardiovascular:     Rate and Rhythm: Normal rate and regular rhythm.  Pulmonary:     Effort: Pulmonary effort is normal.     Breath sounds: Normal breath sounds.  Abdominal:     General: Bowel sounds are normal.  Musculoskeletal:        General: Normal range of motion.     Cervical back: Normal range of motion.  Skin:    General: Skin is warm and dry.  Neurological:     Mental Status: She is oriented to person, place, and time.  Psychiatric:        Mood and Affect: Mood normal.        Behavior: Behavior normal.        Thought Content: Thought content normal.        Judgment: Judgment normal.     BP 138/79 (BP Location: Right Arm, Patient Position: Sitting, Cuff Size: Normal)    Pulse 66    Temp (!) 97.2 F (36.2 C) (Temporal)    Ht 5' 3"  (1.6 m)    Wt 153 lb 3.2 oz (69.5 kg)    SpO2 99%    BMI 27.14 kg/m  Wt Readings from Last 3 Encounters:  10/01/20 153 lb 3.2 oz (69.5 kg)  10/11/19 158 lb 11.7 oz (72 kg)  10/04/19 149 lb 9.6 oz (67.9 kg)     Health Maintenance Due  Topic Date Due   Hepatitis C Screening  Never done   PNEUMOCOCCAL POLYSACCHARIDE VACCINE AGE 6-64 HIGH RISK  Never done   OPHTHALMOLOGY EXAM  Never done   TETANUS/TDAP  Never done   PAP SMEAR-Modifier  Never done   MAMMOGRAM  Never done   COLONOSCOPY  Never done    There are no preventive care reminders to display for this patient.  Lab Results  Component Value Date   TSH 1.136 03/29/2019   Lab Results  Component Value Date   WBC 5.6 10/11/2019   HGB 13.6 10/11/2019   HCT 40.0 10/11/2019   MCV 87.4 10/11/2019   PLT 287 10/11/2019   Lab Results  Component Value Date   NA 136 10/11/2019   K 4.2 10/11/2019   CO2 18 (L) 10/11/2019   GLUCOSE 319 (H) 10/11/2019   BUN 18 10/11/2019   CREATININE 0.80 10/11/2019   BILITOT 0.5 10/11/2019    ALKPHOS 71 10/11/2019   AST 20 10/11/2019   ALT  18 10/11/2019   PROT 7.2 10/11/2019   ALBUMIN 4.0 10/11/2019   CALCIUM 9.4 10/11/2019   ANIONGAP 12 10/11/2019   Lab Results  Component Value Date   CHOL 228 (H) 10/12/2019   Lab Results  Component Value Date   HDL 44 10/12/2019   Lab Results  Component Value Date   LDLCALC 125 (H) 10/12/2019   Lab Results  Component Value Date   TRIG 293 (H) 10/12/2019   Lab Results  Component Value Date   CHOLHDL 5.2 10/12/2019   Lab Results  Component Value Date   HGBA1C 9.0 (A) 10/01/2020      Assessment & Plan:  Sameena was seen today for diabetes and referral.  Diagnoses and all orders for this visit:  Type 2 diabetes mellitus treated without insulin ([place] Uncontrolled discussed insulin patient did not want to start that. Agreed to add  januvia 133m daily cont metformin 10085mand glucotrol 101mwice daily  -     HgB A1c 9.0  -     Glucose (CBG) -     Microalbumin/Creatinine Ratio, Urine -     Lipid panel; Future -     CBC with Differential/Platelet; Future  Essential hypertension -     lisinopril (ZESTRIL) 5 MG tablet; Take 1 tablet (5 mg total) by mouth daily. -     CMP14+EGFR; Future  Left hip pain -     Ambulatory referral to Orthopedic Surgery -     naproxen (NAPROSYN) 500 MG tablet; Take 1 tablet (500 mg total) by mouth 2 (two) times daily with a meal.  Comprehensive diabetic foot examination, type 2 DM, [facility]  Colon cancer screening FOBT- future   Other orders -     metFORMIN (GLUCOPHAGE) 1000 MG tablet; Take 1 tablet (1,000 mg total) by mouth 2 (two) times daily with a meal. -     glipiZIDE (GLUCOTROL) 10 MG tablet; Take 1 tablet (10 mg total) by mouth 2 (two) times daily before a meal. -     sitaGLIPtin (JANUVIA) 100 MG tablet; Take 1 tablet (100 mg total) by mouth daily.   Meds ordered this encounter  Medications   DISCONTD: metFORMIN (GLUCOPHAGE) 1000 MG tablet    Sig: Take 1  tablet (1,000 mg total) by mouth 2 (two) times daily with a meal.    Dispense:  180 tablet    Refill:  `1   glipiZIDE (GLUCOTROL) 10 MG tablet    Sig: Take 1 tablet (10 mg total) by mouth 2 (two) times daily before a meal.    Dispense:  180 tablet    Refill:  1   sitaGLIPtin (JANUVIA) 100 MG tablet    Sig: Take 1 tablet (100 mg total) by mouth daily.    Dispense:  90 tablet    Refill:  1   lisinopril (ZESTRIL) 5 MG tablet    Sig: Take 1 tablet (5 mg total) by mouth daily.    Dispense:  90 tablet    Refill:  3   naproxen (NAPROSYN) 500 MG tablet    Sig: Take 1 tablet (500 mg total) by mouth 2 (two) times daily with a meal.    Dispense:  30 tablet    Refill:  0   DISCONTD: metFORMIN (GLUCOPHAGE) 1000 MG tablet    Sig: Take 1 tablet (1,000 mg total) by mouth 2 (two) times daily with a meal.    Dispense:  180 tablet    Refill:  `1   metFORMIN (  GLUCOPHAGE) 1000 MG tablet    Sig: Take 1 tablet (1,000 mg total) by mouth 2 (two) times daily with a meal.    Dispense:  180 tablet    Refill:  `1    Follow-up: Return in about 3 months (around 01/01/2021) for DM/HTN.    Kerin Perna, NP

## 2020-10-01 NOTE — Patient Instructions (Signed)
Gripe en los adultos Influenza, Adult A la gripe tambin se la conoce como "influenza". Es una [employer], la nariz y la garganta (vas respiratorias). La causa un virus. La gripe provoca sntomas que son similares a los de un resfro. Tambin causa fiebre alta y dolores corporales. Se transmite fcilmente de persona a persona (es contagiosa). La mejor manera de prevenir la gripe es aplicndose la vacuna contra la gripe todos los aos. Cules son las causas? La causa de esta afeccin es el virus de la influenza. Puede contraer el virus de las siguientes maneras:  [place] gotitas que estn en el aire y que provienen de la tos o el estornudo de una persona que tiene el virus.  Tocar algo que tiene el virus (est contaminado) y luego tocarse la boca, la nariz o los ojos. Qu incrementa el riesgo? Hay ciertas cosas que lo pueden hacer ms propenso a Nurse, adult. Estas incluyen lo siguiente:  No lavarse las manos con frecuencia.  Tener contacto cercano con [employer] durante la temporada de resfro y gripe.  Tocarse la boca, los ojos o la nariz sin antes lavarse las manos.  No recibir la [employer]. Puede correr un mayor riesgo de tener gripe, junto con problemas graves como una infeccin pulmonar (neumona), si:  Es mayor de 61 aos de edad.  Est embarazada.  Tiene debilitado el sistema que combate las defensas (sistema inmunitario) debido a una enfermedad o porque toma determinados medicamentos.  Tiene una enfermedad prolongada (crnica), por ejemplo: ? Enfermedad cardaca, renal o pulmonar. ? Diabetes. ? Asma.  Tiene un trastorno heptico.  Tiene mucho sobrepeso (obesidad [place]).  Tiene anemia. Esta es una afeccin que afecta a los glbulos rojos. Cules son los signos o los sntomas? Los sntomas normalmente comienzan de repente y [place] [address] y [address] Pueden incluir los siguientes:  Cristy Hilts y [place].  Dolores de  [place], dolores en el cuerpo o dolores musculares.  Dolor de Investment banker, operational.  Tos.  Secrecin o congestin nasal.  Immunologist.  No desear comer en las cantidades normales (prdida del apetito).  Debilidad o cansancio (fatiga).  Mareos.  Malestar estomacal (nuseas) o ganas de devolver (vmitos). Cmo se trata? Si la gripe se encuentra de forma temprana, se la puede tratar con medicamentos que pueden ayudar a reducir la gravedad de la enfermedad y reducir su duracin (medicamentos antivirales). Estos pueden administrarse por boca (va oral) o por va (catter) intravenosa. Cuidarse en su hogar puede ayudar a que mejoren los sntomas. El mdico puede sugerirle lo siguiente:  Tomar medicamentos de Radio broadcast assistant.  Beber mucho lquido. La gripe suele desaparecer sola. Si tiene sntomas muy graves u otros problemas, puede recibir tratamiento en un hospital. Siga estas indicaciones en su casa:     Actividad  Descanse todo lo que sea necesario. Duerma lo suficiente.  Foy [place] en su casa y no concurra al [place] o a la escuela, como se lo haya indicado el mdico. ? No salga de su casa hasta que no haya tenido fiebre por 24horas sin tomar medicamentos. ? Salga de su casa solo para ir al [employer]. Comida y bebida  Wyatt Haste SRO (solucin de rehidratacin oral). Es Ardelia Mems bebida que se vende en farmacias y tiendas.  Beba suficiente lquido para Contractor pis (la orina) de color amarillo plido.  En la medida en que pueda, beba lquidos claros en pequeas cantidades. Los lquidos transparentes son, por ejemplo: ? Grayce Sessions. ? Trocitos  de hielo. ? Jugo de frutas con agua agregada (jugo de frutas diluido). ? Bebidas deportivas de bajas caloras.  En la medida en que pueda, consuma alimentos blandos y fciles de digerir en pequeas cantidades. Estos alimentos incluyen: ? Bananas. ? Pur de [employer]. ? Arroz. ? [employer]. ? Tostadas. ? Galletas.  No coma ni beba lo  siguiente: ? Lquidos con alto contenido de azcar o cafena. ? Alcohol. ? Alimentos condimentados o con alto contenido de [place]. Indicaciones generales  [employer] de venta libre y los recetados solamente como se lo haya indicado el mdico.  Use un humidificador de aire fro para que el aire de su casa est ms hmedo. Esto puede facilitar la respiracin.  Al toser o estornudar, cbrase la boca y la [place].  Lvese las manos con agua y jabn frecuentemente, en especial despus de toser o Brewing technologist. Use desinfectante para manos con alcohol si no dispone de [place].  Concurra a todas las visitas de control como se lo haya indicado el mdico. Esto es importante. Cmo se evita?   Colquese la vacuna antigripal todos los [place]. Puede colocarse la vacuna contra la gripe a fines de verano, en otoo o en invierno. Pregntele al mdico cundo debe aplicarse la vacuna contra la gripe.  Evite el contacto con personas que estn enfermas durante el otoo y el invierno (la temporada de resfro y gripe). Comunquese con un mdico si:  Tiene sntomas nuevos.  Tiene los siguientes sntomas: ? Tourist information centre manager. ? Materia fecal lquida (diarrea). ? Fiebre.  La tos empeora.  Empieza a tener ms mucosidad.  Tiene Higher education careers adviser.  Vomita. Solicite ayuda inmediatamente si:  Le falta el aire.  Tiene dificultad para respirar.  La piel o las uas se ponen de un color azulado.  Presenta dolor muy intenso o rigidez en el cuello.  Tiene dolor de cabeza repentino.  Le duele la cara o el odo de forma repentina.  No puede comer ni beber sin vomitar. Resumen  La gripe es una infeccin en los pulmones, la nariz y Patent examiner. La causa un virus.  Tome los medicamentos de venta libre y los recetados solamente como se lo haya indicado el mdico.  Aplicarse la vacuna contra la gripe todos los aos es la mejor manera de evitar contagiarse la gripe. Esta informacin no tiene  Marine scientist el consejo del mdico. Asegrese de hacerle al mdico cualquier pregunta que tenga. Document Revised: 07/14/2018 Document Reviewed: 07/14/2018 Elsevier Patient Education  [place].

## 2020-10-03 ENCOUNTER — Other Ambulatory Visit (INDEPENDENT_AMBULATORY_CARE_PROVIDER_SITE_OTHER): Payer: Self-pay

## 2020-10-03 ENCOUNTER — Other Ambulatory Visit: Payer: Self-pay

## 2020-10-03 DIAGNOSIS — I10 Essential (primary) hypertension: Secondary | ICD-10-CM

## 2020-10-03 DIAGNOSIS — E119 Type 2 diabetes mellitus without complications: Secondary | ICD-10-CM

## 2020-10-04 ENCOUNTER — Other Ambulatory Visit (INDEPENDENT_AMBULATORY_CARE_PROVIDER_SITE_OTHER): Payer: Self-pay | Admitting: Primary Care

## 2020-10-04 ENCOUNTER — Telehealth (INDEPENDENT_AMBULATORY_CARE_PROVIDER_SITE_OTHER): Payer: Self-pay | Admitting: Primary Care

## 2020-10-04 ENCOUNTER — Telehealth (INDEPENDENT_AMBULATORY_CARE_PROVIDER_SITE_OTHER): Payer: Self-pay

## 2020-10-04 DIAGNOSIS — E782 Mixed hyperlipidemia: Secondary | ICD-10-CM

## 2020-10-04 LAB — CMP14+EGFR
ALT: 16 IU/L (ref 0–32)
AST: 15 IU/L (ref 0–40)
Albumin/Globulin Ratio: 2 (ref 1.2–2.2)
Albumin: 4.3 g/dL (ref 3.8–4.8)
Alkaline Phosphatase: 103 IU/L (ref 44–121)
BUN/Creatinine Ratio: 19 (ref 12–28)
BUN: 15 mg/dL (ref 8–27)
Bilirubin Total: <0.2 mg/dL (ref 0.0–1.2)
CO2: 25 mmol/L (ref 20–29)
Calcium: 9.5 mg/dL (ref 8.7–10.3)
Chloride: 104 mmol/L (ref 96–106)
Creatinine, Ser: 0.8 mg/dL (ref 0.57–1.00)
GFR calc Af Amer: 91 mL/min/{1.73_m2} (ref >59)
GFR calc non Af Amer: 79 mL/min/{1.73_m2} (ref >59)
Globulin, Total: 2.2 g/dL (ref 1.5–4.5)
Glucose: 161 mg/dL — ABNORMAL HIGH (ref 65–99)
Potassium: 4.8 mmol/L (ref 3.5–5.2)
Sodium: 141 mmol/L (ref 134–144)
Total Protein: 6.5 g/dL (ref 6.0–8.5)

## 2020-10-04 LAB — CBC WITH DIFFERENTIAL/PLATELET
Basophils Absolute: 0.1 10*3/uL (ref 0.0–0.2)
Basos: 1 %
EOS (ABSOLUTE): 0.2 10*3/uL (ref 0.0–0.4)
Eos: 6 %
Hematocrit: 36.5 % (ref 34.0–46.6)
Hemoglobin: 12.5 g/dL (ref 11.1–15.9)
Immature Grans (Abs): 0 10*3/uL (ref 0.0–0.1)
Immature Granulocytes: 1 %
Lymphocytes Absolute: 1.3 10*3/uL (ref 0.7–3.1)
Lymphs: 35 %
MCH: 29.7 pg (ref 26.6–33.0)
MCHC: 34.2 g/dL (ref 31.5–35.7)
MCV: 87 fL (ref 79–97)
Monocytes Absolute: 0.5 10*3/uL (ref 0.1–0.9)
Monocytes: 14 %
Neutrophils Absolute: 1.6 10*3/uL (ref 1.4–7.0)
Neutrophils: 43 %
Platelets: 267 10*3/uL (ref 150–450)
RBC: 4.21 x10E6/uL (ref 3.77–5.28)
RDW: 13.1 % (ref 11.7–15.4)
WBC: 3.7 10*3/uL (ref 3.4–10.8)

## 2020-10-04 LAB — LIPID PANEL
Chol/HDL Ratio: 3.8 ratio (ref 0.0–4.4)
Cholesterol, Total: 166 mg/dL (ref 100–199)
HDL: 44 mg/dL (ref >39)
LDL Chol Calc (NIH): 85 mg/dL (ref 0–99)
Triglycerides: 221 mg/dL — ABNORMAL HIGH (ref 0–149)
VLDL Cholesterol Cal: 37 mg/dL (ref 5–40)

## 2020-10-04 LAB — MICROALBUMIN, URINE: Microalbumin, Urine: 4.8 ug/mL

## 2020-10-04 MED ORDER — ATORVASTATIN CALCIUM 20 MG PO TABS: 20.0000 mg | ORAL_TABLET | Freq: Every day | ORAL | 1 refills | Status: DC

## 2020-10-04 NOTE — Telephone Encounter (Signed)
Call placed using pacific interpreter Haylee [phone]) left message asking patient or daughter to return call to [facility] at [phone]. Nat Christen, CMA

## 2020-10-05 ENCOUNTER — Ambulatory Visit: Payer: Self-pay | Admitting: Orthopaedic Surgery

## 2020-10-05 ENCOUNTER — Ambulatory Visit (INDEPENDENT_AMBULATORY_CARE_PROVIDER_SITE_OTHER): Payer: Self-pay

## 2020-10-05 ENCOUNTER — Encounter: Payer: Self-pay | Admitting: Orthopaedic Surgery

## 2020-10-05 ENCOUNTER — Other Ambulatory Visit: Payer: Self-pay

## 2020-10-05 VITALS — Ht 64.0 in | Wt 153.8 lb

## 2020-10-05 DIAGNOSIS — M79605 Pain in left leg: Secondary | ICD-10-CM

## 2020-10-05 DIAGNOSIS — M1612 Unilateral primary osteoarthritis, left hip: Secondary | ICD-10-CM | POA: Insufficient documentation

## 2020-10-05 NOTE — Progress Notes (Signed)
Office Visit Note   Patient: Cheryl Massey           Date of Birth: [birth year]           MRN: [id number] Visit Date: 10/05/2020              Requested by: Kerin Perna, NP [address] [place],  [place] [address] PCP: Kerin Perna, NP   Assessment & Plan: Visit Diagnoses:  1. Primary osteoarthritis of left hip   2. Pain in left leg     Plan: Impression is end-stage advanced left hip DJD.  She has large subchondral sclerosis and cysts as well as remodeling of the femoral head.  These findings were reviewed with the patient and her family member through an interpreter.  We talked about treatment options including total hip replacement versus nonsurgical management which would likely be temporary and unsuccessful long-term.  Based on our discussion she would like to proceed with a left total hip replacement soon as possible.  Her most recent A1c from a few days ago was 9.0 and her repeat A1c is scheduled for about 3 months from now.  She understands that it has to be less than 7.8 before we can move forward with surgery.  Questions encouraged and answered.  Today's encounter was made more complex due to language barrier.  Follow-Up Instructions: Return if symptoms worsen or fail to improve.   Orders:  Orders Placed This Encounter  Procedures  . XR HIP UNILAT W OR W/O PELVIS 2-3 VIEWS LEFT  . XR Lumbar Spine 2-3 Views   No orders of the defined types were placed in this encounter.     Procedures: No procedures performed   Clinical Data: No additional findings.   Subjective: Chief Complaint  Patient presents with  . Left Leg - Pain  . Left Hip - Pain    Cheryl Massey is a 64 year old Hispanic female comes in for evaluation of chronic left hip pain that radiates into the buttock and down the thigh.  She denies low back pain or numbness and tingling and has gotten severely worse in the last 5 months.  She has been told in the past that she needed a  hip replacement in [place].  She is status post right total hip replacement back in 2005 and then required revision when she moved to [place] for either an infection or allergy.  She has a constant the left groin pain without relief with taking naproxen.  She is diabetic.   Review of Systems  Constitutional: Negative.   HENT: Negative.   Eyes: Negative.   Respiratory: Negative.   Cardiovascular: Negative.   Endocrine: Negative.   Musculoskeletal: Negative.   Neurological: Negative.   Hematological: Negative.   Psychiatric/Behavioral: Negative.   All other systems reviewed and are negative.    Objective: Vital Signs: Ht 5\' 4"  (1.626 m)   Wt 153 lb 12.8 oz (69.8 kg)   BMI 26.40 kg/m   Physical Exam Vitals and nursing note reviewed.  Constitutional:      Appearance: She is well-developed.  HENT:     Head: Normocephalic and atraumatic.  Pulmonary:     Effort: Pulmonary effort is normal.  Abdominal:     Palpations: Abdomen is soft.  Musculoskeletal:     Cervical back: Neck supple.  Skin:    General: Skin is warm.     Capillary Refill: Capillary refill takes less than 2 seconds.  Neurological:     Mental  Status: She is alert and oriented to person, place, and time.  Psychiatric:        Behavior: Behavior normal.        Thought Content: Thought content normal.        Judgment: Judgment normal.     Ortho Exam Left hip shows severe pain with any logroll or attempted range of motion.  She is very weak with straight leg raise.  Positive Stinchfield sign.  Negative sciatic tension signs.  Lateral hip is nontender.  Specialty Comments:  No specialty comments available.  Imaging: XR HIP UNILAT W OR W/O PELVIS 2-3 VIEWS LEFT  Result Date: 10/05/2020 Complete destruction of joint space with remodeling of the femoral head.  Advanced degenerative joint disease.  XR Lumbar Spine 2-3 Views  Result Date: 10/05/2020 No acute or structural abnormalities.    PMFS  History: Patient Active Problem List   Diagnosis Date Noted  . Primary osteoarthritis of left hip 10/05/2020  . CVA (cerebral vascular accident) ([place]) 10/11/2019  . CVA (cerebrovascular accident) ([place]) 10/11/2019  . TIA (transient ischemic attack) 03/28/2019  . Essential hypertension 03/28/2019  . Type 2 diabetes mellitus treated without insulin ([place]) 03/28/2019   Past Medical History:  Diagnosis Date  . Diabetes mellitus without complication ([place])   . Hypertension     Family History  Problem Relation Age of Onset  . Hypertension Mother   . Hypertension Father     Past Surgical History:  Procedure Laterality Date  . ABDOMINAL HYSTERECTOMY    . APPENDECTOMY     Social History   Occupational History  . Not on file  Tobacco Use  . Smoking status: Never Smoker  . Smokeless tobacco: Never Used  Substance and Sexual Activity  . Alcohol use: No  . Drug use: No  . Sexual activity: Yes    Birth control/protection: Surgical

## 2020-10-10 ENCOUNTER — Other Ambulatory Visit: Payer: Self-pay | Admitting: Primary Care

## 2020-10-10 DIAGNOSIS — Z1231 Encounter for screening mammogram for malignant neoplasm of breast: Secondary | ICD-10-CM

## 2020-10-12 ENCOUNTER — Other Ambulatory Visit: Payer: Self-pay

## 2020-10-12 ENCOUNTER — Ambulatory Visit: Payer: Self-pay | Attending: Primary Care

## 2020-10-16 ENCOUNTER — Other Ambulatory Visit: Payer: Self-pay

## 2020-10-16 ENCOUNTER — Ambulatory Visit
Admission: RE | Admit: 2020-10-16 | Discharge: 2020-10-16 | Disposition: A | Discharge status: Home / Self Care | Payer: No Typology Code available for payment source | Source: Ambulatory Visit | Attending: Primary Care | Admitting: Primary Care

## 2020-10-16 DIAGNOSIS — Z1231 Encounter for screening mammogram for malignant neoplasm of breast: Secondary | ICD-10-CM

## 2020-11-06 ENCOUNTER — Ambulatory Visit (INDEPENDENT_AMBULATORY_CARE_PROVIDER_SITE_OTHER): Payer: No Typology Code available for payment source | Admitting: Primary Care

## 2020-12-26 ENCOUNTER — Ambulatory Visit: Payer: No Typology Code available for payment source | Attending: Primary Care

## 2020-12-26 ENCOUNTER — Other Ambulatory Visit: Payer: Self-pay

## 2021-01-02 ENCOUNTER — Encounter (INDEPENDENT_AMBULATORY_CARE_PROVIDER_SITE_OTHER): Payer: Self-pay | Admitting: Primary Care

## 2021-01-02 ENCOUNTER — Other Ambulatory Visit: Payer: Self-pay | Admitting: Primary Care

## 2021-01-02 ENCOUNTER — Telehealth (INDEPENDENT_AMBULATORY_CARE_PROVIDER_SITE_OTHER): Payer: No Typology Code available for payment source | Admitting: Primary Care

## 2021-01-02 ENCOUNTER — Other Ambulatory Visit: Payer: Self-pay

## 2021-01-02 DIAGNOSIS — I10 Essential (primary) hypertension: Secondary | ICD-10-CM

## 2021-01-02 DIAGNOSIS — E782 Mixed hyperlipidemia: Secondary | ICD-10-CM

## 2021-01-02 DIAGNOSIS — E119 Type 2 diabetes mellitus without complications: Secondary | ICD-10-CM

## 2021-01-02 DIAGNOSIS — Z760 Encounter for issue of repeat prescription: Secondary | ICD-10-CM

## 2021-01-02 MED ORDER — SITAGLIPTIN PHOSPHATE 100 MG PO TABS: 100.0000 mg | ORAL_TABLET | Freq: Every day | ORAL | 1 refills | Status: DC

## 2021-01-02 MED ORDER — ATORVASTATIN CALCIUM 20 MG PO TABS: 20.0000 mg | ORAL_TABLET | Freq: Every day | ORAL | 1 refills | Status: DC

## 2021-01-02 MED ORDER — METFORMIN HCL 1000 MG PO TABS: 1000.0000 mg | ORAL_TABLET | Freq: Two times a day (BID) | ORAL | Status: DC

## 2021-01-02 MED ORDER — LISINOPRIL 5 MG PO TABS: 5.0000 mg | ORAL_TABLET | Freq: Every day | ORAL | 3 refills | Status: DC

## 2021-01-02 MED ORDER — GLIPIZIDE 10 MG PO TABS: 10.0000 mg | ORAL_TABLET | Freq: Two times a day (BID) | ORAL | 1 refills | Status: DC

## 2021-01-02 NOTE — Progress Notes (Signed)
Virtual Visit via Telephone Note  I connected with Cheryl Massey on 01/02/21 at  9:10 AM EST by telephone and verified that I am speaking with the correct person using two identifiers.  Location: Patient: home Provider: Mil[place] Cage [email or web address]    I discussed the limitations, risks, security and privacy concerns of performing an evaluation and management service by telephone and the availability of in person appointments. I also discussed with the patient that there may be a patient responsible charge related to this service. The patient expressed understanding and agreed to proceed.   History of Present Illness: Ms. Girl Schissler Cheryl Massey is a 65 year old female having follow up for HTN-Denies shortness of breath, headaches, chest pain or lower extremity edema, sudden onset, vision changes, unilateral weakness, dizziness, paresthesias she doesn't check Bp every day systolic  [id number] and  diastolic  26-20 .Type 2 diabetes ompliant with meds - Yes Checking CBGs? Yes  Fasting avg -    111-153 Exercising regularly? - Yes, Watching carbohydrate intake? - Yes, Neuropathy ? - No Hypoglycemic events - No Past Medical History:  Diagnosis Date  . Diabetes mellitus without complication ([place])   . Hypertension    Current Outpatient Medications on File Prior to Visit  Medication Sig Dispense Refill  . aspirin 81 MG EC tablet Take 1 tablet (81 mg total) by mouth daily. 30 tablet 0  . ibuprofen (ADVIL) 600 MG tablet Take 1 tablet (600 mg total) by mouth every 8 (eight) hours as needed for moderate pain. 90 tablet 2  . naproxen (NAPROSYN) 500 MG tablet Take 1 tablet (500 mg total) by mouth 2 (two) times daily with a meal. 30 tablet 0  . topiramate (TOPAMAX) 25 MG tablet Take 1 tablet (25 mg total) by mouth every 3 (three) days. 30 tablet 0   No current facility-administered medications on file prior to visit.     Observations/Objective: Pertinent ROS:  Polyuria - No Polydipsia  - No Vision problems - No   Assessment and Plan: Sybil was seen today for hypertension and diabetes.  Diagnoses and all orders for this visit:  Medication refill -     lisinopril (ZESTRIL) 5 MG tablet; Take 1 tablet (5 mg total) by mouth daily. -     atorvastatin (LIPITOR) 20 MG tablet; Take 1 tablet (20 mg total) by mouth daily. -     sitaGLIPtin (JANUVIA) 100 MG tablet; Take 1 tablet (100 mg total) by mouth daily. -     glipiZIDE (GLUCOTROL) 10 MG tablet; Take 1 tablet (10 mg total) by mouth 2 (two) times daily before a meal. -     Discontinue: metFORMIN (GLUCOPHAGE) 1000 MG tablet; Take 1 tablet (1,000 mg total) by mouth 2 (two) times daily with a meal. -     metFORMIN (GLUCOPHAGE) 1000 MG tablet; Take 1 tablet (1,000 mg total) by mouth 2 (two) times daily with a meal.  Essential hypertension Renal protection  -     lisinopril (ZESTRIL) 5 MG tablet; Take 1 tablet (5 mg total) by mouth daily. -     CBC with Differential/Platelet; Future -     CMP14+EGFR; Future  Mixed hyperlipidemia Continue to decrease your fatty foods, red meat, cheese, milk and increase fiber like whole grains and veggies. You can also add a fiber supplement like Metamucil or Benefiber.  -     atorvastatin (LIPITOR) 20 MG tablet; Take 1 tablet (20 mg total) by mouth daily. -     CBC  with Differential/Platelet; Future -     Lipid panel; Future  Type 2 diabetes mellitus treated without insulin ([place]) : Goal of therapy: Less than 6.5 hemoglobin A1c.  Continue to monitor foods that are high in carbohydrates are the following rice, potatoes, breads, sugars, and pastas.  Reduction in the intake (eating) will assist in lowering your blood sugars. -     Discontinue: metFORMIN (GLUCOPHAGE) 1000 MG tablet; Take 1 tablet (1,000 mg total) by mouth 2 (two) times daily with a meal. -     CBC with Differential/Platelet; Future -     Ambulatory referral to Ophthalmology -     metFORMIN (GLUCOPHAGE) 1000 MG tablet; Take 1 tablet  (1,000 mg total) by mouth 2 (two) times daily with a meal.    Follow Up Instructions:    I discussed the assessment and treatment plan with the patient. The patient was provided an opportunity to ask questions and all were answered. The patient agreed with the plan and demonstrated an understanding of the instructions.   The patient was advised to call back or seek an in-person evaluation if the symptoms worsen or if the condition fails to improve as anticipated.  I provided 22 minutes of non-face-to-face time during this encounter.   Kerin Perna, NP

## 2021-01-02 NOTE — Progress Notes (Signed)
I connected with  Cheryl Massey Cheryl Massey on 01/02/21 by a video enabled telemedicine application and verified that I am speaking with the correct person using two identifiers.   I discussed the limitations of evaluation and management by telemedicine. The patient expressed understanding and agreed to proceed.  CBG this am without food 121 Does check daily  Pt had a stroke and has difficulty talking so her daughter raquel is on the call with patient on speaker

## 2021-01-03 ENCOUNTER — Other Ambulatory Visit (INDEPENDENT_AMBULATORY_CARE_PROVIDER_SITE_OTHER): Payer: Self-pay

## 2021-01-03 DIAGNOSIS — E782 Mixed hyperlipidemia: Secondary | ICD-10-CM

## 2021-01-03 DIAGNOSIS — I10 Essential (primary) hypertension: Secondary | ICD-10-CM

## 2021-01-03 DIAGNOSIS — E119 Type 2 diabetes mellitus without complications: Secondary | ICD-10-CM

## 2021-01-04 LAB — LIPID PANEL
Chol/HDL Ratio: 3.8 ratio (ref 0.0–4.4)
Cholesterol, Total: 158 mg/dL (ref 100–199)
HDL: 42 mg/dL (ref >39)
LDL Chol Calc (NIH): 81 mg/dL (ref 0–99)
Triglycerides: 212 mg/dL — ABNORMAL HIGH (ref 0–149)
VLDL Cholesterol Cal: 35 mg/dL (ref 5–40)

## 2021-01-04 LAB — CMP14+EGFR
ALT: 13 IU/L (ref 0–32)
AST: 13 IU/L (ref 0–40)
Albumin/Globulin Ratio: 1.7 (ref 1.2–2.2)
Albumin: 4.4 g/dL (ref 3.8–4.8)
Alkaline Phosphatase: 87 IU/L (ref 44–121)
BUN/Creatinine Ratio: 20 (ref 12–28)
BUN: 16 mg/dL (ref 8–27)
Bilirubin Total: <0.2 mg/dL (ref 0.0–1.2)
CO2: 23 mmol/L (ref 20–29)
Calcium: 10 mg/dL (ref 8.7–10.3)
Chloride: 104 mmol/L (ref 96–106)
Creatinine, Ser: 0.82 mg/dL (ref 0.57–1.00)
GFR calc Af Amer: 88 mL/min/{1.73_m2} (ref >59)
GFR calc non Af Amer: 76 mL/min/{1.73_m2} (ref >59)
Globulin, Total: 2.6 g/dL (ref 1.5–4.5)
Glucose: 131 mg/dL — ABNORMAL HIGH (ref 65–99)
Potassium: 4.7 mmol/L (ref 3.5–5.2)
Sodium: 140 mmol/L (ref 134–144)
Total Protein: 7 g/dL (ref 6.0–8.5)

## 2021-01-04 LAB — CBC WITH DIFFERENTIAL/PLATELET
Basophils Absolute: 0 10*3/uL (ref 0.0–0.2)
Basos: 1 %
EOS (ABSOLUTE): 0.1 10*3/uL (ref 0.0–0.4)
Eos: 3 %
Hematocrit: 38.9 % (ref 34.0–46.6)
Hemoglobin: 12.7 g/dL (ref 11.1–15.9)
Immature Grans (Abs): 0 10*3/uL (ref 0.0–0.1)
Immature Granulocytes: 0 %
Lymphocytes Absolute: 1.4 10*3/uL (ref 0.7–3.1)
Lymphs: 30 %
MCH: 28.6 pg (ref 26.6–33.0)
MCHC: 32.6 g/dL (ref 31.5–35.7)
MCV: 88 fL (ref 79–97)
Monocytes Absolute: 0.5 10*3/uL (ref 0.1–0.9)
Monocytes: 10 %
Neutrophils Absolute: 2.6 10*3/uL (ref 1.4–7.0)
Neutrophils: 56 %
Platelets: 280 10*3/uL (ref 150–450)
RBC: 4.44 x10E6/uL (ref 3.77–5.28)
RDW: 12.3 % (ref 11.7–15.4)
WBC: 4.6 10*3/uL (ref 3.4–10.8)

## 2021-01-11 ENCOUNTER — Telehealth (INDEPENDENT_AMBULATORY_CARE_PROVIDER_SITE_OTHER): Payer: Self-pay

## 2021-01-11 NOTE — Telephone Encounter (Signed)
-----   Message from Kerin Perna, NP sent at 01/10/2021  1:36 PM EST ----- Labs are normal except Triglycerides continue to take atorvastatin 20mg  at bedtime and decrease your fatty foods, red meat, cheese, milk and increase fiber like whole grains and veggies.

## 2021-01-11 NOTE — Telephone Encounter (Signed)
Contacted patient with use of pacific interpreter [phone]). Patients daughter is aware that labs normal except elevated triglycerides. Continue to take atorvastatin at bedtime. Advised to reduce red meat, fatty food, milk, cheese and increase whole grains and veggies. She verbalized understanding. Nat Christen, CMA

## 2021-01-14 ENCOUNTER — Telehealth: Payer: Self-pay

## 2021-01-14 ENCOUNTER — Other Ambulatory Visit (INDEPENDENT_AMBULATORY_CARE_PROVIDER_SITE_OTHER): Payer: No Typology Code available for payment source

## 2021-01-14 NOTE — Telephone Encounter (Signed)
Patient's daughter ([name]) called and states patient was last seen 09/2020. Would like to have SU. She states she went to her PCP recently and all her labs were normal. Would like to know if she can have SU. Would like this done ASAP.   CB [phone]

## 2021-01-14 NOTE — Telephone Encounter (Signed)
She is aware. She will call pcp and will callus after blood work has been done.

## 2021-01-14 NOTE — Telephone Encounter (Signed)
It doesn't look like an A1C was drawn.  She needs that to be repeated because the last one was 9.0.  She can have this done at her PCP's office.

## 2021-01-24 ENCOUNTER — Encounter (INDEPENDENT_AMBULATORY_CARE_PROVIDER_SITE_OTHER): Payer: Self-pay | Admitting: Primary Care

## 2021-01-24 ENCOUNTER — Other Ambulatory Visit: Payer: Self-pay

## 2021-01-24 ENCOUNTER — Ambulatory Visit (INDEPENDENT_AMBULATORY_CARE_PROVIDER_SITE_OTHER): Payer: Self-pay | Admitting: Primary Care

## 2021-01-24 VITALS — BP 132/84 | HR 76 | Temp 97.3°F | Ht 63.0 in | Wt 149.2 lb

## 2021-01-24 DIAGNOSIS — M25552 Pain in left hip: Secondary | ICD-10-CM

## 2021-01-24 DIAGNOSIS — E119 Type 2 diabetes mellitus without complications: Secondary | ICD-10-CM

## 2021-01-24 DIAGNOSIS — I10 Essential (primary) hypertension: Secondary | ICD-10-CM

## 2021-01-24 LAB — POCT GLYCOSYLATED HEMOGLOBIN (HGB A1C): Hemoglobin A1C: 7.6 % — AB (ref 4.0–5.6)

## 2021-01-24 LAB — GLUCOSE, POCT (MANUAL RESULT ENTRY): POC Glucose: 164 mg/dl — AB (ref 70–99)

## 2021-01-24 NOTE — Progress Notes (Signed)
Established Patient Office Visit  Subjective:  Patient ID: Cheryl Massey, female    DOB: 01-23-1956  Age: 65 y.o. MRN: [id number]  CC:  Chief Complaint  Patient presents with  . Diabetes    HPI Ms. 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 DIABETES Hypoglycemic episodes:no, Polydipsia/polyuria:no,  Visual disturbance: no, Chest pain: no, Paresthesias: no Glucose Monitoring: yes fasting 95-130 . Complains of hip pain that radiates down to her foot 10/10-    Past Medical History:  Diagnosis Date  . Diabetes mellitus without complication ([place])   . Hypertension     Past Surgical History:  Procedure Laterality Date  . ABDOMINAL HYSTERECTOMY    . APPENDECTOMY      Family History  Problem Relation Age of Onset  . Hypertension Mother   . Hypertension Father     Social History   Socioeconomic History  . Marital status: Married    Spouse name: Not on file  . Number of children: Not on file  . Years of education: Not on file  . Highest education level: Not on file  Occupational History  . Not on file  Tobacco Use  . Smoking status: Never Smoker  . Smokeless tobacco: Never Used  Substance and Sexual Activity  . Alcohol use: No  . Drug use: No  . Sexual activity: Yes    Birth control/protection: Surgical  Other Topics Concern  . Not on file  Social History Narrative  . Not on file   Social Determinants of Health   Financial Resource Strain: Not on file  Food Insecurity: Not on file  Transportation Needs: Not on file  Physical Activity: Not on file  Stress: Not on file  Social Connections: Not on file  Intimate Partner Violence: Not on file    Outpatient Medications Prior to Visit  Medication Sig Dispense Refill  . aspirin 81 MG EC tablet Take 1 tablet (81 mg total) by mouth daily. 30 tablet 0  . atorvastatin (LIPITOR) 20 MG tablet Take 1  tablet (20 mg total) by mouth daily. 90 tablet 1  . glipiZIDE (GLUCOTROL) 10 MG tablet Take 1 tablet (10 mg total) by mouth 2 (two) times daily before a meal. 180 tablet 1  . ibuprofen (ADVIL) 600 MG tablet Take 1 tablet (600 mg total) by mouth every 8 (eight) hours as needed for moderate pain. 90 tablet 2  . lisinopril (ZESTRIL) 5 MG tablet Take 1 tablet (5 mg total) by mouth daily. 90 tablet 3  . metFORMIN (GLUCOPHAGE) 1000 MG tablet Take 1 tablet (1,000 mg total) by mouth 2 (two) times daily with a meal. 180 tablet `1  . sitaGLIPtin (JANUVIA) 100 MG tablet Take 1 tablet (100 mg total) by mouth daily. 90 tablet 1  . topiramate (TOPAMAX) 25 MG tablet Take 1 tablet (25 mg total) by mouth every 3 (three) days. 30 tablet 0  . naproxen (NAPROSYN) 500 MG tablet Take 1 tablet (500 mg total) by mouth 2 (two) times daily with a meal. 30 tablet 0   No facility-administered medications prior to visit.    No Known Allergies  ROS Review of Systems  Musculoskeletal:       Pain left leg hip to foot   All other systems reviewed and are negative.     Objective:    Physical Exam Vitals reviewed.  Constitutional:      Appearance: Normal  appearance.  HENT:     Head: Normocephalic.     Right Ear: Tympanic membrane and external ear normal.     Left Ear: Tympanic membrane and external ear normal.     Nose: Nose normal.  Eyes:     Extraocular Movements: Extraocular movements intact.     Pupils: Pupils are equal, round, and reactive to light.  Cardiovascular:     Rate and Rhythm: Normal rate and regular rhythm.  Pulmonary:     Effort: Pulmonary effort is normal.     Breath sounds: Normal breath sounds.  Abdominal:     General: Abdomen is flat. Bowel sounds are normal.     Palpations: Abdomen is soft.  Musculoskeletal:        General: Normal range of motion.     Cervical back: Normal range of motion.  Skin:    General: Skin is warm and dry.  Neurological:     Mental Status: She is alert and  oriented to person, place, and time.  Psychiatric:        Mood and Affect: Mood normal.        Behavior: Behavior normal.        Thought Content: Thought content normal.        Judgment: Judgment normal.     BP 132/84 (BP Location: Right Arm, Patient Position: Sitting, Cuff Size: Normal)   Pulse 76   Temp (!) 97.3 F (36.3 C) (Temporal)   Ht 5\' 3"  (1.6 m)   Wt 149 lb 3.2 oz (67.7 kg)   SpO2 97%   BMI 26.43 kg/m  Wt Readings from Last 3 Encounters:  01/24/21 149 lb 3.2 oz (67.7 kg)  10/05/20 153 lb 12.8 oz (69.8 kg)  10/01/20 153 lb 3.2 oz (69.5 kg)     Health Maintenance Due  Topic Date Due  . Hepatitis C Screening  Never done  . PNEUMOCOCCAL POLYSACCHARIDE VACCINE AGE 62-64 HIGH RISK  Never done  . OPHTHALMOLOGY EXAM  Never done  . TETANUS/TDAP  Never done  . PAP SMEAR-Modifier  Never done  . COLONOSCOPY (Pts 45-32yrs Insurance coverage will need to be confirmed)  Never done    There are no preventive care reminders to display for this patient.  Lab Results  Component Value Date   TSH 1.136 03/29/2019   Lab Results  Component Value Date   WBC 4.6 01/03/2021   HGB 12.7 01/03/2021   HCT 38.9 01/03/2021   MCV 88 01/03/2021   PLT 280 01/03/2021   Lab Results  Component Value Date   NA 140 01/03/2021   K 4.7 01/03/2021   CO2 23 01/03/2021   GLUCOSE 131 (H) 01/03/2021   BUN 16 01/03/2021   CREATININE 0.82 01/03/2021   BILITOT <0.2 01/03/2021   ALKPHOS 87 01/03/2021   AST 13 01/03/2021   ALT 13 01/03/2021   PROT 7.0 01/03/2021   ALBUMIN 4.4 01/03/2021   CALCIUM 10.0 01/03/2021   ANIONGAP 12 10/11/2019   Lab Results  Component Value Date   CHOL 158 01/03/2021   Lab Results  Component Value Date   HDL 42 01/03/2021   Lab Results  Component Value Date   LDLCALC 81 01/03/2021   Lab Results  Component Value Date   TRIG 212 (H) 01/03/2021   Lab Results  Component Value Date   CHOLHDL 3.8 01/03/2021   Lab Results  Component Value Date   HGBA1C  7.6 (A) 01/24/2021      Assessment & Plan:  Verdis Frederickson  was seen today for diabetes.  Diagnoses and all orders for this visit:  Type 2 diabetes mellitus treated without insulin ([facility]) -     HgB A1c 7.6  -     Glucose (CBG)  Goal of therapy: Less than 6.5 hemoglobin A1c. Improved from 9.0  10/21  Continue to monitor foods that are high in carbohydrates are the following rice, potatoes, breads, sugars, and pastas.  Reduction in the intake (eating) will assist in lowering your blood sugars.  Essential hypertension Counseled on blood pressure goal of less than 130/80, low-sodium, DASH diet, medication compliance, 150 minutes of moderate intensity exercise per week. Discussed medication compliance, adverse effects.  Left hip pain  Followed by Leandrew Koyanagi, MD plans for surgery when A1C is better controlled patient is currently using a rolling walker   Orthopedic Surgery  No orders of the defined types were placed in this encounter.   Follow-up: No follow-ups on file.    Kerin Perna, NP

## 2021-01-24 NOTE — Patient Instructions (Signed)
Diabetes mellitus y nutricin, en adultos Diabetes Mellitus and Nutrition, Adult Si sufre de diabetes, o diabetes mellitus, es muy importante tener hbitos alimenticios saludables debido a que sus niveles de Designer, television/film set sangre (glucosa) se ven afectados en gran medida por lo que come y bebe. [place] alimentos saludables en las cantidades correctas, aproximadamente a la misma hora todos los [place], [place] ayudar a:  Aeronautical engineer glucemia.  Disminuir el riesgo de sufrir una enfermedad cardaca.  Mejorar la presin arterial.  Science writer o mantener un peso saludable. Qu puede afectar mi plan de alimentacin? Todas las personas que sufren de diabetes son diferentes y cada una tiene necesidades diferentes en cuanto a un plan de alimentacin. El mdico puede recomendarle que trabaje con un nutricionista para elaborar el mejor plan para usted. Su plan de alimentacin puede variar segn factores como:  Las caloras que necesita.  Los medicamentos que toma.  Su peso.  Sus niveles de glucemia, presin arterial y colesterol.  Su nivel de [place].  Otras afecciones que tenga, como enfermedades cardacas o renales. Cmo me afectan los carbohidratos? Los carbohidratos, o hidratos de carbono, afectan su nivel de glucemia ms que cualquier otro tipo de alimento. La ingesta de carbohidratos naturalmente aumenta la cantidad de [employer]. El recuento de carbohidratos es un mtodo destinado a Catering manager un registro de la cantidad de carbohidratos que se consumen. El recuento de carbohidratos es importante para Theatre manager la glucemia a un nivel saludable, especialmente si utiliza insulina o toma determinados medicamentos por va oral para la diabetes. Es importante conocer la cantidad de carbohidratos que se pueden ingerir en cada comida sin correr Engineer, manufacturing. Esto es Psychologist, forensic. Su nutricionista puede ayudarlo a calcular la cantidad de carbohidratos que debe ingerir en cada comida y en cada  refrigerio. Cmo me afecta el alcohol? El alcohol puede provocar disminuciones sbitas de la glucemia (hipoglucemia), especialmente si utiliza insulina o toma determinados medicamentos por va oral para la diabetes. La hipoglucemia es una afeccin potencialmente mortal. Los sntomas de la hipoglucemia, como somnolencia, mareos y confusin, son similares a los sntomas de haber consumido demasiado alcohol.  No beba alcohol si: ? Su mdico le indica no hacerlo. ? Est embarazada, puede estar embarazada o est tratando de quedar embarazada.  Si bebe alcohol: ? No beba con el estmago vaco. ? Limite la cantidad que bebe:  De 0 a 1 medida por da para las mujeres.  De 0 a 2 medidas por da para los hombres. ? Est atento a la cantidad de alcohol que hay en las bebidas que toma. En los [place], una medida equivale a una botella de cerveza de 12oz (368m), un vaso de vino de 5oz (1468m o un vaso de una bebida alcohlica de alta graduacin de 1oz (4485m ? Mantngase hidratado bebiendo agua, refrescos dietticos o t helado sin azcar.  Tenga en cuenta que los refrescos comunes, los jugos y otras bebida para mezOptician, dispensingeden contener mucha azcar y se deben contar como carbohidratos. Consejos para seguir estPhotographers etiquetas de los alimentos  Comience por leer el tamao de la porcin en la "Informacin nutricional" en las etiquetas de los alimentos envasados y las bebidas. La cantidad de caloras, carbohidratos, grasas y otros nutrientes mencionados en la etiqueta se basan en una porcin del alimento. Muchos alimentos contienen ms de una porcin por envase.  Verifique la cantidad total de gramos (g) de carbohidratos totales en una porcin. Puede calcular la cantidad de porciones  de carbohidratos al dividir el total de carbohidratos por 15. Por ejemplo, si un alimento tiene un total de 30g de carbohidratos totales por porcin, equivale a 2 porciones de  carbohidratos.  Verifique la cantidad de gramos (g) de grasas saturadas y grasas trans de una porcin. Escoja alimentos que no contengan estas grasas o que su contenido de estas sea [place].  Verifique la cantidad de miligramos (mg) de sal (sodio) en una porcin. [place] [employer] de las personas deben limitar la ingesta de sodio total a menos de 2333m por dTraining and development officer  Siempre consulte la informacin nutricional de los alimentos etiquetados como "con bajo contenido de grasa" o "sin grasa". Estos alimentos pueden tener un mayor contenido de aLocation manageragregada o carbohidratos refinados, y deben evitarse.  Hable con su nutricionista para identificar sus objetivos diarios en cuanto a los nutrientes mencionados en la etiqueta. Al ir de compras  Evite comprar alimentos procesados, enlatados o precocidos. Estos alimentos tienden a tSpecial educational needs teachermayor cantidad de gF[place] sodio y azcar agregada.  Compre en la zona exterior de la tienda de comestibles. Esta es la zona donde se encuentran con mayor frecuencia las frutas y las verduras frescas, los cereales a granel, las carnes frescas y los productos lcteos frescos. Al cocinar  Utilice mtodos de coccin a baja temperatura, como hornear, en lugar de mtodos de coccin a alta temperatura, como frer en abundante aceite.  Cocine con aceites saludables, como el aceite de oFrontier canola o gPiketon  Evite cocinar con manteca, crema o carnes con alto contenido de grasa. Planificacin de las comidas  Coma las comidas y los refrigerios regularmente, preferentemente a la misma hora todos lKnight[place] Evite pasar largos perodos de tiempo sin comer.  Consuma alimentos ricos en fibra, como frutas frescas, verduras, frijoles y cereales integrales. Consulte a su nutricionista sobre cuntas porciones de carbohidratos puede consumir en cada comida.  Consuma entre 4 y 6 onzas (entre 112 y 168g) de protenas magras por da, como carnes magras, pollo, pescado, huevos o tofu. Una onza (oz) de  protena magra equivale a: ? 1 onza (28g) de carne, pollo o pescado. ? 1huevo. ?  de taza (62 g) de tofu.  Coma algunos alimentos por da que contengan grasas saludables, como aguacates, frutos secos, semillas y pescado.   Qu alimentos debo comer? FLambert ModyBayas. Manzanas. Naranjas. Duraznos. Damascos. Ciruelas. Uvas. Mango. Papaya. GTolna Kiwi. Cerezas. VHolland CommonsLValeda Malm Espinaca. Verduras de hBoeing que incluyen col rizada, aFolsom hojas de bIraqy de mFountain Remolachas. Coliflor. Repollo. Brcoli. Zanahorias. Judas verdes. Tomates. Pimientos. Cebollas. Pepinos. Coles de Bruselas. Granos Granos integrales, como panes, galletas, tortillas, cereales y pastas de salvado o integrales. Avena sin azcar. Quinua. Arroz integral o salvaje. Carnes y oPsychiatric nurse Carne de ave sin piel. Cortes magros de ave y carne de res. Tofu. Frutos secos. Semillas. Lcteos Productos lcteos sin grasa o con bajo contenido de gPepin cUpper Stewartsville yogur y qKahaluu-Keauhou Es posible que los productos que se enumeran ms aNew Caledoniano constituyan una lista completa de los alimentos y las bebidas que puede tomar. Consulte a un nutricionista para obtener ms informacin. Qu alimentos debo evitar? FLambert ModyFrutas enlatadas al almbar. Verduras Verduras enlatadas. Verduras congeladas con mantequilla o salsa de crema. Granos Productos elaborados con hIsraely hLao People's Democratic Republic como panes, pastas, bocadillos y cereales. Evite todos los alimentos procesados. Carnes y otras protenas Cortes de carne con alto contenido de gLobbyist Carne de ave con piel. Carnes empanizadas o fritas. Carne procesada. Evite las grasas saturadas.  los alimentos y las bebidas que debe evitar. Consulte a un nutricionista para obtener ms informacin. Preguntas para hacerle al mdico Es necesario que me rena con un instructor en el cuidado  de la diabetes? Es necesario que me rena con un nutricionista? A qu nmero puedo llamar si tengo preguntas? Cules son los mejores momentos para controlar la glucemia? Dnde encontrar ms informacin: Asociacin Estadounidense de la Diabetes (American Diabetes Association): diabetes.org Academy of Nutrition and Dietetics (Academia de Nutricin y Diettica): www.eatright.org National Institute of Diabetes and Digestive and Kidney Diseases (Instituto Nacional de la Diabetes y las Enfermedades Digestivas y Renales): www.niddk.nih.gov Association of Diabetes Care and Education Specialists (Asociacin de Especialistas en Atencin y Educacin sobre la Diabetes): www.diabeteseducator.org Resumen Es importante tener hbitos alimenticios saludables debido a que sus niveles de azcar en la sangre (glucosa) se ven afectados en gran medida por lo que come y bebe. Un plan de alimentacin saludable lo ayudar a controlar la glucemia y mantener un estilo de vida saludable. El mdico puede recomendarle que trabaje con un nutricionista para elaborar el mejor plan para usted. Tenga en cuenta que los carbohidratos (hidratos de carbono) y el alcohol tienen efectos inmediatos en sus niveles de glucemia. Es importante contar los carbohidratos que ingiere y consumir alcohol con prudencia. Esta informacin no tiene como fin reemplazar el consejo del mdico. Asegrese de hacerle al mdico cualquier pregunta que tenga. Document Revised: 01/05/2020 Document Reviewed: 01/05/2020 Elsevier Patient Education  2021 Elsevier Inc.  

## 2021-01-30 ENCOUNTER — Encounter: Payer: Self-pay | Admitting: Orthopaedic Surgery

## 2021-01-30 ENCOUNTER — Ambulatory Visit (INDEPENDENT_AMBULATORY_CARE_PROVIDER_SITE_OTHER): Payer: No Typology Code available for payment source | Admitting: Orthopaedic Surgery

## 2021-01-30 DIAGNOSIS — M1612 Unilateral primary osteoarthritis, left hip: Secondary | ICD-10-CM

## 2021-01-30 NOTE — Progress Notes (Signed)
Office Visit Note   Patient: Cheryl Massey           Date of Birth: [birth year]           MRN: [id number] Visit Date: 01/30/2021              Requested by: Kerin Perna, NP [address] [place],  [place] [address] PCP: Kerin Perna, NP   Assessment & Plan: Visit Diagnoses:  1. Unilateral primary osteoarthritis, left hip     Plan: Impression is left hip advanced degenerative joint disease.  She has not received much relief from conservative treatments such as medications, rollator, activity changes, cortisone injections.  We have again discussed treatment options to include left total hip replacement for which she would like to proceed.  We will obtain a prealbumin today and also primary care clearance due to her underlying diabetes and history of CVA for which she is currently on aspirin 81 mg.  Total face to face encounter time was greater than 25 minutes and over half of this time was spent in counseling and/or coordination of care.  Follow-Up Instructions: Return for 2 weeks post-op.   Orders:  No orders of the defined types were placed in this encounter.  No orders of the defined types were placed in this encounter.     Procedures: No procedures performed   Clinical Data: No additional findings.   Subjective: Chief Complaint  Patient presents with  . Left Hip - Pain    HPI patient is a pleasant 65 year old female who comes in today with continued left groin pain.  She does have an underlying history of advanced degenerative joint disease of the left hip.  She notes constant severe pain which is not relieved with medication.  She was seen by [place] back in October for this but we were unable to proceed with surgery due to her hemoglobin A1c being around 9.  She recently had lab work which showed a hemoglobin A1c of 7.6.  She is now ready to proceed with surgery.  Review of Systems as detailed in HPI.  All others reviewed and are  negative.   Objective: Vital Signs: There were no vitals taken for this visit.  Physical Exam well-developed well-nourished female no acute distress.  Alert and oriented x3.  Ortho Exam stable left hip exam.  Ambulates with rollator with limp.  Specialty Comments:  No specialty comments available.  Imaging: No new imaging   PMFS History: Patient Active Problem List   Diagnosis Date Noted  . Primary osteoarthritis of left hip 10/05/2020  . CVA (cerebral vascular accident) ([place]) 10/11/2019  . CVA (cerebrovascular accident) ([place]) 10/11/2019  . TIA (transient ischemic attack) 03/28/2019  . Essential hypertension 03/28/2019  . Type 2 diabetes mellitus treated without insulin ([place]) 03/28/2019   Past Medical History:  Diagnosis Date  . Diabetes mellitus without complication ([place])   . Hypertension     Family History  Problem Relation Age of Onset  . Hypertension Mother   . Hypertension Father     Past Surgical History:  Procedure Laterality Date  . ABDOMINAL HYSTERECTOMY    . APPENDECTOMY     Social History   Occupational History  . Not on file  Tobacco Use  . Smoking status: Never Smoker  . Smokeless tobacco: Never Used  Substance and Sexual Activity  . Alcohol use: No  . Drug use: No  . Sexual activity: Yes    Birth control/protection: Surgical

## 2021-01-31 LAB — PREALBUMIN: Prealbumin: 27 mg/dL (ref 17–34)

## 2021-01-31 LAB — EXTRA LAV TOP TUBE

## 2021-02-05 ENCOUNTER — Other Ambulatory Visit (INDEPENDENT_AMBULATORY_CARE_PROVIDER_SITE_OTHER): Payer: Self-pay | Admitting: Primary Care

## 2021-02-05 DIAGNOSIS — T84011D Broken internal left hip prosthesis, subsequent encounter: Secondary | ICD-10-CM

## 2021-02-06 ENCOUNTER — Telehealth (INDEPENDENT_AMBULATORY_CARE_PROVIDER_SITE_OTHER): Payer: Self-pay

## 2021-02-06 NOTE — Telephone Encounter (Signed)
Copied from [place] [id number]. Topic: Referral - Question >> Feb 06, 2021  9:49 AM Robina Ade, Helene Kelp D wrote: Reason for CRM: Patient daughter [name] called and would like to speak with someone about a referral to orthopedic and a surgery that patient needs. Please return her call back [phone], thank you.

## 2021-02-08 ENCOUNTER — Other Ambulatory Visit: Payer: Self-pay

## 2021-02-08 ENCOUNTER — Ambulatory Visit: Payer: Self-pay | Attending: Family Medicine

## 2021-02-08 ENCOUNTER — Ambulatory Visit (INDEPENDENT_AMBULATORY_CARE_PROVIDER_SITE_OTHER): Payer: Self-pay

## 2021-02-08 DIAGNOSIS — Y838 Other surgical procedures as the cause of abnormal reaction of the patient, or of later complication, without mention of misadventure at the time of the procedure: Secondary | ICD-10-CM | POA: Insufficient documentation

## 2021-02-08 DIAGNOSIS — T84011A Broken internal left hip prosthesis, initial encounter: Secondary | ICD-10-CM | POA: Insufficient documentation

## 2021-02-08 DIAGNOSIS — Z0279 Encounter for issue of other medical certificate: Secondary | ICD-10-CM

## 2021-02-10 NOTE — Patient Instructions (Signed)
Unable to perform EKG at [facility] sent to [facility]- Nurses visit for surgical clearance

## 2021-03-06 NOTE — Progress Notes (Signed)
Surgical Instructions   Your procedure is scheduled on Monday, March 28th.  Report to [facility] Main Entrance "A" at 8:00 A.M., then check in with the Admitting office.  Call this number if you have problems the morning of surgery:  [phone]   If you have any questions prior to your surgery date call [phone]: Open Monday-Friday 8am-4pm   Remember:  Do not eat after midnight the night before your surgery  You may drink clear liquids until 7:00 A.M. the morning of your surgery.   Clear liquids allowed are: Water, Non-Citrus Juices (without pulp), Carbonated Beverages, Clear Tea, Black Coffee Only, and Gatorade  Enhanced Recovery after Surgery for Orthopedics Enhanced Recovery after Surgery is a protocol used to improve the stress on your body and your recovery after surgery.  Patient Instructions  . The night before surgery:  o No food after midnight. ONLY clear liquids after midnight  . The day of surgery (if you have diabetes):  o Drink ONE small bottle of water by 7:00 A.M. the morning of surgery. This bottle was given to you during your hospital  pre-op appointment visit.  o Nothing else to drink after completing the  Small bottle of water.         If you have questions, please contact your surgeon's office    Take these medicines the morning of surgery with A SIP OF WATER  atorvastatin (LIPITOR)  Follow your surgeon's instructions on when to stop Aspirin.  If no instructions were given by your surgeon then you will need to call the office to get those instructions.    As of today, STOP taking Aleve, Naproxen, Ibuprofen, Motrin, Advil, Goody's, BC's, all herbal medications, fish oil, and all vitamins.          WHAT DO I DO ABOUT MY DIABETES MEDICATION?  The morning of surgery Do NOT take  glipiZIDE (GLUCOTROL), metFORMIN (GLUCOPHAGE), or sitaGLIPtin (JANUVIA).    . If your CBG is greater than 220 mg/dL, you may take  of your sliding scale (correction) dose of  insulin.  HOW TO MANAGE YOUR DIABETES BEFORE AND AFTER SURGERY  Why is it important to control my blood sugar before and after surgery? . Improving blood sugar levels before and after surgery helps healing and can limit problems. . A way of improving blood sugar control is eating a healthy diet by: o  Eating less sugar and carbohydrates o  Increasing activity/exercise o  Talking with your doctor about reaching your blood sugar goals . High blood sugars (greater than 180 mg/dL) can raise your risk of infections and slow your recovery, so you will need to focus on controlling your diabetes during the weeks before surgery. . Make sure that the doctor who takes care of your diabetes knows about your planned surgery including the date and location.  How do I manage my blood sugar before surgery? . Check your blood sugar at least 4 times a day, starting 2 days before surgery, to make sure that the level is not too high or low. . Check your blood sugar the morning of your surgery when you wake up and every 2 hours until you get to the Short Stay unit. o If your blood sugar is less than 70 mg/dL, you will need to treat for low blood sugar: - Do not take insulin. - Treat a low blood sugar (less than 70 mg/dL) with  cup of clear juice (cranberry or apple), 4 glucose tablets, OR glucose gel. -  Recheck blood sugar in 15 minutes after treatment (to make sure it is greater than 70 mg/dL). If your blood sugar is not greater than 70 mg/dL on recheck, call [phone] for further instructions. . Report your blood sugar to the short stay nurse when you get to Short Stay.  . If you are admitted to the hospital after surgery: o Your blood sugar will be checked by the staff and you will probably be given insulin after surgery (instead of oral diabetes medicines) to make sure you have good blood sugar levels. o The goal for blood sugar control after surgery is 80-180 mg/dL.             Do not wear jewelry,  make up, or nail polish            Do not wear lotions, powders, perfumes, or deodorant.            Do not shave 48 hours prior to surgery.              Do not bring valuables to the hospital.            [facility] is not responsible for any belongings or valuables.  Do NOT Smoke (Tobacco/Vaping) or drink Alcohol 24 hours prior to your procedure If you use a CPAP at night, you may bring all equipment for your overnight stay.   Contacts, glasses, dentures or bridgework may not be worn into surgery, please bring cases for these belongings   For patients admitted to the hospital, discharge time will be determined by your treatment team.   Patients discharged the day of surgery will not be allowed to drive home, and someone needs to stay with them for 24 hours.  Special instructions:   [place]- Preparing For Surgery  Before surgery, you can play an important role. Because skin is not sterile, your skin needs to be as free of germs as possible. You can reduce the number of germs on your skin by washing with CHG (chlorahexidine gluconate) Soap before surgery.  CHG is an antiseptic cleaner which kills germs and bonds with the skin to continue killing germs even after washing.    Oral Hygiene is also important to reduce your risk of infection.  Remember - BRUSH YOUR TEETH THE MORNING OF SURGERY WITH YOUR REGULAR TOOTHPASTE  Please do not use if you have an allergy to CHG or antibacterial soaps. If your skin becomes reddened/irritated stop using the CHG.  Do not shave (including legs and underarms) for at least 48 hours prior to first CHG shower. It is OK to shave your face.  Please follow these instructions carefully.   1. Shower the NIGHT BEFORE SURGERY and the MORNING OF SURGERY  2. If you chose to wash your hair, wash your hair first as usual with your normal shampoo.  After you shampoo, rinse your hair and body thoroughly to remove the shampoo.  3. Wash Face and genitals (private  parts) with your normal soap.   4.  Shower the NIGHT BEFORE SURGERY and the MORNING OF SURGERY with CHG Soap.   5. Use CHG Soap as you would any other liquid soap. You can apply CHG directly to the skin and wash gently with a scrungie or a clean washcloth.   6. Apply the CHG Soap to your body ONLY FROM THE NECK DOWN.  Do not use on open wounds or open sores. Avoid contact with your eyes, ears, mouth and genitals (private parts). Wash  Face and genitals (private parts)  with your normal soap.   7. Wash thoroughly, paying special attention to the area where your surgery will be performed.  8. Thoroughly rinse your body with warm water from the neck down.  9. DO NOT shower/wash with your normal soap after using and rinsing off the CHG Soap.  10. Pat yourself dry with a CLEAN TOWEL.  11. Wear CLEAN PAJAMAS to bed the night before surgery  12. Place CLEAN SHEETS on your bed the night before your surgery  13. DO NOT SLEEP WITH PETS.  Day of Surgery: Shower with CHG soap Wear Clean/Comfortable clothing the morning of surgery Do not apply any deodorants/lotions.   Remember to brush your teeth WITH YOUR REGULAR TOOTHPASTE.   Please read over the following fact sheets that you were given.

## 2021-03-07 ENCOUNTER — Encounter (HOSPITAL_COMMUNITY)
Admission: RE | Admit: 2021-03-07 | Discharge: 2021-03-07 | Disposition: A | Discharge status: Home / Self Care | Payer: Self-pay | Source: Ambulatory Visit | Attending: Orthopaedic Surgery | Admitting: Orthopaedic Surgery

## 2021-03-07 ENCOUNTER — Encounter (HOSPITAL_COMMUNITY): Payer: Self-pay

## 2021-03-07 ENCOUNTER — Emergency Department (HOSPITAL_COMMUNITY): Payer: Self-pay

## 2021-03-07 ENCOUNTER — Other Ambulatory Visit: Payer: Self-pay

## 2021-03-07 ENCOUNTER — Other Ambulatory Visit: Payer: Self-pay | Admitting: Physician Assistant

## 2021-03-07 ENCOUNTER — Emergency Department (HOSPITAL_COMMUNITY)
Admission: EM | Admit: 2021-03-07 | Discharge: 2021-03-07 | Disposition: A | Discharge status: Home / Self Care | Payer: Self-pay | Attending: Physician Assistant | Admitting: Physician Assistant

## 2021-03-07 DIAGNOSIS — M1612 Unilateral primary osteoarthritis, left hip: Secondary | ICD-10-CM

## 2021-03-07 DIAGNOSIS — Z7982 Long term (current) use of aspirin: Secondary | ICD-10-CM | POA: Insufficient documentation

## 2021-03-07 DIAGNOSIS — Z79899 Other long term (current) drug therapy: Secondary | ICD-10-CM | POA: Insufficient documentation

## 2021-03-07 DIAGNOSIS — Z7984 Long term (current) use of oral hypoglycemic drugs: Secondary | ICD-10-CM | POA: Insufficient documentation

## 2021-03-07 DIAGNOSIS — I10 Essential (primary) hypertension: Secondary | ICD-10-CM | POA: Insufficient documentation

## 2021-03-07 DIAGNOSIS — Z20822 Contact with and (suspected) exposure to covid-19: Secondary | ICD-10-CM | POA: Insufficient documentation

## 2021-03-07 DIAGNOSIS — Z5321 Procedure and treatment not carried out due to patient leaving prior to being seen by health care provider: Secondary | ICD-10-CM | POA: Insufficient documentation

## 2021-03-07 DIAGNOSIS — E119 Type 2 diabetes mellitus without complications: Secondary | ICD-10-CM | POA: Insufficient documentation

## 2021-03-07 HISTORY — DX: Unspecified osteoarthritis, unspecified site: M19.90

## 2021-03-07 HISTORY — DX: Cerebral infarction, unspecified: I63.9

## 2021-03-07 LAB — URINALYSIS, ROUTINE W REFLEX MICROSCOPIC
Bacteria, UA: NONE SEEN
Bilirubin Urine: NEGATIVE
Glucose, UA: NEGATIVE mg/dL
Hgb urine dipstick: NEGATIVE
Ketones, ur: 5 mg/dL — AB
Leukocytes,Ua: NEGATIVE
Nitrite: NEGATIVE
Protein, ur: 30 mg/dL — AB
Specific Gravity, Urine: 1.026 (ref 1.005–1.030)
pH: 5 (ref 5.0–8.0)

## 2021-03-07 LAB — CBC WITH DIFFERENTIAL/PLATELET
Abs Immature Granulocytes: 0.02 10*3/uL (ref 0.00–0.07)
Basophils Absolute: 0 10*3/uL (ref 0.0–0.1)
Basophils Relative: 1 %
Eosinophils Absolute: 0.2 10*3/uL (ref 0.0–0.5)
Eosinophils Relative: 3 %
HCT: 36.7 % (ref 36.0–46.0)
Hemoglobin: 12.3 g/dL (ref 12.0–15.0)
Immature Granulocytes: 0 %
Lymphocytes Relative: 26 %
Lymphs Abs: 1.7 10*3/uL (ref 0.7–4.0)
MCH: 29.5 pg (ref 26.0–34.0)
MCHC: 33.5 g/dL (ref 30.0–36.0)
MCV: 88 fL (ref 80.0–100.0)
Monocytes Absolute: 0.6 10*3/uL (ref 0.1–1.0)
Monocytes Relative: 9 %
Neutro Abs: 4 10*3/uL (ref 1.7–7.7)
Neutrophils Relative %: 61 %
Platelets: 315 10*3/uL (ref 150–400)
RBC: 4.17 MIL/uL (ref 3.87–5.11)
RDW: 12.8 % (ref 11.5–15.5)
WBC: 6.5 10*3/uL (ref 4.0–10.5)
nRBC: 0 % (ref 0.0–0.2)

## 2021-03-07 LAB — COMPREHENSIVE METABOLIC PANEL
ALT: 18 U/L (ref 0–44)
AST: 22 U/L (ref 15–41)
Albumin: 4.3 g/dL (ref 3.5–5.0)
Alkaline Phosphatase: 62 U/L (ref 38–126)
Anion gap: 11 (ref 5–15)
BUN: 19 mg/dL (ref 8–23)
CO2: 25 mmol/L (ref 22–32)
Calcium: 9.5 mg/dL (ref 8.9–10.3)
Chloride: 102 mmol/L (ref 98–111)
Creatinine, Ser: 0.81 mg/dL (ref 0.44–1.00)
GFR, Estimated: >60 mL/min (ref >60)
Glucose, Bld: 94 mg/dL (ref 70–99)
Potassium: 3.7 mmol/L (ref 3.5–5.1)
Sodium: 138 mmol/L (ref 135–145)
Total Bilirubin: 0.3 mg/dL (ref 0.3–1.2)
Total Protein: 7.6 g/dL (ref 6.5–8.1)

## 2021-03-07 LAB — GLUCOSE, CAPILLARY: Glucose-Capillary: 126 mg/dL — ABNORMAL HIGH (ref 70–99)

## 2021-03-07 LAB — APTT: aPTT: 28 seconds (ref 24–36)

## 2021-03-07 LAB — SURGICAL PCR SCREEN
MRSA, PCR: NEGATIVE
Staphylococcus aureus: NEGATIVE

## 2021-03-07 LAB — TYPE AND SCREEN
ABO/RH(D): A POS
Antibody Screen: NEGATIVE

## 2021-03-07 LAB — PROTIME-INR
INR: 1 (ref 0.8–1.2)
Prothrombin Time: 12.3 seconds (ref 11.4–15.2)

## 2021-03-07 LAB — SARS CORONAVIRUS 2 (TAT 6-24 HRS): SARS Coronavirus 2: NEGATIVE

## 2021-03-07 MED ORDER — SULFAMETHOXAZOLE-TRIMETHOPRIM 800-160 MG PO TABS: 1.0000 | ORAL_TABLET | Freq: Two times a day (BID) | ORAL | 0 refills | Status: DC

## 2021-03-07 MED ORDER — DOCUSATE SODIUM 100 MG PO CAPS: 100.0000 mg | ORAL_CAPSULE | Freq: Every day | ORAL | 2 refills | Status: AC | PRN

## 2021-03-07 MED ORDER — OXYCODONE-ACETAMINOPHEN 5-325 MG PO TABS: 1.0000 | ORAL_TABLET | Freq: Four times a day (QID) | ORAL | 0 refills | Status: DC | PRN

## 2021-03-07 MED ORDER — ONDANSETRON HCL 4 MG PO TABS: 4.0000 mg | ORAL_TABLET | Freq: Three times a day (TID) | ORAL | 0 refills | Status: DC | PRN

## 2021-03-07 MED ORDER — METHOCARBAMOL 500 MG PO TABS: 500.0000 mg | ORAL_TABLET | Freq: Two times a day (BID) | ORAL | 0 refills | Status: DC | PRN

## 2021-03-07 MED ORDER — ASPIRIN EC 81 MG PO TBEC: 81.0000 mg | DELAYED_RELEASE_TABLET | Freq: Two times a day (BID) | ORAL | 0 refills | Status: DC

## 2021-03-07 NOTE — Progress Notes (Signed)
Surgical Instructions   Your procedure is scheduled on Monday, March 28th.  Report to [facility] Main Entrance "A" at 8:00 A.M., then check in with the Admitting office.  Call this number if you have problems the morning of surgery:  [phone]   If you have any questions prior to your surgery date call [phone]: Open Monday-Friday 8am-4pm   Remember:  Do not eat after midnight the night before your surgery  You may drink clear liquids until 7:00 A.M. the morning of your surgery.   Clear liquids allowed are: Water, Non-Citrus Juices (without pulp), Carbonated Beverages, Clear Tea, Black Coffee Only, and Gatorade  Enhanced Recovery after Surgery for Orthopedics Enhanced Recovery after Surgery is a protocol used to improve the stress on your body and your recovery after surgery.  Patient Instructions  . The night before surgery:  o No food after midnight. ONLY clear liquids after midnight  . The day of surgery (if you have diabetes):  o Drink ONE small bottle of water by 7:00 A.M. the morning of surgery. This bottle was given to you during your hospital  pre-op appointment visit.  o Nothing else to drink after completing the  Small bottle of water.         If you have questions, please contact your surgeon's office    Take these medicines the morning of surgery with A SIP OF WATER  atorvastatin (LIPITOR)  Follow your surgeon's instructions on when to stop Aspirin.  If no instructions were given by your surgeon then you will need to call the office to get those instructions.    As of today, STOP taking Aleve, Naproxen, Ibuprofen, Motrin, Advil, Goody's, BC's, all herbal medications, fish oil, and all vitamins.          WHAT DO I DO ABOUT MY DIABETES MEDICATION?  The day before surgery Do not take your evening dose of glipiZIDE (GLUCOTROL).  The morning of surgery Do NOT take  glipiZIDE (GLUCOTROL), metFORMIN (GLUCOPHAGE), or sitaGLIPtin (JANUVIA).    . If your CBG is  greater than 220 mg/dL, you may take  of your sliding scale (correction) dose of insulin.  HOW TO MANAGE YOUR DIABETES BEFORE AND AFTER SURGERY  Why is it important to control my blood sugar before and after surgery? . Improving blood sugar levels before and after surgery helps healing and can limit problems. . A way of improving blood sugar control is eating a healthy diet by: o  Eating less sugar and carbohydrates o  Increasing activity/exercise o  Talking with your doctor about reaching your blood sugar goals . High blood sugars (greater than 180 mg/dL) can raise your risk of infections and slow your recovery, so you will need to focus on controlling your diabetes during the weeks before surgery. . Make sure that the doctor who takes care of your diabetes knows about your planned surgery including the date and location.  How do I manage my blood sugar before surgery? . Check your blood sugar at least 4 times a day, starting 2 days before surgery, to make sure that the level is not too high or low. . Check your blood sugar the morning of your surgery when you wake up and every 2 hours until you get to the Short Stay unit. o If your blood sugar is less than 70 mg/dL, you will need to treat for low blood sugar: - Do not take insulin. - Treat a low blood sugar (less than 70 mg/dL) with  cup of clear juice (cranberry or apple), 4 glucose tablets, OR glucose gel. - Recheck blood sugar in 15 minutes after treatment (to make sure it is greater than 70 mg/dL). If your blood sugar is not greater than 70 mg/dL on recheck, call [phone] for further instructions. . Report your blood sugar to the short stay nurse when you get to Short Stay.  . If you are admitted to the hospital after surgery: o Your blood sugar will be checked by the staff and you will probably be given insulin after surgery (instead of oral diabetes medicines) to make sure you have good blood sugar levels. o The goal for blood  sugar control after surgery is 80-180 mg/dL.             Do not wear jewelry, make up, or nail polish            Do not wear lotions, powders, perfumes, or deodorant.            Do not shave 48 hours prior to surgery.              Do not bring valuables to the hospital.            [facility] is not responsible for any belongings or valuables.  Do NOT Smoke (Tobacco/Vaping) or drink Alcohol 24 hours prior to your procedure If you use a CPAP at night, you may bring all equipment for your overnight stay.   Contacts, glasses, dentures or bridgework may not be worn into surgery, please bring cases for these belongings   For patients admitted to the hospital, discharge time will be determined by your treatment team.   Patients discharged the day of surgery will not be allowed to drive home, and someone needs to stay with them for 24 hours.  Special instructions:   [place]- Preparing For Surgery  Before surgery, you can play an important role. Because skin is not sterile, your skin needs to be as free of germs as possible. You can reduce the number of germs on your skin by washing with CHG (chlorahexidine gluconate) Soap before surgery.  CHG is an antiseptic cleaner which kills germs and bonds with the skin to continue killing germs even after washing.    Oral Hygiene is also important to reduce your risk of infection.  Remember - BRUSH YOUR TEETH THE MORNING OF SURGERY WITH YOUR REGULAR TOOTHPASTE  Please do not use if you have an allergy to CHG or antibacterial soaps. If your skin becomes reddened/irritated stop using the CHG.  Do not shave (including legs and underarms) for at least 48 hours prior to first CHG shower. It is OK to shave your face.  Please follow these instructions carefully.   1. Shower the NIGHT BEFORE SURGERY and the MORNING OF SURGERY  2. If you chose to wash your hair, wash your hair first as usual with your normal shampoo.  After you shampoo, rinse your hair and  body thoroughly to remove the shampoo.  3. Wash Face and genitals (private parts) with your normal soap.   4.  Shower the NIGHT BEFORE SURGERY and the MORNING OF SURGERY with CHG Soap.   5. Use CHG Soap as you would any other liquid soap. You can apply CHG directly to the skin and wash gently with a scrungie or a clean washcloth.   6. Apply the CHG Soap to your body ONLY FROM THE NECK DOWN.  Do not use on open wounds or  open sores. Avoid contact with your eyes, ears, mouth and genitals (private parts). Wash Face and genitals (private parts)  with your normal soap.   7. Wash thoroughly, paying special attention to the area where your surgery will be performed.  8. Thoroughly rinse your body with warm water from the neck down.  9. DO NOT shower/wash with your normal soap after using and rinsing off the CHG Soap.  10. Pat yourself dry with a CLEAN TOWEL.  11. Wear CLEAN PAJAMAS to bed the night before surgery  12. Place CLEAN SHEETS on your bed the night before your surgery  13. DO NOT SLEEP WITH PETS.  Day of Surgery: Shower with CHG soap Wear Clean/Comfortable clothing the morning of surgery Do not apply any deodorants/lotions.   Remember to brush your teeth WITH YOUR REGULAR TOOTHPASTE.   Please read over the following fact sheets that you were given.

## 2021-03-07 NOTE — Progress Notes (Signed)
PCP - Kerin Perna, NP Cardiologist - denies  PPM/ICD - denies Device Orders - denies Rep Notified - denies  Chest x-ray -  03/07/2021 EKG - 02/08/2021 Stress Test - denies ECHO - 10/13/2019 Cardiac Cath - denies  Sleep Study - denies  Fasting Blood Sugar - 126 Checks Blood Sugar __1___ times a day A1C - 7.6 on 02/03/2021  Blood Thinner Instructions:n/a Aspirin Instructions: Follow your surgeon's instructions on when to stop Aspirin. If no instructions were given by your surgeon then you will need to call the office to get those instructions.    ERAS Protcol - yes PRE-SURGERY water   COVID TEST- 03/07/2021   Anesthesia review: yes, A1C 7.6 02/03/2021  Patient denies shortness of breath, fever, cough and chest pain at PAT appointment   All instructions explained to the patient, with a verbal understanding of the material. Patient agrees to go over the instructions while at home for a better understanding. Patient also instructed to self quarantine after being tested for COVID-19. The opportunity to ask questions was provided.

## 2021-03-08 ENCOUNTER — Encounter (HOSPITAL_COMMUNITY): Payer: Self-pay

## 2021-03-08 MED ORDER — TRANEXAMIC ACID-NACL 1000-0.7 MG/100ML-% IV SOLN
1000.0000 mg | INTRAVENOUS | Status: AC
  Administered 2021-03-11: 1000 mg via INTRAVENOUS
  Filled 2021-03-08 (×2): qty 100

## 2021-03-08 MED ORDER — TRANEXAMIC ACID 1000 MG/10ML IV SOLN
2000.0000 mg | INTRAVENOUS | Status: DC
  Filled 2021-03-08 (×2): qty 20

## 2021-03-08 NOTE — Progress Notes (Signed)
Anesthesia Chart Review:  Case: [id number] Date/Time: 03/11/21 0945   Procedure: LEFT TOTAL HIP ARTHROPLASTY ANTERIOR APPROACH (Left Hip)   Anesthesia type: Spinal   Pre-op diagnosis: left hip osteoarthritis   Location: [facility] OR ROOM 04 / [place] OR   Surgeons: Leandrew Koyanagi, MD      DISCUSSION: Patient is a 65 year old female scheduled for the above procedure. Surgery delayed until A1c improved, now 7.6% on 01/24/21, down from 9.0% 10/01/20.   History includes never smoker, HTN, DM2, CVA (03/28/19).   03/07/2021 presurgical COVID-19 test negative.  Anesthesia team to evaluate on the day of surgery.  VS: BP (!) 141/78   Pulse 76   Temp 36.7 C (Oral)   Resp 18   Ht 5\' 3"  (1.6 m)   Wt 66.8 kg   SpO2 100%   BMI 26.08 kg/m     PROVIDERS: Kerin Perna, NP is PCP  Antony Contras, MD is neurologist   LABS: Labs reviewed: Acceptable for surgery. A1c 7.6% 01/24/21 (reviewed by Dr. Erlinda Hong). (all labs ordered are listed, but only abnormal results are displayed)  Labs Reviewed  GLUCOSE, CAPILLARY - Abnormal; Notable for the following components:      Result Value   Glucose-Capillary 126 (*)    All other components within normal limits  URINALYSIS, ROUTINE W REFLEX MICROSCOPIC - Abnormal; Notable for the following components:   Ketones, ur 5 (*)    Protein, ur 30 (*)    All other components within normal limits  SURGICAL PCR SCREEN  SARS CORONAVIRUS 2 (TAT 6-24 HRS)  CBC WITH DIFFERENTIAL/PLATELET  COMPREHENSIVE METABOLIC PANEL  PROTIME-INR  APTT  TYPE AND SCREEN    IMAGES: CXR 03/07/21: FINDINGS: Lungs are clear. Heart size and pulmonary vascularity are normal. No adenopathy. There is degenerative change in the thoracic spine. IMPRESSION: Lungs clear.  Cardiac silhouette normal.  CT Head & CTA head/neck 10/11/19: IMPRESSION: 1. No carotid bifurcation disease on either side. 2. 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 In April this was stenotic but  patent. Distal branch vessels show some opacification presumably secondary to collateral supply. 3. CT perfusion shows a 14 CC region of T-max greater than 6 seconds in the left temporal and parietal region which is showing luxury perfusion. This is consistent with completed infarction with luxury perfusion. Considering that, the region appears fairly well matched to the area of infarction and I doubt that there is a large region of continued at risk brain. 4. Both vertebral arteries are widely patent to the basilar. Left vertebral artery arises directly from the arch.   EKG: 02/08/21: SB at 53 bpm.   CV: Echo 10/13/19: IMPRESSIONS  1. Left ventricular ejection fraction, by visual estimation, is 60 to  65%. The left ventricle has normal function. There is no left ventricular  hypertrophy.  2. Left ventricular diastolic parameters are consistent with Grade I  diastolic dysfunction (impaired relaxation).  3. The left ventricle has no regional wall motion abnormalities.  4. Global right ventricle has normal systolic function.The right  ventricular size is normal. No increase in right ventricular wall  thickness.  5. Left atrial size was normal.  6. Right atrial size was normal.  7. Presence of pericardial fat pad.  8. Trivial pericardial effusion is present.  9. The mitral valve is grossly normal. No evidence of mitral valve  regurgitation.  10. The tricuspid valve is grossly normal. Tricuspid valve regurgitation  is not demonstrated.  11. The aortic valve is tricuspid.  Aortic valve regurgitation is not  visualized. No evidence of aortic valve sclerosis or stenosis.  12. The pulmonic valve was grossly normal. Pulmonic valve regurgitation is  not visualized.  13. TR signal is inadequate for assessing pulmonary artery systolic  pressure.  14. The inferior vena cava is normal in size with greater than 50%  respiratory variability, suggesting right atrial pressure of 3  mmHg.  - In comparison to the previous echocardiogram(s): No change.    Past Medical History:  Diagnosis Date  . Arthritis    hands  . Diabetes mellitus without complication ([place])   . Hypertension   . Stroke [facility])    3-4 years ago    Past Surgical History:  Procedure Laterality Date  . ABDOMINAL HYSTERECTOMY    . APPENDECTOMY      MEDICATIONS: . oxyCODONE-acetaminophen (PERCOCET) 5-325 MG tablet  . aspirin 81 MG EC tablet  . aspirin EC 81 MG tablet  . atorvastatin (LIPITOR) 20 MG tablet  . docusate sodium (COLACE) 100 MG capsule  . glipiZIDE (GLUCOTROL) 10 MG tablet  . lisinopril (ZESTRIL) 5 MG tablet  . metFORMIN (GLUCOPHAGE) 1000 MG tablet  . metFORMIN (GLUCOPHAGE) 500 MG tablet  . methocarbamol (ROBAXIN) 500 MG tablet  . ondansetron (ZOFRAN) 4 MG tablet  . sitaGLIPtin (JANUVIA) 100 MG tablet  . sulfamethoxazole-trimethoprim (BACTRIM DS) 800-160 MG tablet  . topiramate (TOPAMAX) 25 MG tablet   No current facility-administered medications for this encounter.    Myra Gianotti, PA-C Surgical Short Stay/Anesthesiology [facility] Phone [phone] [facility] Phone [phone] 03/08/2021 10:39 AM

## 2021-03-08 NOTE — Anesthesia Preprocedure Evaluation (Addendum)
Anesthesia Evaluation  Patient identified by MRN, date of birth, ID band Patient awake    Reviewed: Allergy & Precautions, NPO status , Patient's Chart, lab work & pertinent test results  History of Anesthesia Complications Negative for: history of anesthetic complications  Airway Mallampati: II  TM Distance: >3 FB Neck ROM: Full    Dental no notable dental hx.    Pulmonary neg pulmonary ROS,    Pulmonary exam normal        Cardiovascular hypertension, Normal cardiovascular exam  Echo 10/13/19: EF 60-65%, grade I DD, valves ok   Neuro/Psych TIACVA (2020, expressive aphasia), Residual Symptoms negative psych ROS   GI/Hepatic negative GI ROS, Neg liver ROS,   Endo/Other  diabetes, Type 2, Oral Hypoglycemic Agents  Renal/GU negative Renal ROS  negative genitourinary   Musculoskeletal  (+) Arthritis ,   Abdominal   Peds  Hematology negative hematology ROS (+)   Anesthesia Other Findings Day of surgery medications reviewed with patient.  Reproductive/Obstetrics negative OB ROS                           Anesthesia Physical Anesthesia Plan  ASA: II  Anesthesia Plan: Spinal   Post-op Pain Management:    Induction:   PONV Risk Score and Plan: 3 and Treatment may vary due to age or medical condition, Ondansetron, Propofol infusion, Dexamethasone and Midazolam  Airway Management Planned: Natural Airway and Simple Face Mask  Additional Equipment: None  Intra-op Plan:   Post-operative Plan:   Informed Consent: I have reviewed the patients History and Physical, chart, labs and discussed the procedure including the risks, benefits and alternatives for the proposed anesthesia with the patient or authorized representative who has indicated his/her understanding and acceptance.     Interpreter used for [employer] Discussed with: CRNA  Anesthesia Plan Comments: (PAT note written  03/08/2021 by Myra Gianotti, PA-C. )      Anesthesia Quick Evaluation

## 2021-03-11 ENCOUNTER — Ambulatory Visit (HOSPITAL_COMMUNITY): Payer: Self-pay

## 2021-03-11 ENCOUNTER — Encounter (HOSPITAL_COMMUNITY): Payer: Self-pay | Admitting: Orthopaedic Surgery

## 2021-03-11 ENCOUNTER — Observation Stay (HOSPITAL_COMMUNITY)
Admission: RE | Admit: 2021-03-11 | Discharge: 2021-03-12 | Disposition: A | Discharge status: Home / Self Care | Payer: Self-pay | Attending: Orthopaedic Surgery | Admitting: Orthopaedic Surgery

## 2021-03-11 ENCOUNTER — Other Ambulatory Visit: Payer: Self-pay

## 2021-03-11 ENCOUNTER — Encounter (HOSPITAL_COMMUNITY)
Admission: RE | Disposition: A | Discharge status: Home / Self Care | Payer: Self-pay | Source: Home / Self Care | Attending: Orthopaedic Surgery

## 2021-03-11 ENCOUNTER — Ambulatory Visit (HOSPITAL_COMMUNITY): Payer: Self-pay | Admitting: Vascular Surgery

## 2021-03-11 ENCOUNTER — Ambulatory Visit (HOSPITAL_COMMUNITY): Payer: Self-pay | Admitting: Certified Registered Nurse Anesthetist

## 2021-03-11 ENCOUNTER — Observation Stay (HOSPITAL_COMMUNITY): Payer: Self-pay

## 2021-03-11 DIAGNOSIS — Z8673 Personal history of transient ischemic attack (TIA), and cerebral infarction without residual deficits: Secondary | ICD-10-CM | POA: Insufficient documentation

## 2021-03-11 DIAGNOSIS — I10 Essential (primary) hypertension: Secondary | ICD-10-CM | POA: Insufficient documentation

## 2021-03-11 DIAGNOSIS — E119 Type 2 diabetes mellitus without complications: Secondary | ICD-10-CM | POA: Insufficient documentation

## 2021-03-11 DIAGNOSIS — Z7982 Long term (current) use of aspirin: Secondary | ICD-10-CM | POA: Insufficient documentation

## 2021-03-11 DIAGNOSIS — M1612 Unilateral primary osteoarthritis, left hip: Secondary | ICD-10-CM | POA: Diagnosis present

## 2021-03-11 DIAGNOSIS — Z96649 Presence of unspecified artificial hip joint: Secondary | ICD-10-CM

## 2021-03-11 DIAGNOSIS — Z23 Encounter for immunization: Secondary | ICD-10-CM | POA: Insufficient documentation

## 2021-03-11 DIAGNOSIS — Z96642 Presence of left artificial hip joint: Secondary | ICD-10-CM

## 2021-03-11 DIAGNOSIS — Z7984 Long term (current) use of oral hypoglycemic drugs: Secondary | ICD-10-CM | POA: Insufficient documentation

## 2021-03-11 DIAGNOSIS — M1712 Unilateral primary osteoarthritis, left knee: Principal | ICD-10-CM | POA: Insufficient documentation

## 2021-03-11 DIAGNOSIS — Z419 Encounter for procedure for purposes other than remedying health state, unspecified: Secondary | ICD-10-CM

## 2021-03-11 DIAGNOSIS — Z79899 Other long term (current) drug therapy: Secondary | ICD-10-CM | POA: Insufficient documentation

## 2021-03-11 HISTORY — PX: TOTAL HIP ARTHROPLASTY: SHX124

## 2021-03-11 LAB — GLUCOSE, CAPILLARY
Glucose-Capillary: 177 mg/dL — ABNORMAL HIGH (ref 70–99)
Glucose-Capillary: 134 mg/dL — ABNORMAL HIGH (ref 70–99)
Glucose-Capillary: 164 mg/dL — ABNORMAL HIGH (ref 70–99)
Glucose-Capillary: 244 mg/dL — ABNORMAL HIGH (ref 70–99)

## 2021-03-11 LAB — ABO/RH: ABO/RH(D): A POS

## 2021-03-11 SURGERY — ARTHROPLASTY, HIP, TOTAL, ANTERIOR APPROACH
Anesthesia: Spinal | Site: Hip | Laterality: Left

## 2021-03-11 MED ORDER — METOCLOPRAMIDE HCL 5 MG PO TABS: 5.0000 mg | ORAL_TABLET | Freq: Three times a day (TID) | ORAL | Status: DC | PRN

## 2021-03-11 MED ORDER — LISINOPRIL 5 MG PO TABS
5.0000 mg | ORAL_TABLET | Freq: Every day | ORAL | Status: DC
  Administered 2021-03-11 – 2021-03-12 (×2): 5 mg via ORAL
  Filled 2021-03-11 (×2): qty 1

## 2021-03-11 MED ORDER — ACETAMINOPHEN 325 MG PO TABS: 325.0000 mg | ORAL_TABLET | Freq: Four times a day (QID) | ORAL | Status: DC | PRN

## 2021-03-11 MED ORDER — MIDAZOLAM HCL 2 MG/2ML IJ SOLN
INTRAMUSCULAR | Status: AC
  Filled 2021-03-11: qty 2

## 2021-03-11 MED ORDER — ONDANSETRON HCL 4 MG/2ML IJ SOLN: 4.0000 mg | Freq: Four times a day (QID) | INTRAMUSCULAR | Status: DC | PRN

## 2021-03-11 MED ORDER — ACETAMINOPHEN 500 MG PO TABS
1000.0000 mg | ORAL_TABLET | Freq: Four times a day (QID) | ORAL | Status: AC
  Administered 2021-03-11 – 2021-03-12 (×4): 1000 mg via ORAL
  Filled 2021-03-11 (×4): qty 2

## 2021-03-11 MED ORDER — SODIUM CHLORIDE 0.9 % IR SOLN
Status: DC | PRN
  Administered 2021-03-11: 3000 mL

## 2021-03-11 MED ORDER — LACTATED RINGERS IV SOLN: INTRAVENOUS | Status: DC

## 2021-03-11 MED ORDER — MAGNESIUM CITRATE PO SOLN: 1.0000 | Freq: Once | ORAL | Status: DC | PRN

## 2021-03-11 MED ORDER — POLYETHYLENE GLYCOL 3350 17 G PO PACK
17.0000 g | PACK | Freq: Every day | ORAL | Status: DC
  Administered 2021-03-11 – 2021-03-12 (×2): 17 g via ORAL
  Filled 2021-03-11 (×2): qty 1

## 2021-03-11 MED ORDER — TRANEXAMIC ACID 1000 MG/10ML IV SOLN
INTRAVENOUS | Status: DC | PRN
  Administered 2021-03-11: 2000 mg via TOPICAL

## 2021-03-11 MED ORDER — ONDANSETRON HCL 4 MG PO TABS: 4.0000 mg | ORAL_TABLET | Freq: Four times a day (QID) | ORAL | Status: DC | PRN

## 2021-03-11 MED ORDER — OXYCODONE HCL 5 MG PO TABS: 10.0000 mg | ORAL_TABLET | ORAL | Status: DC | PRN

## 2021-03-11 MED ORDER — CEFAZOLIN SODIUM-DEXTROSE 2-4 GM/100ML-% IV SOLN
2.0000 g | INTRAVENOUS | Status: AC
  Administered 2021-03-11: 2 g via INTRAVENOUS
  Filled 2021-03-11: qty 100

## 2021-03-11 MED ORDER — POVIDONE-IODINE 10 % EX SWAB
2.0000 "application " | Freq: Once | CUTANEOUS | Status: AC
  Administered 2021-03-11: 2 via TOPICAL

## 2021-03-11 MED ORDER — BUPIVACAINE-MELOXICAM ER 400-12 MG/14ML IJ SOLN
INTRAMUSCULAR | Status: DC | PRN
  Administered 2021-03-11 (×2): 200 mg

## 2021-03-11 MED ORDER — PHENYLEPHRINE HCL-NACL 10-0.9 MG/250ML-% IV SOLN
INTRAVENOUS | Status: DC | PRN
  Administered 2021-03-11: 10 ug/min via INTRAVENOUS

## 2021-03-11 MED ORDER — METHOCARBAMOL 1000 MG/10ML IJ SOLN
500.0000 mg | Freq: Four times a day (QID) | INTRAVENOUS | Status: DC | PRN
  Filled 2021-03-11: qty 5

## 2021-03-11 MED ORDER — TRANEXAMIC ACID-NACL 1000-0.7 MG/100ML-% IV SOLN
1000.0000 mg | Freq: Once | INTRAVENOUS | Status: AC
  Administered 2021-03-11: 1000 mg via INTRAVENOUS
  Filled 2021-03-11: qty 100

## 2021-03-11 MED ORDER — FENTANYL CITRATE (PF) 100 MCG/2ML IJ SOLN
INTRAMUSCULAR | Status: AC
  Filled 2021-03-11: qty 2

## 2021-03-11 MED ORDER — SODIUM CHLORIDE 0.9 % IV SOLN: INTRAVENOUS | Status: DC

## 2021-03-11 MED ORDER — FENTANYL CITRATE (PF) 250 MCG/5ML IJ SOLN
INTRAMUSCULAR | Status: AC
  Filled 2021-03-11: qty 5

## 2021-03-11 MED ORDER — OXYCODONE HCL 5 MG/5ML PO SOLN: 5.0000 mg | Freq: Once | ORAL | Status: DC | PRN

## 2021-03-11 MED ORDER — IRRISEPT - 450ML BOTTLE WITH 0.05% CHG IN STERILE WATER, USP 99.95% OPTIME
TOPICAL | Status: DC | PRN
  Administered 2021-03-11: 450 mL via TOPICAL

## 2021-03-11 MED ORDER — FENTANYL CITRATE (PF) 250 MCG/5ML IJ SOLN
INTRAMUSCULAR | Status: DC | PRN
  Administered 2021-03-11: 50 ug via INTRAVENOUS

## 2021-03-11 MED ORDER — METHOCARBAMOL 500 MG PO TABS: 500.0000 mg | ORAL_TABLET | Freq: Four times a day (QID) | ORAL | Status: DC | PRN

## 2021-03-11 MED ORDER — STERILE WATER FOR IRRIGATION IR SOLN
Status: DC | PRN
  Administered 2021-03-11: 1000 mL

## 2021-03-11 MED ORDER — ORAL CARE MOUTH RINSE: 15.0000 mL | Freq: Once | OROMUCOSAL | Status: AC

## 2021-03-11 MED ORDER — PHENOL 1.4 % MT LIQD: 1.0000 | OROMUCOSAL | Status: DC | PRN

## 2021-03-11 MED ORDER — PHENYLEPHRINE HCL (PRESSORS) 10 MG/ML IV SOLN
INTRAVENOUS | Status: DC | PRN
  Administered 2021-03-11: 80 ug via INTRAVENOUS

## 2021-03-11 MED ORDER — ONDANSETRON HCL 4 MG/2ML IJ SOLN
INTRAMUSCULAR | Status: DC | PRN
  Administered 2021-03-11: 4 mg via INTRAVENOUS

## 2021-03-11 MED ORDER — CHLORHEXIDINE GLUCONATE 0.12 % MT SOLN
OROMUCOSAL | Status: AC
  Administered 2021-03-11: 15 mL via OROMUCOSAL
  Filled 2021-03-11: qty 15

## 2021-03-11 MED ORDER — MIDAZOLAM HCL 5 MG/5ML IJ SOLN
INTRAMUSCULAR | Status: DC | PRN
  Administered 2021-03-11: 1 mg via INTRAVENOUS

## 2021-03-11 MED ORDER — CHLORHEXIDINE GLUCONATE 0.12 % MT SOLN: 15.0000 mL | Freq: Once | OROMUCOSAL | Status: AC

## 2021-03-11 MED ORDER — GLIPIZIDE 5 MG PO TABS
10.0000 mg | ORAL_TABLET | Freq: Two times a day (BID) | ORAL | Status: DC
  Administered 2021-03-11 – 2021-03-12 (×3): 10 mg via ORAL
  Filled 2021-03-11 (×3): qty 2

## 2021-03-11 MED ORDER — INSULIN ASPART 100 UNIT/ML ~~LOC~~ SOLN
0.0000 [IU] | Freq: Every day | SUBCUTANEOUS | Status: DC
  Administered 2021-03-11: 2 [IU] via SUBCUTANEOUS

## 2021-03-11 MED ORDER — BUPIVACAINE IN DEXTROSE 0.75-8.25 % IT SOLN
INTRATHECAL | Status: DC | PRN
  Administered 2021-03-11: 1.5 mL via INTRATHECAL

## 2021-03-11 MED ORDER — DIPHENHYDRAMINE HCL 12.5 MG/5ML PO ELIX: 25.0000 mg | ORAL_SOLUTION | ORAL | Status: DC | PRN

## 2021-03-11 MED ORDER — LINAGLIPTIN 5 MG PO TABS
5.0000 mg | ORAL_TABLET | Freq: Every day | ORAL | Status: DC
  Administered 2021-03-11 – 2021-03-12 (×2): 5 mg via ORAL
  Filled 2021-03-11 (×2): qty 1

## 2021-03-11 MED ORDER — PROPOFOL 500 MG/50ML IV EMUL
INTRAVENOUS | Status: DC | PRN
  Administered 2021-03-11: 40 ug/kg/min via INTRAVENOUS

## 2021-03-11 MED ORDER — BUPIVACAINE-MELOXICAM ER 200-6 MG/7ML IJ SOLN
INTRAMUSCULAR | Status: AC
  Filled 2021-03-11: qty 2

## 2021-03-11 MED ORDER — VANCOMYCIN HCL 1 G IV SOLR
INTRAVENOUS | Status: DC | PRN
  Administered 2021-03-11: 1000 mg via TOPICAL

## 2021-03-11 MED ORDER — ACETAMINOPHEN 500 MG PO TABS
1000.0000 mg | ORAL_TABLET | Freq: Once | ORAL | Status: AC
  Administered 2021-03-11: 1000 mg via ORAL
  Filled 2021-03-11: qty 2

## 2021-03-11 MED ORDER — DOCUSATE SODIUM 100 MG PO CAPS
100.0000 mg | ORAL_CAPSULE | Freq: Two times a day (BID) | ORAL | Status: DC
  Administered 2021-03-11 – 2021-03-12 (×3): 100 mg via ORAL
  Filled 2021-03-11 (×3): qty 1

## 2021-03-11 MED ORDER — 0.9 % SODIUM CHLORIDE (POUR BTL) OPTIME
TOPICAL | Status: DC | PRN
  Administered 2021-03-11: 1000 mL

## 2021-03-11 MED ORDER — OXYCODONE HCL 5 MG PO TABS
5.0000 mg | ORAL_TABLET | ORAL | Status: DC | PRN
  Administered 2021-03-11: 5 mg via ORAL
  Filled 2021-03-11: qty 1

## 2021-03-11 MED ORDER — VANCOMYCIN HCL 1000 MG IV SOLR
INTRAVENOUS | Status: AC
  Filled 2021-03-11: qty 1000

## 2021-03-11 MED ORDER — HYDROMORPHONE HCL 1 MG/ML IJ SOLN
0.5000 mg | INTRAMUSCULAR | Status: DC | PRN
Start: 2021-03-11 — End: 2021-03-13

## 2021-03-11 MED ORDER — ALUM & MAG HYDROXIDE-SIMETH 200-200-20 MG/5ML PO SUSP: 30.0000 mL | ORAL | Status: DC | PRN

## 2021-03-11 MED ORDER — OXYCODONE HCL 5 MG PO TABS
5.0000 mg | ORAL_TABLET | Freq: Once | ORAL | Status: DC | PRN
Start: 2021-03-11 — End: 2021-03-11

## 2021-03-11 MED ORDER — MENTHOL 3 MG MT LOZG: 1.0000 | LOZENGE | OROMUCOSAL | Status: DC | PRN

## 2021-03-11 MED ORDER — FENTANYL CITRATE (PF) 100 MCG/2ML IJ SOLN
25.0000 ug | INTRAMUSCULAR | Status: DC | PRN
  Administered 2021-03-11: 50 ug via INTRAVENOUS

## 2021-03-11 MED ORDER — INSULIN ASPART 100 UNIT/ML ~~LOC~~ SOLN
0.0000 [IU] | Freq: Three times a day (TID) | SUBCUTANEOUS | Status: DC
  Administered 2021-03-11 – 2021-03-12 (×2): 3 [IU] via SUBCUTANEOUS
  Administered 2021-03-12 (×2): 15 [IU] via SUBCUTANEOUS

## 2021-03-11 MED ORDER — ASPIRIN 81 MG PO CHEW
81.0000 mg | CHEWABLE_TABLET | Freq: Two times a day (BID) | ORAL | Status: DC
  Administered 2021-03-11 – 2021-03-12 (×2): 81 mg via ORAL
  Filled 2021-03-11 (×2): qty 1

## 2021-03-11 MED ORDER — CEFAZOLIN SODIUM-DEXTROSE 2-4 GM/100ML-% IV SOLN
2.0000 g | Freq: Four times a day (QID) | INTRAVENOUS | Status: AC
  Administered 2021-03-11 (×2): 2 g via INTRAVENOUS
  Filled 2021-03-11 (×2): qty 100

## 2021-03-11 MED ORDER — METOCLOPRAMIDE HCL 5 MG/ML IJ SOLN
5.0000 mg | Freq: Three times a day (TID) | INTRAMUSCULAR | Status: DC | PRN
Start: 2021-03-11 — End: 2021-03-13

## 2021-03-11 MED ORDER — PROMETHAZINE HCL 25 MG/ML IJ SOLN: 6.2500 mg | INTRAMUSCULAR | Status: DC | PRN

## 2021-03-11 MED ORDER — DEXAMETHASONE SODIUM PHOSPHATE 10 MG/ML IJ SOLN
10.0000 mg | Freq: Once | INTRAMUSCULAR | Status: AC
  Administered 2021-03-12: 10 mg via INTRAVENOUS
  Filled 2021-03-11: qty 1

## 2021-03-11 MED ORDER — SORBITOL 70 % SOLN
30.0000 mL | Freq: Every day | Status: DC | PRN
  Filled 2021-03-11: qty 30

## 2021-03-11 SURGICAL SUPPLY — 59 items
ACETAB CUP W/GRIPTION 54 (Plate) ×2 IMPLANT
BAG DECANTER FOR FLEXI CONT (MISCELLANEOUS) ×2 IMPLANT
CELLS DAT CNTRL 66122 CELL SVR (MISCELLANEOUS) IMPLANT
COVER PERINEAL POST (MISCELLANEOUS) ×2 IMPLANT
COVER SURGICAL LIGHT HANDLE (MISCELLANEOUS) ×2 IMPLANT
COVER WAND RF STERILE (DRAPES) ×2 IMPLANT
CUP ACETAB W/GRIPTION 54 (Plate) ×1 IMPLANT
DRAPE C-ARM 42X72 X-RAY (DRAPES) ×2 IMPLANT
DRAPE POUCH INSTRU U-SHP 10X18 (DRAPES) ×2 IMPLANT
DRAPE STERI IOBAN 125X83 (DRAPES) ×2 IMPLANT
DRAPE U-SHAPE 47X51 STRL (DRAPES) ×4 IMPLANT
DRSG AQUACEL AG ADV 3.5X10 (GAUZE/BANDAGES/DRESSINGS) ×2 IMPLANT
DURAPREP 26ML APPLICATOR (WOUND CARE) ×4 IMPLANT
ELECT BLADE 4.0 EZ CLEAN MEGAD (MISCELLANEOUS) ×2
ELECT REM PT RETURN 9FT ADLT (ELECTROSURGICAL) ×2
ELECTRODE BLDE 4.0 EZ CLN MEGD (MISCELLANEOUS) ×1 IMPLANT
ELECTRODE REM PT RTRN 9FT ADLT (ELECTROSURGICAL) ×1 IMPLANT
GLOVE ECLIPSE 7.0 STRL STRAW (GLOVE) ×4 IMPLANT
GLOVE SKINSENSE NS SZ7.5 (GLOVE) ×1
GLOVE SKINSENSE STRL SZ7.5 (GLOVE) ×1 IMPLANT
GLOVE SURG SYN 7.5  E (GLOVE) ×4
GLOVE SURG SYN 7.5 E (GLOVE) ×4 IMPLANT
GLOVE SURG UNDER POLY LF SZ7 (GLOVE) ×2 IMPLANT
GOWN STRL REIN XL XLG (GOWN DISPOSABLE) ×2 IMPLANT
GOWN STRL REUS W/ TWL LRG LVL3 (GOWN DISPOSABLE) IMPLANT
GOWN STRL REUS W/ TWL XL LVL3 (GOWN DISPOSABLE) ×1 IMPLANT
GOWN STRL REUS W/TWL LRG LVL3 (GOWN DISPOSABLE)
GOWN STRL REUS W/TWL XL LVL3 (GOWN DISPOSABLE) ×1
HANDPIECE INTERPULSE COAX TIP (DISPOSABLE) ×1
HEAD CERAMIC 36 PLUS5 (Hips) ×2 IMPLANT
HOOD PEEL AWAY FLYTE STAYCOOL (MISCELLANEOUS) ×4 IMPLANT
IV NS IRRIG 3000ML ARTHROMATIC (IV SOLUTION) ×2 IMPLANT
JET LAVAGE IRRISEPT WOUND (IRRIGATION / IRRIGATOR) ×2
KIT BASIN OR (CUSTOM PROCEDURE TRAY) ×2 IMPLANT
LAVAGE JET IRRISEPT WOUND (IRRIGATION / IRRIGATOR) ×1 IMPLANT
LINER NEUTRAL 54X36MM PLUS 4 (Hips) ×2 IMPLANT
MARKER SKIN DUAL TIP RULER LAB (MISCELLANEOUS) ×2 IMPLANT
NEEDLE SPNL 18GX3.5 QUINCKE PK (NEEDLE) ×2 IMPLANT
PACK TOTAL JOINT (CUSTOM PROCEDURE TRAY) ×2 IMPLANT
PACK UNIVERSAL I (CUSTOM PROCEDURE TRAY) ×2 IMPLANT
RTRCTR WOUND ALEXIS 18CM MED (MISCELLANEOUS)
SAW OSC TIP CART 19.5X105X1.3 (SAW) ×2 IMPLANT
SCREW 6.5MMX25MM (Screw) ×2 IMPLANT
SET HNDPC FAN SPRY TIP SCT (DISPOSABLE) ×1 IMPLANT
STAPLER VISISTAT 35W (STAPLE) IMPLANT
STEM FEM ACTIS HIGH SZ3 (Stem) ×2 IMPLANT
SUT ETHIBOND 2 V 37 (SUTURE) ×2 IMPLANT
SUT VIC AB 0 CT1 27 (SUTURE) ×1
SUT VIC AB 0 CT1 27XBRD ANBCTR (SUTURE) ×1 IMPLANT
SUT VIC AB 1 CTX 36 (SUTURE) ×1
SUT VIC AB 1 CTX36XBRD ANBCTR (SUTURE) ×1 IMPLANT
SUT VIC AB 2-0 CT1 27 (SUTURE) ×2
SUT VIC AB 2-0 CT1 TAPERPNT 27 (SUTURE) ×2 IMPLANT
SYR 50ML LL SCALE MARK (SYRINGE) ×2 IMPLANT
TOWEL GREEN STERILE (TOWEL DISPOSABLE) ×2 IMPLANT
TRAY CATH 16FR W/PLASTIC CATH (SET/KITS/TRAYS/PACK) IMPLANT
TRAY FOLEY W/BAG SLVR 16FR (SET/KITS/TRAYS/PACK) ×1
TRAY FOLEY W/BAG SLVR 16FR ST (SET/KITS/TRAYS/PACK) ×1 IMPLANT
YANKAUER SUCT BULB TIP NO VENT (SUCTIONS) ×2 IMPLANT

## 2021-03-11 NOTE — Progress Notes (Signed)
Patient admitted to room, alert and oriented x4. Skin dry and warm to touch. No skin problem noted. Respiration even and non labor. C/o of pain and pain med given for pain, effective.

## 2021-03-11 NOTE — Progress Notes (Signed)
PT Cancellation Note  Patient Details Name: Cheryl Massey MRN: [id number] DOB: [birth year]   Cancelled Treatment:    Reason Eval/Treat Not Completed: Pain limiting ability to participate Per RN, pt in too much pain to participate. Will hold until pt medically appropriate.   Lou Miner, DPT  Acute Rehabilitation Services  Pager: [phone] Office: [phone]  Rudean Hitt 03/11/2021, 3:43 PM

## 2021-03-11 NOTE — Op Note (Signed)
LEFT TOTAL HIP ARTHROPLASTY ANTERIOR APPROACH  Procedure Note Kesha Hurrell [name]   [id number]  Pre-op Diagnosis: left hip osteoarthritis     Post-op Diagnosis: same   Operative Procedures  1. Total hip replacement; Left hip; uncemented [id number]   Surgeon: Frankey Shown, M.D.  Assist: Madalyn Rob, PA-C   Anesthesia: spinal  Prosthesis: Depuy Acetabulum: Pinnacle 54 mm Femur: Actis 3 HO Head: 36 mm size: +5 Liner: +4 Bearing Type: ceramic on poly  Total Hip Arthroplasty (Anterior Approach) Op Note:  After informed consent was obtained and the operative extremity marked in the holding area, the patient was brought back to the operating room and placed supine on the HANA table. Next, the operative extremity was prepped and draped in normal sterile fashion. Surgical timeout occurred verifying patient identification, surgical site, surgical procedure and administration of antibiotics.  A modified anterior Smith-Peterson approach to the hip was performed, using the interval between tensor fascia lata and sartorius.  Dissection was carried bluntly down onto the anterior hip capsule. The lateral femoral circumflex vessels were identified and coagulated. A capsulotomy was performed and the capsular flaps tagged for later repair.  The neck osteotomy was performed. The femoral head was removed, the acetabular rim was cleared of soft tissue and attention was turned to reaming the acetabulum.  Sequential reaming was performed under fluoroscopic guidance. We reamed to a size 53 mm, and then impacted the acetabular shell. A 25 mm cancellous screw was placed through the shell for added fixation.  The liner was then placed after irrigation and attention turned to the femur.  After placing the femoral hook, the leg was taken to externally rotated, extended and adducted position taking care to perform soft tissue releases to allow for adequate mobilization of the femur. Soft tissue was  cleared from the shoulder of the greater trochanter and the hook elevator used to improve exposure of the proximal femur. Sequential broaching performed up to a size 3. Trial neck and head were placed. The leg was brought back up to neutral and the construct reduced.  Antibiotic irrigation was placed in the surgical wound and kept for at least 1 minute.  The position and sizing of components, offset and leg lengths were checked using fluoroscopy. Stability of the construct was checked in extension and external rotation without any subluxation or impingement of prosthesis. We dislocated the prosthesis, dropped the leg back into position, removed trial components, and irrigated copiously. The final stem and head was then placed, the leg brought back up, the system reduced and fluoroscopy used to verify positioning.  We irrigated, obtained hemostasis and closed the capsule using #2 ethibond suture.  One gram of vancomycin powder was placed in the surgical bed. A dilute solution of 20 cc of normal saline, 1.3% exparel, 0.25% bupivacaine was injected in the soft tissues.  One gram of topical tranexamic acid was injected into the joint.  The fascia was closed with #1 vicryl plus, the deep fat layer was closed with 0 vicryl, the subcutaneous layers closed with 2.0 Vicryl Plus and the skin closed with 2.0 nylon and dermabond. A sterile dressing was applied. The patient was awakened in the operating room and taken to recovery in stable condition.  All sponge, needle, and instrument counts were correct at the end of the case.   Tawanna Cooler, my PA, was a medical necessity for opening, closing, limb positioning, retracting, exposing, and overall facilitation and timely completion of the surgery.  Position: supine  Complications: see description of procedure.  Time Out: performed   Drains/Packing: none  Estimated blood loss: see anesthesia record  Returned to Recovery Room: in good condition.   Antibiotics:  yes   Mechanical VTE (DVT) Prophylaxis: sequential compression devices, TED thigh-high  Chemical VTE (DVT) Prophylaxis: aspirin   Fluid Replacement: see anesthesia record  Specimens Removed: 1 to pathology   Sponge and Instrument Count Correct? yes   PACU: portable radiograph - low AP   Plan/RTC: Return in 2 weeks for staple removal. Weight Bearing/Load Lower Extremity: full  Hip precautions: none Suture Removal: 2 weeks   N. Eduard Roux, MD Marga Hoots 12:01 PM   Implant Name Type Inv. Item Serial No. Manufacturer Lot No. LRB No. Used Action  ACETAB CUP Daisy Blossom 54MM - [id number] Plate ACETAB CUP W GRIPTION 54MM  [employer] [id number] Left 1 Implanted  SCREW 6.5MMX25MM - [id number] Screw SCREW 6.5MMX25MM  [employer] [id number] Left 1 Implanted  LINER NEUTRAL 54X36MM PLUS 4 - [id number] Hips LINER NEUTRAL 54X36MM PLUS 4  [employer] [id number] Left 1 Implanted  STEM FEM ACTIS HIGH SZ3 - [id number] Stem STEM FEM ACTIS HIGH SZ3  DEPUY OR[employer] [id number] Left 1 Implanted  HEAD CERAMIC 36 PLUS5 - [id number] Hips HEAD CERAMIC 36 PLUS5  [employer] [id number] Left 1 Implanted

## 2021-03-11 NOTE — H&P (Signed)
PREOPERATIVE H&P  Chief Complaint: left hip osteoarthritis  HPI: Cheryl Massey is a 65 y.o. female who presents for surgical treatment of left hip osteoarthritis.  She denies any changes in medical history.  Past Medical History:  Diagnosis Date  . Arthritis    hands  . Diabetes mellitus without complication ([place])   . Hypertension   . Stroke [facility]) 03/28/2019   Past Surgical History:  Procedure Laterality Date  . ABDOMINAL HYSTERECTOMY    . APPENDECTOMY     Social History   Socioeconomic History  . Marital status: Married    Spouse name: Not on file  . Number of children: Not on file  . Years of education: Not on file  . Highest education level: Not on file  Occupational History  . Not on file  Tobacco Use  . Smoking status: Never Smoker  . Smokeless tobacco: Never Used  Substance and Sexual Activity  . Alcohol use: No  . Drug use: No  . Sexual activity: Yes    Birth control/protection: Surgical  Other Topics Concern  . Not on file  Social History Narrative  . Not on file   Social Determinants of Health   Financial Resource Strain: Not on file  Food Insecurity: Not on file  Transportation Needs: Not on file  Physical Activity: Not on file  Stress: Not on file  Social Connections: Not on file   Family History  Problem Relation Age of Onset  . Hypertension Mother   . Hypertension Father    No Known Allergies Prior to Admission medications   Medication Sig Start Date End Date Taking? Authorizing Provider  aspirin 81 MG EC tablet Take 1 tablet (81 mg total) by mouth daily. 11/02/19  Yes Elsie Stain, MD  aspirin EC 81 MG tablet Take 1 tablet (81 mg total) by mouth 2 (two) times daily. To be taken after surgery 03/07/21  Yes Aundra Dubin, PA-C  docusate sodium (COLACE) 100 MG capsule Take 1 capsule (100 mg total) by mouth daily as needed. 03/07/21 03/07/22 Yes Aundra Dubin, PA-C  glipiZIDE (GLUCOTROL) 10 MG tablet Take 1 tablet  (10 mg total) by mouth 2 (two) times daily before a meal. 01/02/21  Yes Kerin Perna, NP  lisinopril (ZESTRIL) 5 MG tablet Take 1 tablet (5 mg total) by mouth daily. 01/02/21  Yes Kerin Perna, NP  metFORMIN (GLUCOPHAGE) 500 MG tablet Take 500 mg by mouth 2 (two) times daily.   Yes [provider]  oxyCODONE-acetaminophen (PERCOCET) 5-325 MG tablet Take 1-2 tablets by mouth every 6 (six) hours as needed. To be taken after surgery 03/07/21  Yes Dwana Melena L, PA-C  sitaGLIPtin (JANUVIA) 100 MG tablet Take 1 tablet (100 mg total) by mouth daily. 01/02/21  Yes Kerin Perna, NP  atorvastatin (LIPITOR) 20 MG tablet Take 1 tablet (20 mg total) by mouth daily. 01/02/21   Kerin Perna, NP  metFORMIN (GLUCOPHAGE) 1000 MG tablet Take 1 tablet (1,000 mg total) by mouth 2 (two) times daily with a meal. Patient not taking: No sig reported 01/02/21   Kerin Perna, NP  methocarbamol (ROBAXIN) 500 MG tablet Take 1 tablet (500 mg total) by mouth 2 (two) times daily as needed. To be taken after surgery 03/07/21   Aundra Dubin, PA-C  ondansetron (ZOFRAN) 4 MG tablet Take 1 tablet (4 mg total) by mouth every 8 (eight) hours as needed for nausea or vomiting. 03/07/21  Aundra Dubin, PA-C  sulfamethoxazole-trimethoprim (BACTRIM DS) 800-160 MG tablet Take 1 tablet by mouth 2 (two) times daily. To be taken after surgery 03/07/21   Aundra Dubin, PA-C  topiramate (TOPAMAX) 25 MG tablet Take 1 tablet (25 mg total) by mouth every 3 (three) days. Patient not taking: No sig reported 11/02/19   Elsie Stain, MD     Positive ROS: All other systems have been reviewed and were otherwise negative with the exception of those mentioned in the HPI and as above.  Physical Exam: General: Alert, no acute distress Cardiovascular: No pedal edema Respiratory: No cyanosis, no use of accessory musculature GI: abdomen soft Skin: No lesions in the area of chief complaint Neurologic:  Sensation intact distally Psychiatric: Patient is competent for consent with normal mood and affect Lymphatic: no lymphedema  MUSCULOSKELETAL: exam stable  Assessment: left hip osteoarthritis  Plan: Plan for Procedure(s): LEFT TOTAL HIP ARTHROPLASTY ANTERIOR APPROACH  The risks benefits and alternatives were discussed with the patient including but not limited to the risks of nonoperative treatment, versus surgical intervention including infection, bleeding, nerve injury,  blood clots, cardiopulmonary complications, morbidity, mortality, among others, and they were willing to proceed.   Preoperative templating of the joint replacement has been completed, documented, and submitted to the Operating Room personnel in order to optimize intra-operative equipment management.   Eduard Roux, MD 03/11/2021 9:23 AM

## 2021-03-11 NOTE — Anesthesia Procedure Notes (Signed)
Spinal  Patient location during procedure: OR Start time: 03/11/2021 10:21 AM End time: 03/11/2021 10:23 AM Reason for block: surgical anesthesia Staffing Performed: anesthesiologist  Anesthesiologist: Brennan Bailey, MD Preanesthetic Checklist Completed: patient identified, IV checked, risks and benefits discussed, surgical consent, monitors and equipment checked, pre-op evaluation and timeout performed Spinal Block Patient position: sitting Prep: DuraPrep and site prepped and draped Patient monitoring: continuous pulse ox, blood pressure and heart rate Approach: midline Location: L3-4 Injection technique: single-shot Needle Needle type: Pencan  Needle gauge: 24 G Needle length: 9 cm Assessment Events: CSF return Additional Notes Risks, benefits, and alternative discussed. Patient gave consent to procedure. Prepped and draped in sitting position. Patient sedated but responsive to voice. Clear CSF obtained after one needle pass. Positive terminal aspiration. No pain or paraesthesias with injection. Patient tolerated procedure well. Vital signs stable. Tawny Asal, MD

## 2021-03-11 NOTE — Plan of Care (Signed)
?  Problem: Activity: ?Goal: Risk for activity intolerance will decrease ?Outcome: Progressing ?  ?Problem: Safety: ?Goal: Ability to remain free from injury will improve ?Outcome: Progressing ?  ?Problem: Pain Managment: ?Goal: General experience of comfort will improve ?Outcome: Progressing ?  ?

## 2021-03-11 NOTE — Transfer of Care (Signed)
Immediate Anesthesia Transfer of Care Note  Patient: [facility] [name]  Procedure(s) Performed: LEFT TOTAL HIP ARTHROPLASTY ANTERIOR APPROACH (Left Hip)  Patient Location: PACU  Anesthesia Type:MAC  Level of Consciousness: awake, alert , patient cooperative and responds to stimulation  Airway & Oxygen Therapy: Patient Spontanous Breathing and Patient connected to face mask oxygen  Post-op Assessment: Report given to RN and Post -op Vital signs reviewed and stable  Post vital signs: Reviewed and stable  Last Vitals:  Vitals Value Taken Time  BP 101/68 03/11/21 1220  Temp    Pulse 58 03/11/21 1221  Resp 11 03/11/21 1221  SpO2 100 % 03/11/21 1221  Vitals shown include unvalidated device data.  Last Pain:  Vitals:   03/11/21 0841  TempSrc:   PainSc: 10-Worst pain ever      Patients Stated Pain Goal: 3 (64/29/03 7955)  Complications: No complications documented.

## 2021-03-11 NOTE — Anesthesia Procedure Notes (Signed)
Procedure Name: MAC Date/Time: 03/11/2021 10:15 AM Performed by: Glynda Jaeger, CRNA Pre-anesthesia Checklist: Patient identified, Emergency Drugs available, Suction available, Patient being monitored and Timeout performed Patient Re-evaluated:Patient Re-evaluated prior to induction Oxygen Delivery Method: Simple face mask Preoxygenation: Pre-oxygenation with 100% oxygen Induction Type: IV induction Placement Confirmation: positive ETCO2

## 2021-03-11 NOTE — Anesthesia Postprocedure Evaluation (Signed)
Anesthesia Post Note  Patient: Cheryl Massey  Procedure(s) Performed: LEFT TOTAL HIP ARTHROPLASTY ANTERIOR APPROACH (Left Hip)     Patient location during evaluation: PACU Anesthesia Type: Spinal Level of consciousness: awake and alert and oriented Pain management: pain level controlled Vital Signs Assessment: post-procedure vital signs reviewed and stable Respiratory status: spontaneous breathing, nonlabored ventilation and respiratory function stable Cardiovascular status: blood pressure returned to baseline Postop Assessment: no apparent nausea or vomiting, spinal receding, no headache and no backache Anesthetic complications: no   No complications documented.  Last Vitals:  Vitals:   03/11/21 1320 03/11/21 1335  BP: 129/65 111/62  Pulse: (!) 51 (!) 57  Resp: 12 19  Temp:    SpO2: 100% 99%    Last Pain:  Vitals:   03/11/21 1320  TempSrc:   PainSc: 0-No pain                 Brennan Bailey

## 2021-03-12 ENCOUNTER — Other Ambulatory Visit: Payer: Self-pay | Admitting: Physician Assistant

## 2021-03-12 ENCOUNTER — Encounter (HOSPITAL_COMMUNITY): Payer: Self-pay | Admitting: Orthopaedic Surgery

## 2021-03-12 LAB — GLUCOSE, CAPILLARY
Glucose-Capillary: 140 mg/dL — ABNORMAL HIGH (ref 70–99)
Glucose-Capillary: 163 mg/dL — ABNORMAL HIGH (ref 70–99)
Glucose-Capillary: 423 mg/dL — ABNORMAL HIGH (ref 70–99)
Glucose-Capillary: 379 mg/dL — ABNORMAL HIGH (ref 70–99)
Glucose-Capillary: 390 mg/dL — ABNORMAL HIGH (ref 70–99)

## 2021-03-12 LAB — BASIC METABOLIC PANEL
Anion gap: 6 (ref 5–15)
BUN: 15 mg/dL (ref 8–23)
CO2: 23 mmol/L (ref 22–32)
Calcium: 8.7 mg/dL — ABNORMAL LOW (ref 8.9–10.3)
Chloride: 104 mmol/L (ref 98–111)
Creatinine, Ser: 0.71 mg/dL (ref 0.44–1.00)
GFR, Estimated: >60 mL/min (ref >60)
Glucose, Bld: 146 mg/dL — ABNORMAL HIGH (ref 70–99)
Potassium: 4.2 mmol/L (ref 3.5–5.1)
Sodium: 133 mmol/L — ABNORMAL LOW (ref 135–145)

## 2021-03-12 LAB — CBC
HCT: 30.1 % — ABNORMAL LOW (ref 36.0–46.0)
Hemoglobin: 10 g/dL — ABNORMAL LOW (ref 12.0–15.0)
MCH: 29 pg (ref 26.0–34.0)
MCHC: 33.2 g/dL (ref 30.0–36.0)
MCV: 87.2 fL (ref 80.0–100.0)
Platelets: 226 10*3/uL (ref 150–400)
RBC: 3.45 MIL/uL — ABNORMAL LOW (ref 3.87–5.11)
RDW: 12.9 % (ref 11.5–15.5)
WBC: 12.4 10*3/uL — ABNORMAL HIGH (ref 4.0–10.5)
nRBC: 0 % (ref 0.0–0.2)

## 2021-03-12 MED ORDER — PNEUMOCOCCAL VAC POLYVALENT 25 MCG/0.5ML IJ INJ
0.5000 mL | INJECTION | INTRAMUSCULAR | Status: AC
  Administered 2021-03-12: 0.5 mL via INTRAMUSCULAR
  Filled 2021-03-12: qty 0.5

## 2021-03-12 NOTE — Discharge Summary (Signed)
Patient ID: Cheryl Massey MRN: [id number] DOB/AGE: [id number] 64 y.o.  Admit date: 03/11/2021 Discharge date: 03/12/2021  Admission Diagnoses:  Principal Problem:   Primary osteoarthritis of left hip Active Problems:   Status post total replacement of left hip   Discharge Diagnoses:  Same  Past Medical History:  Diagnosis Date  . Arthritis    hands  . Diabetes mellitus without complication ([place])   . Hypertension   . Stroke ([place]) 03/28/2019    Surgeries: Procedure(s): LEFT TOTAL HIP ARTHROPLASTY ANTERIOR APPROACH on 03/11/2021   Consultants:   Discharged Condition: Improved  Hospital Course: Cheryl Massey is an 65 y.o. female who was admitted 03/11/2021 for operative treatment ofPrimary osteoarthritis of left hip. Patient has severe unremitting pain that affects sleep, daily activities, and work/hobbies. After pre-op clearance the patient was taken to the operating room on 03/11/2021 and underwent  Procedure(s): LEFT TOTAL HIP ARTHROPLASTY ANTERIOR APPROACH.    Patient was given perioperative antibiotics:  Anti-infectives (From admission, onward)   Start     Dose/Rate Route Frequency Ordered Stop   03/11/21 1630  ceFAZolin (ANCEF) IVPB 2g/100 mL premix        2 g 200 mL/hr over 30 Minutes Intravenous Every 6 hours 03/11/21 1435 03/11/21 2241   03/11/21 1032  vancomycin (VANCOCIN) powder  Status:  Discontinued          As needed 03/11/21 1033 03/11/21 1220   03/11/21 0900  ceFAZolin (ANCEF) IVPB 2g/100 mL premix        2 g 200 mL/hr over 30 Minutes Intravenous On call to O.R. 03/11/21 0753 03/11/21 1054       Patient was given sequential compression devices, early ambulation, and chemoprophylaxis to prevent DVT.  Patient benefited maximally from hospital stay and there were no complications.    Recent vital signs:  Patient Vitals for the past 24 hrs:  BP Temp Temp src Pulse Resp SpO2  03/12/21 0754 95/65 (!) 97.5 F (36.4 C) Oral 80 19  98 %  03/12/21 0442 125/66 98.6 F (37 C) Oral 87 17 100 %  03/12/21 0038 110/68 97.6 F (36.4 C) Oral 81 17 100 %  03/11/21 1951 110/60 98.4 F (36.9 C) Oral 77 16 100 %  03/11/21 1710 137/62 98.7 F (37.1 C) Oral 72 15 98 %  03/11/21 1451 (!) 142/69 98.5 F (36.9 C) Oral 62 16 100 %  03/11/21 1405 (!) 143/62 -- -- (!) 50 12 100 %  03/11/21 1350 (!) 94/58 -- -- (!) 53 13 98 %  03/11/21 1335 111/62 -- -- (!) 57 19 99 %  03/11/21 1320 129/65 -- -- (!) 51 12 100 %  03/11/21 1305 121/71 -- -- (!) 50 14 100 %     Recent laboratory studies:  Recent Labs    03/12/21 0324  WBC 12.4*  HGB 10.0*  HCT 30.1*  PLT 226  NA 133*  K 4.2  CL 104  CO2 23  BUN 15  CREATININE 0.71  GLUCOSE 146*  CALCIUM 8.7*     Discharge Medications:   Allergies as of 03/12/2021   No Known Allergies     Medication List    TAKE these medications   aspirin EC 81 MG tablet Take 1 tablet (81 mg total) by mouth 2 (two) times daily. To be taken after surgery What changed: Another medication with the same name was removed. Continue taking this medication, and follow the directions you see here.   atorvastatin  20 MG tablet Commonly known as: LIPITOR Take 1 tablet (20 mg total) by mouth daily.   docusate sodium 100 MG capsule Commonly known as: Colace Take 1 capsule (100 mg total) by mouth daily as needed.   glipiZIDE 10 MG tablet Commonly known as: GLUCOTROL Take 1 tablet (10 mg total) by mouth 2 (two) times daily before a meal.   lisinopril 5 MG tablet Commonly known as: ZESTRIL Take 1 tablet (5 mg total) by mouth daily.   metFORMIN 500 MG tablet Commonly known as: GLUCOPHAGE Take 500 mg by mouth 2 (two) times daily. What changed: Another medication with the same name was removed. Continue taking this medication, and follow the directions you see here.   methocarbamol 500 MG tablet Commonly known as: Robaxin Take 1 tablet (500 mg total) by mouth 2 (two) times daily as needed. To be taken  after surgery   ondansetron 4 MG tablet Commonly known as: Zofran Take 1 tablet (4 mg total) by mouth every 8 (eight) hours as needed for nausea or vomiting.   oxyCODONE-acetaminophen 5-325 MG tablet Commonly known as: Percocet Take 1-2 tablets by mouth every 6 (six) hours as needed. To be taken after surgery   sitaGLIPtin 100 MG tablet Commonly known as: Januvia Take 1 tablet (100 mg total) by mouth daily.   sulfamethoxazole-trimethoprim 800-160 MG tablet Commonly known as: BACTRIM DS Take 1 tablet by mouth 2 (two) times daily. To be taken after surgery   topiramate 25 MG tablet Commonly known as: TOPAMAX Take 1 tablet (25 mg total) by mouth every 3 (three) days.            Durable Medical Equipment  (From admission, onward)         Start     Ordered   03/11/21 1436  DME Walker rolling  Once       Question:  Patient needs a walker to treat with the following condition  Answer:  History of hip replacement   03/11/21 1435   03/11/21 1436  DME 3 n 1  Once        03/11/21 1435   03/11/21 1436  DME Bedside commode  Once       Question:  Patient needs a bedside commode to treat with the following condition  Answer:  History of hip replacement   03/11/21 1435          Diagnostic Studies: DG Chest 2 View  Result Date: 03/07/2021 CLINICAL DATA:  Preoperative assessment EXAM: CHEST - 2 VIEW COMPARISON:  January 17, 2018 FINDINGS: Lungs are clear. Heart size and pulmonary vascularity are normal. No adenopathy. There is degenerative change in the thoracic spine. IMPRESSION: Lungs clear.  Cardiac silhouette normal. Electronically Signed   By: Lowella Grip III M.D.   On: 03/07/2021 14:48   DG Pelvis Portable  Result Date: 03/11/2021 CLINICAL DATA:  Status post total hip replacement on the left EXAM: PORTABLE PELVIS 1-2 VIEWS COMPARISON:  Intraoperative left hip radiographic images March 11, 2021; pelvic radiograph October 05, 2020 FINDINGS: Frontal pelvis image obtained.  There are total hip replacements bilaterally with prosthetic components well-seated on each side. 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 No fracture or dislocation. Soft tissue air noted on the left. There is mild myositis ossificans on the right laterally. IMPRESSION: Total hip replacement on each side with prosthetic components bilaterally well-seated. Acute postoperative change on the left. 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 No acute fracture or dislocation. Electronically Signed   By: Lowella Grip III M.D.   On: 03/11/2021 14:09   DG C-Arm 1-60 Min  Result Date: 03/11/2021 CLINICAL DATA:  Left hip arthroplasty. EXAM: OPERATIVE LEFT HIP (WITH PELVIS IF PERFORMED) 2 VIEWS TECHNIQUE: Fluoroscopic spot image(s) were submitted for interpretation post-operatively. Fluoro Time is 23 secs Cumulative Air Kerma is 2.5 mGys COMPARISON:  10/05/2020 FINDINGS: Postop change from interval left hip total arthroplasty identified. Hardware components are in anatomic alignment. No periprosthetic fracture or dislocation. Previous right hip arthroplasty device noted. IMPRESSION: Status post left hip total arthroplasty. Electronically Signed   By: Kerby Moors M.D.   On: 03/11/2021 12:21   DG HIP OPERATIVE UNILAT WITH PELVIS LEFT  Result Date: 03/11/2021 CLINICAL DATA:  Left hip arthroplasty. EXAM: OPERATIVE LEFT HIP (WITH PELVIS IF PERFORMED) 2 VIEWS TECHNIQUE: Fluoroscopic spot image(s) were submitted for interpretation post-operatively. Fluoro Time is 23 secs Cumulative Air Kerma is 2.5 mGys COMPARISON:  10/05/2020 FINDINGS: Postop change from interval left hip total arthroplasty identified. Hardware components are in anatomic alignment. No periprosthetic fracture or dislocation. Previous right hip arthroplasty device noted. IMPRESSION: Status post left hip total arthroplasty. Electronically Signed   By: Kerby Moors M.D.    On: 03/11/2021 12:21    Disposition: Discharge disposition: 01-Home or Self Care          Follow-up Information    [facility] Follow up on 03/27/2021.   Specialty: Family Medicine Why: 9:30 am with PCP Juluis Mire NP Contact information: [place] [phone]               Signed: Aundra Dubin 03/12/2021, 12:57 PM

## 2021-03-12 NOTE — Progress Notes (Signed)
Discharge package printed in [place] and  Spanish and instructions given to patient and daughter with in house interpretor. Daughter and pt verbalized understanding and no question for nurse. Pneumovax given to pt. Info on Pneumovax printed in spanish given to pt and daughter.

## 2021-03-12 NOTE — Evaluation (Signed)
Physical Therapy Evaluation Patient Details Name: Cheryl Massey MRN: [id number] DOB: 1956-09-25 Today's Date: 03/12/2021   History of Present Illness  Cheryl Massey Cheryl Massey is a 64 y.o. female who presents for surgical treatment of left hip osteoarthritis who under went L anterior THA on 3/28. PMH: DM, HTN, stroke 03/28/2019 PSH: hysterectomy, appendectomy.    Clinical Impression  Pt admitted for above. Unsure of home set up and support as pt speaks no [place]. Attempted use of google translate as the ipad was broken and in person translator not available however pt with difficulty expressing words/answering questions. In history pt has a stroke in 2020, wondering if she has aphasia. Cheryl Massey, the in person spanish interpreter will be coming at 12 noon for second session with hopes to gather more accurate information. Attempted to call daughter on mobile and at home phone however no answer. Pt did get up with modA and amb 15' with RW with minA. Pt grimacing in pain but stating "poquito" when asked. Plan is to see patient again at [facility] with [place] translator to definitely determine d/c recs.    Follow Up Recommendations Home health PT;Supervision/Assistance - 24 hour    Equipment Recommendations  Rolling walker with 5" wheels    Recommendations for Other Services       Precautions / Restrictions Precautions Precautions: Fall Restrictions Weight Bearing Restrictions: Yes LLE Weight Bearing: Weight bearing as tolerated      Mobility  Bed Mobility Overal bed mobility: Needs Assistance Bed Mobility: Supine to Sit     Supine to sit: Mod assist     General bed mobility comments: max directional verbal cues accompanied with demonstration and google translate. Pt requiring increased time and assist for trunk elevation and L LE management off EOB    Transfers Overall transfer level: Needs assistance Equipment used: Rolling walker (2 wheeled) Transfers: Sit  to/from Stand Sit to Stand: Mod assist         General transfer comment: max verbal cues to push up from bed, modA to steady during transition of hands from bed to RW  Ambulation/Gait Ambulation/Gait assistance: Min assist Gait Distance (Feet): 15 Feet Assistive device: Rolling walker (2 wheeled) Gait Pattern/deviations: Step-to pattern;Decreased stride length;Decreased stance time - left Gait velocity: slow Gait velocity interpretation: <1.31 ft/sec, indicative of household ambulator General Gait Details: pt WBing on L ball of foot, pt grimacing in pain but when asked if she is in pain states "poquito" , max encouragement to continue walking, pt stopping holding onto counter, unable to communicate if it was pain or dizziness, SpO2 95% on RA, HR in 80s  Stairs            Wheelchair Mobility    Modified Rankin (Stroke Patients Only)       Balance Overall balance assessment: Needs assistance         Standing balance support: Bilateral upper extremity supported;During functional activity Standing balance-Leahy Scale: Poor Standing balance comment: requires use of RW for safe standing at this time                             Pertinent Vitals/Pain Pain Assessment: Faces Faces Pain Scale: Hurts whole lot Pain Location: pt grimacing and reaching for Left hip but states "poquito" when asked via google translater if she is in pain Pain Descriptors / Indicators: Grimacing;Guarding Pain Intervention(s): Limited activity within patient's tolerance    Home Living Family/patient expects  to be discharged to:: Private residence Living Arrangements: Spouse/significant other;Children (spouse and dtr) Available Help at Discharge: Family;Available 24 hours/day Type of Home: Apartment Home Access: Stairs to enter Entrance Stairs-Rails: Can reach both;Right;Left Entrance Stairs-Number of Steps: 5 Home Layout: One level Home Equipment: Walker - 2 wheels      Prior  Function Level of Independence: Independent               Hand Dominance   Dominant Hand: Right    Extremity/Trunk Assessment   Upper Extremity Assessment Upper Extremity Assessment: Overall WFL for tasks assessed    Lower Extremity Assessment Lower Extremity Assessment: LLE deficits/detail LLE Deficits / Details: able to complete quad set, glut set and LAQ    Cervical / Trunk Assessment Cervical / Trunk Assessment: Normal  Communication   Communication: Interpreter utilized Financial trader)  Cognition Arousal/Alertness: Awake/alert Behavior During Therapy: WFL for tasks assessed/performed Overall Cognitive Status: No family/caregiver present to determine baseline cognitive functioning                                 General Comments: pt with delayed response time, despite using google translater pt unable to respond verbally, no comprehension to speak her answers into phone      General Comments General comments (skin integrity, edema, etc.): pt with no drainage from dressing    Exercises Total Joint Exercises Ankle Circles/Pumps: AROM;Both;10 reps Quad Sets: AROM;Both;10 reps;Supine Gluteal Sets: AROM;Both;10 reps;Supine Long Arc Quad: AROM;Left;10 reps;Seated   Assessment/Plan    PT Assessment Patient needs continued PT services  PT Problem List Decreased strength;Decreased range of motion;Decreased activity tolerance;Decreased balance;Decreased mobility;Decreased knowledge of use of DME       PT Treatment Interventions DME instruction;Gait training;Stair training;Functional mobility training;Therapeutic activities;Therapeutic exercise    PT Goals (Current goals can be found in the Care Plan section)  Acute Rehab PT Goals Patient Stated Goal: didn't state PT Goal Formulation: With patient Time For Goal Achievement: 03/25/21 Potential to Achieve Goals: Good    Frequency 7X/week   Barriers to discharge        Co-evaluation                AM-PAC PT "6 Clicks" Mobility  Outcome Measure Help needed turning from your back to your side while in a flat bed without using bedrails?: A Little Help needed moving from lying on your back to sitting on the side of a flat bed without using bedrails?: A Little Help needed moving to and from a bed to a chair (including a wheelchair)?: A Little Help needed standing up from a chair using your arms (e.g., wheelchair or bedside chair)?: A Little Help needed to walk in hospital room?: A Little Help needed climbing 3-5 steps with a railing? : A Lot 6 Click Score: 17    End of Session Equipment Utilized During Treatment: Gait belt Activity Tolerance:  (limited by language barrier and what appears to be pain) Patient left: in chair;with call bell/phone within reach;with chair alarm set;with nursing/sitter in room Nurse Communication: Mobility status PT Visit Diagnosis: Unsteadiness on feet (R26.81);Muscle weakness (generalized) (M62.81);Difficulty in walking, not elsewhere classified (R26.2)    Time: 0715-0800 PT Time Calculation (min) (ACUTE ONLY): 45 min   Charges:   PT Evaluation $PT Eval Moderate Complexity: 1 Mod PT Treatments $Gait Training: 8-22 mins $Therapeutic Exercise: 8-22 mins        Rashied Corallo, PT, DPT Acute  Rehabilitation Services Pager #: [phone] Office #: [phone]   Berline Lopes 03/12/2021, 8:23 AM

## 2021-03-12 NOTE — Progress Notes (Signed)
Report given to [facility] for continuity of care, no further complain was made

## 2021-03-12 NOTE — Progress Notes (Signed)
Physical Therapy Treatment Patient Details Name: Cheryl Massey MRN: [id number] DOB: 1956-07-17 Today's Date: 03/12/2021    History of Present Illness Cheryl Massey is a 65 y.o. female who presents for surgical treatment of left hip osteoarthritis who under went L anterior THA on 3/28. PMH: DM, HTN, stroke 03/28/2019 PSH: hysterectomy, appendectomy.    PT Comments    Pt seen for second treatment with in person translator [place]. Pt more interactive and able to follow commands and engage in conversation. Dtr and spouse also present. Pt demo'd increased ambulation tolerance with RW and ability to safely navigate stairs to enter home. Spouse and dtr present and demo'd good understanding of stair negotiation. Discussed getting in/out of car as well. Pt safe to d/ home with family and HHPT once medical stable and cleared by MD. Acute PT to cont to follow.    Follow Up Recommendations  Home health PT;Supervision/Assistance - 24 hour     Equipment Recommendations  Rolling walker with 5" wheels    Recommendations for Other Services       Precautions / Restrictions Precautions Precautions: Fall Restrictions Weight Bearing Restrictions: Yes LLE Weight Bearing: Weight bearing as tolerated    Mobility  Bed Mobility               General bed mobility comments: pt up in chair    Transfers Overall transfer level: Needs assistance Equipment used: Rolling walker (2 wheeled) Transfers: Sit to/from Stand Sit to Stand: Min guard         General transfer comment: verbal cues for safe hand placement, pt with carry over at end of session, no physical assist needed, increased time  Ambulation/Gait Ambulation/Gait assistance: Min guard Gait Distance (Feet): 60 Feet (x1, 90x1) Assistive device: Rolling walker (2 wheeled) Gait Pattern/deviations: Step-to pattern;Step-through pattern;Decreased stance time - left Gait velocity: slow Gait velocity  interpretation: <1.31 ft/sec, indicative of household ambulator General Gait Details: pt eventually transitioned to step through pattern with max verbal cues and minA to keep RW propelling forward insteady of stopping, pt c/o L LE getting tired   Stairs Stairs: Yes Stairs assistance: Min guard Stair Management: Two rails;Step to pattern Number of Stairs: 6 General stair comments: pt and family educated on step to, up with the good, down with the bad sequencing, both we good return demo/understanding, increased time but able to complete safely   Wheelchair Mobility    Modified Rankin (Stroke Patients Only)       Balance Overall balance assessment: Needs assistance Sitting-balance support: Feet supported;No upper extremity supported Sitting balance-Leahy Scale: Fair     Standing balance support: Bilateral upper extremity supported;During functional activity Standing balance-Leahy Scale: Poor Standing balance comment: requires use of RW for safe standing at this time                            Cognition Arousal/Alertness: Awake/alert Behavior During Therapy: WFL for tasks assessed/performed Overall Cognitive Status: Within Functional Limits for tasks assessed                                 General Comments: pt with better interaction with therapist with in person spanish interpretor, Cheryl Massey present      Exercises Total Joint Exercises Long Arc Quad: AROM;Left;10 reps;Seated    General Comments General comments (skin integrity, edema, etc.): VSS  Pertinent Vitals/Pain Pain Assessment: Faces Faces Pain Scale: Hurts little more (states "poquito" when asked, unable to give number, reports pain during ambulation but not at rest) Pain Location: pt grimacing and reaching for Left hip but states "poquito" when asked via google translater if she is in pain Pain Descriptors / Indicators: Grimacing;Guarding Pain Intervention(s): Limited activity  within patient's tolerance    Home Living Family/patient expects to be discharged to:: Private residence Living Arrangements: Other relatives                  Prior Function            PT Goals (current goals can now be found in the care plan section) Acute Rehab PT Goals Patient Stated Goal: home Progress towards PT goals: Progressing toward goals    Frequency    7X/week      PT Plan Current plan remains appropriate    Co-evaluation              AM-PAC PT "6 Clicks" Mobility   Outcome Measure  Help needed turning from your back to your side while in a flat bed without using bedrails?: A Little Help needed moving from lying on your back to sitting on the side of a flat bed without using bedrails?: A Little Help needed moving to and from a bed to a chair (including a wheelchair)?: A Little Help needed standing up from a chair using your arms (e.g., wheelchair or bedside chair)?: A Little Help needed to walk in hospital room?: A Little Help needed climbing 3-5 steps with a railing? : A Little 6 Click Score: 18    End of Session Equipment Utilized During Treatment: Gait belt Activity Tolerance: Patient tolerated treatment well Patient left: in chair;with call bell/phone within reach;with chair alarm set;with nursing/sitter in room Nurse Communication: Mobility status PT Visit Diagnosis: Unsteadiness on feet (R26.81);Muscle weakness (generalized) (M62.81);Difficulty in walking, not elsewhere classified (R26.2)     Time: 4403-4742 PT Time Calculation (min) (ACUTE ONLY): 48 min  Charges:  $Gait Training: 23-37 mins $Therapeutic Activity: 8-22 mins                     Kittie Plater, PT, DPT Acute Rehabilitation Services Pager #: [phone] Office #: [phone]    Berline Lopes 03/12/2021, 2:13 PM

## 2021-03-12 NOTE — Progress Notes (Signed)
Rechecked BS 379. Family in with fruits for pt. Educated pt and family on diet. Verbalized understanding.

## 2021-03-12 NOTE — TOC Initial Note (Addendum)
Transition of Care [facility]) - Initial/Assessment Note    Patient Details  Name: Cheryl Massey MRN: [id number] Date of Birth: 1956/01/28  [facility] CM/SW Contact:    Sharin Mons, RN Phone Number: 03/12/2021, 9:55 AM  Clinical Narrative:                 Presents with L hip OA, hx of DM, HTN, stroke, hysterectomy, appendectomy. From home with family ( daughter, Cheryl). Speaks Spanish, no [place]. Pt without insurance. PCP: [place] / Juluis Mire NP.       - s/p L Total hip replacement, 3/28  NCM spoke with pt @ bedside with intrepeter [place], and daughter regarding d/c planning. States PTA independent with ADL's, no DME usage.  Pt with noted orders for HHPT and DME : rolling walker and 3 in1/ [facility]. Pt agreeable to [facility] services.  Referal will be made with charity agency for the week for [facility] services...Marland Kitchenacceptance pending finanical assessment and agency's staffing. DME will be delivered to pt's room prior to d/c, referral made with [facility]/ Freda Munro. Equipment will be delivered to bedside prior to d/c.  Pt without Rx  MED concerns or affordability issues. Pt with transportation to home.  Ranee Massey (Daughter)      [phone]      3/29 @ 11:23 am Referral made with [facility] for charity care HHPT, acceptance pending....   TOC team will continue to monitor and assist with TOC needs.   3/29 @ 1511 Approval received for home health services with [facility]  Expected Discharge Plan: [place] Barriers to Discharge: Continued Medical Work up   Patient Goals and CMS Choice        Expected Discharge Plan and Services Expected Discharge Plan: [place]         Expected Discharge Date: 03/13/21                                    Prior Living Arrangements/Services                       Activities of Daily Living      Permission  Sought/Granted                  Emotional Assessment              Admission diagnosis:  Status post total replacement of left hip [Z96.642] Patient Active Problem List   Diagnosis Date Noted  . 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 Primary osteoarthritis of left hip 10/05/2020  . CVA (cerebral vascular accident) ([place]) 10/11/2019  . CVA (cerebrovascular accident) ([place]) 10/11/2019  . TIA (transient ischemic attack) 03/28/2019  . Essential hypertension 03/28/2019  . Type 2 diabetes mellitus treated without insulin ([place]) 03/28/2019   PCP:  Kerin Perna, NP Pharmacy:   [facility], [address] [place] [address] Phone: [phone] Fax: [phone]     Social Determinants of Health (SDOH) Interventions    Readmission Risk Interventions No flowsheet data found.

## 2021-03-12 NOTE — Plan of Care (Signed)
  Problem: Activity: Goal: Risk for activity intolerance will decrease Outcome: Progressing   Problem: Pain Managment: Goal: General experience of comfort will improve Outcome: Progressing   Problem: Safety: Goal: Ability to remain free from injury will improve Outcome: Progressing   

## 2021-03-12 NOTE — Progress Notes (Signed)
Subjective: 1 Day Post-Op Procedure(s) (LRB): LEFT TOTAL HIP ARTHROPLASTY ANTERIOR APPROACH (Left) Patient reports pain as mild.    Objective: Vital signs in last 24 hours: Temp:  [97 F (36.1 C)-98.7 F (37.1 C)] 97.5 F (36.4 C) (03/29 0754) Pulse Rate:  [50-87] 80 (03/29 0754) Resp:  [11-19] 19 (03/29 0754) BP: (94-161)/(54-76) 95/65 (03/29 0754) SpO2:  [98 %-100 %] 98 % (03/29 0754) Weight:  [67 kg] 67 kg (03/28 0817)  Intake/Output from previous day: 03/28 0701 - 03/29 0700 In: 1310 [P.O.:60; I.V.:950; IV Piggyback:300] Out: 1650 [Urine:1600; Blood:50] Intake/Output this shift: No intake/output data recorded.  Recent Labs    03/12/21 0324  HGB 10.0*   Recent Labs    03/12/21 0324  WBC 12.4*  RBC 3.45*  HCT 30.1*  PLT 226   Recent Labs    03/12/21 0324  NA 133*  K 4.2  CL 104  CO2 23  BUN 15  CREATININE 0.71  GLUCOSE 146*  CALCIUM 8.7*   No results for input(s): LABPT, INR in the last 72 hours.  Neurologically intact Neurovascular intact Sensation intact distally Intact pulses distally Dorsiflexion/Plantar flexion intact Incision: dressing C/D/I No cellulitis present Compartment soft   Assessment/Plan: 1 Day Post-Op Procedure(s) (LRB): LEFT TOTAL HIP ARTHROPLASTY ANTERIOR APPROACH (Left) Advance diet Up with therapy D/C IV fluids WBAT LLE Anticipate d/c home tomorrow unless progresses well with PT today.      Aundra Dubin 03/12/2021, 8:07 AM

## 2021-03-13 ENCOUNTER — Telehealth: Payer: Self-pay

## 2021-03-13 NOTE — Telephone Encounter (Signed)
Transition Care Management Unsuccessful Follow-up Telephone Call  Date of discharge and from where:  [place] on 03/12/2021  Attempts:  1st Attempt  Reason for unsuccessful TCM follow-up call:  Left voice message , Unable to reach pt to schedule HFU appointment.

## 2021-03-14 ENCOUNTER — Telehealth: Payer: Self-pay

## 2021-03-14 NOTE — Telephone Encounter (Signed)
Opened in error

## 2021-03-14 NOTE — Telephone Encounter (Signed)
Transition Care Management Follow-up Telephone Call  Call placed with assistance of Spanish interpreter # [id number]/[employer].  Call completed with patient's daughter, Cheryl Massey  Date of discharge and from where: 03/12/2021, [facility]  How have you been since you were released from the hospital? She is doing okay.  Walking on her own but family is there for support.   Any questions or concerns? Yes - she had a question about the surgical site dressing.  Instructed her to call Dr Erlinda Hong  Items Reviewed:  Did the pt receive and understand the discharge instructions provided? Yes   Medications obtained and verified? Yes  -she said that they have all medications and did not have any questions about the med regime  Other? No   Any new allergies since your discharge? No   Do you have support at home? Yes , she is with her daughter  Cheryl Massey and Equipment/Supplies: Were home health services ordered? yes If so, what is the name of the agency? [facility]  Has the agency set up a time to come to the patient's home? No, patients daughter has the number for [facility] on AVS and can call if she does not hear by tomorrow.  Were any new equipment or medical supplies ordered?  Yes: RW, 3:1 commode What is the name of the medical supply agency? [facility] Were you able to get the supplies/equipment? yes Do you have any questions related to the use of the equipment or supplies? No  Functional Questionnaire: (I = Independent and D = Dependent) ADLs: has RW for ambulation. Family assists with ADLS if needed  Follow up appointments reviewed:   PCP Hospital f/u appt confirmed? Yes  Juluis Mire, NP 03/27/2021 .  [facility] f/u appt confirmed? Yes -Dr Erlinda Hong- 03/26/2021 .  Are transportation arrangements needed? No   If their condition worsens, is the pt aware to call PCP or go to the Emergency Dept.? Yes  Was the patient provided with contact information for the PCP's office  or ED? Yes  Was to pt encouraged to call back with questions or concerns? Yes

## 2021-03-22 ENCOUNTER — Other Ambulatory Visit: Payer: Self-pay

## 2021-03-22 MED FILL — Glipizide Tab 10 MG: ORAL | 30 days supply | Qty: 60 | Fill #0 | Status: AC

## 2021-03-26 ENCOUNTER — Ambulatory Visit (INDEPENDENT_AMBULATORY_CARE_PROVIDER_SITE_OTHER): Payer: Self-pay | Admitting: Physician Assistant

## 2021-03-26 DIAGNOSIS — Z96642 Presence of left artificial hip joint: Secondary | ICD-10-CM

## 2021-03-26 MED ORDER — OXYCODONE-ACETAMINOPHEN 5-325 MG PO TABS: 1.0000 | ORAL_TABLET | Freq: Three times a day (TID) | ORAL | 0 refills | Status: DC | PRN

## 2021-03-26 NOTE — Progress Notes (Signed)
   Post-Op Visit Note   Patient: Cheryl Massey [facility] [name]           Date of Birth: August 08, 1956           MRN: [id number] Visit Date: 03/26/2021 PCP: Kerin Perna, NP   Assessment & Plan:  Chief Complaint:  Chief Complaint  Patient presents with  . Left Hip - Follow-up   Visit Diagnoses:  1. Status post total replacement of left hip     Plan: Patient is a pleasant 65 year old female who comes in today 2 weeks out left total hip replacement.  She has been doing well.  Home health physical therapy has been out once but is scheduled to come out again.  She is ambulating with a walker.  She is in moderate pain which is relieved with Percocet.  No fevers or chills.  Examination of left hip reveals a well healing surgical incision with nylon sutures in place.  No evidence of infection or cellulitis.  Calf is soft nontender.  She is neurovascular intact distally.  Today, I refilled her Percocet.  I have also sent in a referral for outpatient physical therapy.  She will follow up with [place] in 2 weeks time for AP pelvis x-rays.  Call with concerns or questions in the meantime.  Follow-Up Instructions: No follow-ups on file.   Orders:  Orders Placed This Encounter  Procedures  . Ambulatory referral to Physical Therapy   Meds ordered this encounter  Medications  . oxyCODONE-acetaminophen (PERCOCET) 5-325 MG tablet    Sig: Take 1-2 tablets by mouth every 8 (eight) hours as needed.    Dispense:  40 tablet    Refill:  0    Imaging: No new imaging  PMFS History: Patient Active Problem List   Diagnosis Date Noted  . 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 Primary osteoarthritis of left hip 10/05/2020  . CVA (cerebral vascular accident) ([place]) 10/11/2019  . CVA (cerebrovascular accident) ([place]) 10/11/2019  . TIA (transient ischemic attack) 03/28/2019  . Essential hypertension 03/28/2019  . Type 2 diabetes mellitus treated without insulin ([place]) 03/28/2019   Past  Medical History:  Diagnosis Date  . Arthritis    hands  . Diabetes mellitus without complication ([place])   . Hypertension   . Stroke [facility]) 03/28/2019    Family History  Problem Relation Age of Onset  . Hypertension Mother   . Hypertension Father     Past Surgical History:  Procedure Laterality Date  . ABDOMINAL HYSTERECTOMY    . APPENDECTOMY    . arthroplasty Right 2005  . TOTAL HIP ARTHROPLASTY Left 03/11/2021  . TOTAL HIP ARTHROPLASTY Left 03/11/2021   Procedure: LEFT TOTAL HIP ARTHROPLASTY ANTERIOR APPROACH;  Surgeon: Leandrew Koyanagi, MD;  Location: [place];  Service: Orthopedics;  Laterality: Left;   Social History   Occupational History  . Not on file  Tobacco Use  . Smoking status: Never Smoker  . Smokeless tobacco: Never Used  Vaping Use  . Vaping Use: Never used  Substance and Sexual Activity  . Alcohol use: No  . Drug use: No  . Sexual activity: Yes    Birth control/protection: Surgical

## 2021-03-27 ENCOUNTER — Encounter (INDEPENDENT_AMBULATORY_CARE_PROVIDER_SITE_OTHER): Payer: Self-pay | Admitting: Primary Care

## 2021-03-27 ENCOUNTER — Other Ambulatory Visit: Payer: Self-pay

## 2021-03-27 ENCOUNTER — Ambulatory Visit (INDEPENDENT_AMBULATORY_CARE_PROVIDER_SITE_OTHER): Payer: Self-pay | Admitting: Primary Care

## 2021-03-27 VITALS — BP 114/58 | HR 66 | Temp 97.3°F | Ht 63.0 in | Wt 148.6 lb

## 2021-03-27 DIAGNOSIS — Z09 Encounter for follow-up examination after completed treatment for conditions other than malignant neoplasm: Secondary | ICD-10-CM

## 2021-03-27 DIAGNOSIS — E119 Type 2 diabetes mellitus without complications: Secondary | ICD-10-CM

## 2021-03-27 DIAGNOSIS — I10 Essential (primary) hypertension: Secondary | ICD-10-CM

## 2021-03-27 MED ORDER — METFORMIN HCL 1000 MG PO TABS
1000.0000 mg | ORAL_TABLET | Freq: Two times a day (BID) | ORAL | 3 refills | Status: DC
  Filled 2021-03-27: qty 60, 30d supply, fill #0

## 2021-03-27 NOTE — Progress Notes (Signed)
CC: Hospital followed     HPI [name]. Cheryl Massey 65 y.o.Hispanic female Cheryl Massey [id number]) presents for follow up from the hospital. Admit date to the hospital was 03/11/21, patient was discharged from the hospital on 03/12/21, patient was admitted for: Unilateral primary osteoarthritis, left hip-elective THR. Only concern is where tape was on her hip rash appeared and itched- explain contact dermatitis. May use Benadryl 12.5- 25 mg.    Past Medical History:  Diagnosis Date  . Arthritis    hands  . Diabetes mellitus without complication ([place])   . Hypertension   . Stroke ([place]) 03/28/2019     No Known Allergies    Current Outpatient Medications on File Prior to Visit  Medication Sig Dispense Refill  . aspirin EC 81 MG tablet Take 1 tablet (81 mg total) by mouth 2 (two) times daily. To be taken after surgery 84 tablet 0  . atorvastatin (LIPITOR) 20 MG tablet Take 1 tablet (20 mg total) by mouth daily. 90 tablet 1  . docusate sodium (COLACE) 100 MG capsule Take 1 capsule (100 mg total) by mouth daily as needed. 30 capsule 2  . glipiZIDE (GLUCOTROL) 10 MG tablet Take 1 tablet (10 mg total) by mouth 2 (two) times daily before a meal. 180 tablet 1  . lisinopril (ZESTRIL) 5 MG tablet Take 1 tablet (5 mg total) by mouth daily. 90 tablet 3  . metFORMIN (GLUCOPHAGE) 500 MG tablet Take 500 mg by mouth 2 (two) times daily.    . methocarbamol (ROBAXIN) 500 MG tablet Take 1 tablet (500 mg total) by mouth 2 (two) times daily as needed. To be taken after surgery 20 tablet 0  . ondansetron (ZOFRAN) 4 MG tablet Take 1 tablet (4 mg total) by mouth every 8 (eight) hours as needed for nausea or vomiting. 40 tablet 0  . oxyCODONE-acetaminophen (PERCOCET) 5-325 MG tablet Take 1-2 tablets by mouth every 8 (eight) hours as needed. 40 tablet 0  . sitaGLIPtin (JANUVIA) 100 MG tablet Take 1 tablet (100 mg total) by mouth daily. 90 tablet 1  . sulfamethoxazole-trimethoprim (BACTRIM DS) 800-160 MG tablet Take 1  tablet by mouth 2 (two) times daily. To be taken after surgery 20 tablet 0  . topiramate (TOPAMAX) 25 MG tablet Take 1 tablet (25 mg total) by mouth every 3 (three) days. (Patient not taking: No sig reported) 30 tablet 0   No current facility-administered medications on file prior to visit.    ROS: all negative except above.   Physical Exam: Filed Weights   03/27/21 0939  Weight: 148 lb 9.6 oz (67.4 kg)   BP (!) 114/58 (BP Location: Left Arm, Patient Position: Sitting, Cuff Size: Normal)   Pulse 66   Temp (!) 97.3 F (36.3 C) (Temporal)   Ht 5\' 3"  (1.6 m)   Wt 148 lb 9.6 oz (67.4 kg)   SpO2 100%   BMI 26.32 kg/m  General Appearance: Well nourished, in acute  distress. Eyes: PERRLA, EOMs, conjunctiva no swelling or erythema Sinuses: No Frontal/maxillary tenderness ENT/Mouth: Ext aud canals clear, TMs without erythema, bulging. Hearing normal.  Neck: Supple, thyroid normal.  Respiratory: Respiratory effort normal, BS equal bilaterally without rales, rhonchi, wheezing or stridor.  Cardio: RRR with no MRGs. Brisk peripheral pulses without edema.  Abdomen: Soft, + BS.  Non tender, no guarding, rebound, hernias, masses. Lymphatics: Non tender without lymphadenopathy.  Musculoskeletal: Full ROM, 5/5 strength, normal gait.  Skin: Warm, dry without rashes, lesions, ecchymosis.  Neuro: Cranial nerves intact.  Normal muscle tone, no cerebellar symptoms. Sensation intact.  Psych: Awake and oriented X 3, normal affect, Insight and Judgment appropriate.    Cheryl Massey was seen today for hospitalization follow-up.  Diagnoses and all orders for this visit:  Type 2 diabetes mellitus treated without insulin ([place]) increased metformin from 500mg  bid to 1000mg  bid . : Goal of therapy:  7  hemoglobin A1c. Continue to monitor foods that are high in carbohydrates are the following rice, potatoes, breads, sugars, and pastas.  Reduction in the intake (eating) will assist in lowering your blood sugars. -      metFORMIN (GLUCOPHAGE) 1000 MG tablet; Take 1 tablet (1,000 mg total) by mouth 2 (two) times daily with a meal.  Hospital discharge follow-up S/p THR-   Essential hypertension Bp well controlled on lisinopril 5mg  and for renal protection  Reviewed hospitalization, labs and imaging   Kerin Perna, NP 9:57 AM

## 2021-03-28 ENCOUNTER — Ambulatory Visit: Payer: Self-pay

## 2021-04-03 ENCOUNTER — Other Ambulatory Visit: Payer: Self-pay

## 2021-04-03 MED FILL — Lisinopril Tab 5 MG: ORAL | 30 days supply | Qty: 30 | Fill #0 | Status: AC

## 2021-04-23 ENCOUNTER — Other Ambulatory Visit: Payer: Self-pay

## 2021-04-23 ENCOUNTER — Encounter (INDEPENDENT_AMBULATORY_CARE_PROVIDER_SITE_OTHER): Payer: Self-pay | Admitting: Primary Care

## 2021-04-23 ENCOUNTER — Ambulatory Visit (INDEPENDENT_AMBULATORY_CARE_PROVIDER_SITE_OTHER): Payer: Self-pay | Admitting: Primary Care

## 2021-04-23 VITALS — BP 128/75 | HR 57 | Temp 97.3°F | Ht 63.0 in | Wt 144.0 lb

## 2021-04-23 DIAGNOSIS — Z1211 Encounter for screening for malignant neoplasm of colon: Secondary | ICD-10-CM

## 2021-04-23 DIAGNOSIS — Z760 Encounter for issue of repeat prescription: Secondary | ICD-10-CM

## 2021-04-23 DIAGNOSIS — E782 Mixed hyperlipidemia: Secondary | ICD-10-CM

## 2021-04-23 DIAGNOSIS — I10 Essential (primary) hypertension: Secondary | ICD-10-CM

## 2021-04-23 DIAGNOSIS — E119 Type 2 diabetes mellitus without complications: Secondary | ICD-10-CM

## 2021-04-23 LAB — POCT GLYCOSYLATED HEMOGLOBIN (HGB A1C): Hemoglobin A1C: 6.5 % — AB (ref 4.0–5.6)

## 2021-04-23 LAB — GLUCOSE, POCT (MANUAL RESULT ENTRY): POC Glucose: 57 mg/dl — AB (ref 70–99)

## 2021-04-23 MED ORDER — SITAGLIPTIN PHOSPHATE 100 MG PO TABS
ORAL_TABLET | Freq: Every day | ORAL | 1 refills | Status: DC
  Filled 2021-04-23: qty 30, 30d supply, fill #0

## 2021-04-23 MED ORDER — ATORVASTATIN CALCIUM 20 MG PO TABS
20.0000 mg | ORAL_TABLET | Freq: Every day | ORAL | 1 refills | Status: DC
  Filled 2021-04-23: qty 30, 30d supply, fill #0

## 2021-04-23 MED ORDER — LISINOPRIL 5 MG PO TABS
5.0000 mg | ORAL_TABLET | Freq: Every day | ORAL | 3 refills | Status: DC
  Filled 2021-04-23 – 2021-05-03 (×2): qty 90, 90d supply, fill #0

## 2021-04-23 NOTE — Progress Notes (Signed)
Subjective:  Patient ID: Cheryl Massey, female    DOB: 02/20/1956  Age: 65 y.o. MRN: [id number]  CC: Diabetes   HPI Cheryl Massey Cheryl Massey presents for follow-up of diabetes. Patient does not check blood sugar at home  Compliant with meds - Yes Checking CBGs? No  Fasting avg -   Postprandial average -  Exercising regularly? - Yes Watching carbohydrate intake? - Yes Neuropathy ? - No Hypoglycemic events - Yes  - Recovers with :   Pertinent ROS:  Polyuria - Yes Polydipsia - No Vision problems - No  Medications as noted below. Taking them regularly without complication/adverse reaction being reported today.   History Cheryl Massey has a past medical history of Arthritis, Diabetes mellitus without complication ([place]), Hypertension, and Stroke ([place]) (03/28/2019).   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   Her family history includes Hypertension in her father and mother.She reports that she has never smoked. She has never used smokeless tobacco. She reports that she does not drink alcohol and does not use drugs.  Current Outpatient Medications on File Prior to Visit  Medication Sig Dispense Refill  . aspirin EC 81 MG tablet Take 1 tablet (81 mg total) by mouth 2 (two) times daily. To be taken after surgery 84 tablet 0  . docusate sodium (COLACE) 100 MG capsule Take 1 capsule (100 mg total) by mouth daily as needed. 30 capsule 2  . metFORMIN (GLUCOPHAGE) 1000 MG tablet Take 1 tablet (1,000 mg total) by mouth 2 (two) times daily with a meal. 180 tablet 3  . methocarbamol (ROBAXIN) 500 MG tablet Take 1 tablet (500 mg total) by mouth 2 (two) times daily as needed. To be taken after surgery 20 tablet 0  . naproxen (NAPROSYN) 500 MG tablet TAKE 1 TABLET (500 MG TOTAL) BY MOUTH 2 (TWO) TIMES DAILY WITH A MEAL. 30  tablet 0  . ondansetron (ZOFRAN) 4 MG tablet Take 1 tablet (4 mg total) by mouth every 8 (eight) hours as needed for nausea or vomiting. 40 tablet 0  . oxyCODONE-acetaminophen (PERCOCET) 5-325 MG tablet Take 1-2 tablets by mouth every 8 (eight) hours as needed. 40 tablet 0  . sulfamethoxazole-trimethoprim (BACTRIM DS) 800-160 MG tablet Take 1 tablet by mouth 2 (two) times daily. To be taken after surgery 20 tablet 0   No current facility-administered medications on file prior to visit.    ROS Review of Systems  Endocrine: Positive for polyuria.  All other systems reviewed and are negative.   Objective:  BP 128/75 (BP Location: Right Arm, Patient Position: Sitting, Cuff Size: Normal)   Pulse (!) 57   Temp (!) 97.3 F (36.3 C) (Temporal)   Ht 5' 3" (1.6 m)   Wt 144 lb (65.3 kg)   SpO2 100%   BMI 25.51 kg/m   BP Readings from Last 3 Encounters:  04/23/21 128/75  03/27/21 (!) 114/58  03/12/21 (!) 141/79    Wt Readings from Last 3 Encounters:  04/23/21 144 lb (65.3 kg)  03/27/21 148 lb 9.6 oz (67.4 kg)  03/11/21 147 lb 11.3 oz (67 kg)    Physical Exam General: No apparent distress.Well nourish female (Body mass index is 25.51 kg/m.) Eyes: Extraocular eye movements intact, pupils equal and round. Neck: Supple, trachea midline. Thyroid: No enlargement, mobile without fixation, no tenderness. Cardiovascular: Regular rhythm and rate, no murmur, normal radial pulses.  Respiratory: Normal respiratory effort, clear to auscultation. Gastrointestinal: Normal pitch active bowel sounds, nontender abdomen without distention or appreciable hepatomegaly. Musculoskeletal: Normal muscle tone, no tenderness on palpation of tibia, slight  kyphosis. Skin: Appropriate warmth, no visible rash. Mental status: Alert, conversant, speech clear, thought logical, appropriate mood and affect, no hallucinations or delusions evident. Hematologic/lymphatic: No cervical adenopathy, no visible  ecchymoses. Lab Results  Component Value Date   HGBA1C 6.5 (A) 04/23/2021   HGBA1C 7.6 (A) 01/24/2021   HGBA1C 9.0 (A) 10/01/2020    Lab Results  Component Value Date   WBC 12.4 (H) 03/12/2021   HGB 10.0 (L) 03/12/2021   HCT 30.1 (L) 03/12/2021   PLT 226 03/12/2021   GLUCOSE 146 (H) 03/12/2021   CHOL 158 01/03/2021   TRIG 212 (H) 01/03/2021   HDL 42 01/03/2021   LDLDIRECT 92.9 03/29/2019   LDLCALC 81 01/03/2021   ALT 18 03/07/2021   AST 22 03/07/2021   NA 133 (L) 03/12/2021   K 4.2 03/12/2021   CL 104 03/12/2021   CREATININE 0.71 03/12/2021   BUN 15 03/12/2021   CO2 23 03/12/2021   TSH 1.136 03/29/2019   INR 1.0 03/07/2021   HGBA1C 6.5 (A) 04/23/2021     Assessment & Plan:  [name] was seen today for diabetes.  Diagnoses and all orders for this visit:  Type 2 diabetes mellitus treated without insulin ([place]) Goals for glycemic control related to A1c measurements: Goal of therapy: is met  Less than 6.5 hemoglobin A1c. Continue to monitor foods that are high in carbohydrates are the following rice, potatoes, breads, sugars, and pastas.  Reduction in the intake (eating) will assist in lowering your blood sugars. -     HgB A1c 6.5  -     Glucose (CBG) -     sitaGLIPtin (JANUVIA) 100 MG tablet; TAKE 1 TABLET (100 MG TOTAL) BY MOUTH DAILY.  Colon cancer screening -     Fecal occult blood, imunochemical; Future  Essential hypertension Renal protection  -     lisinopril (ZESTRIL) 5 MG tablet; Take 1 tablet (5 mg total) by mouth daily.  Medication refill -     sitaGLIPtin (JANUVIA) 100 MG tablet; TAKE 1 TABLET (100 MG TOTAL) BY MOUTH DAILY. -     lisinopril (ZESTRIL) 5 MG tablet; Take 1 tablet (5 mg total) by mouth daily. -     atorvastatin (LIPITOR) 20 MG tablet; Take 1 tablet (20 mg total) by mouth daily.  Mixed hyperlipidemia -     atorvastatin (LIPITOR) 20 MG tablet; Take 1 tablet (20 mg total) by mouth daily.   I have discontinued Camillo Flaming. [name]'s  topiramate, glipiZIDE, glipiZIDE, and glipiZIDE. I am also having her maintain her aspirin EC, sulfamethoxazole-trimethoprim, methocarbamol, ondansetron, docusate sodium, oxyCODONE-acetaminophen, metFORMIN, sitaGLIPtin, lisinopril, atorvastatin, and naproxen.  Meds ordered this encounter  Medications  . sitaGLIPtin (JANUVIA) 100 MG tablet    Sig: TAKE 1 TABLET (100 MG TOTAL) BY MOUTH DAILY.    Dispense:  90 tablet    Refill:  1  . lisinopril (ZESTRIL) 5 MG tablet    Sig: Take 1 tablet (5 mg total) by mouth daily.    Dispense:  90 tablet    Refill:  3  . atorvastatin (LIPITOR) 20 MG tablet    Sig: Take 1 tablet (20 mg total) by mouth daily.    Dispense:  90 tablet    Refill:  1     Follow-up:   Return in about 6 months (  around 10/24/2021) for DM follow up.  The above assessment and management plan was discussed with the patient. The patient verbalized understanding of and has agreed to the management plan. Patient is aware to call the clinic if symptoms fail to improve or worsen. Patient is aware when to return to the clinic for a follow-up visit. Patient educated on when it is appropriate to go to the emergency department.   Juluis Mire, NP-C

## 2021-04-23 NOTE — Patient Instructions (Signed)
Diabetes mellitus y [place] fsica Diabetes Mellitus and Exercise Hacer actividad fsica habitualmente es importante para el estado de salud general, en especial para las personas que tienen diabetes mellitus. La actividad fsica no solo se reduce a Pharmacist, hospital. Aporta muchos beneficios para la salud, como aumento de la fuerza muscular y la densidad sea, y reduccin de las grasas corporales y Dealer. Esto mejora el estado fsico, la flexibilidad y la resistencia, y todo ello redunda en un mejor estado de salud general. Cules son los beneficios de la actividad fsica si tengo diabetes? La actividad fsica tiene muchos beneficios para las personas con diabetes. Incluyen los siguientes:  Ayuda a Sports coach y Engineer, agricultural en la sangre (glucosa) bajo control.  Mejora la respuesta del cuerpo a la hormona insulina porque optimiza la sensibilidad a la insulina.  Reduce la cantidad de insulina que el cuerpo necesita.  Reduce el riesgo de tener una enfermedad cardaca porque: ? [employer] de colesterol "malo" y triglicridos. ? Aumenta los niveles de colesterol "bueno". ? [place] la presin arterial. ? Disminuye la glucemia. Cul es mi plan de Kandace Blitz? El mdico o un educador para la diabetes certificado pueden ayudarlo a Engineer, petroleum del tipo y de la frecuencia de actividad fsica adecuado para usted. Esto se denomina "plan de actividad". Asegrese de lo siguiente:  Haga por lo menos 145minutos semanales de ejercicios de intensidad media o alta. Los ejercicios pueden incluir caminar a paso rpido, andar en bicicleta o hacer gimnasia aerbica en el agua.  Haga ejercicios de elongacin y de fortalecimiento, como yoga o levantamiento de pesas, por lo menos 2veces por semana.  Reparta la actividad en al menos 3das de la semana.  Haga algn tipo de actividad fsica cada da. ? No deje pasar ms de 2das seguidos sin hacer algn tipo de actividad fsica. ? No  permanezca inactivo durante ms de 78minutos seguidos. Tmese descansos frecuentes para caminar o estirarse.  Elija ejercicios o actividades que disfrute. Establezca objetivos realistas.  Comience lentamente y aumente de [place] gradual la intensidad de la actividad fsica con el correr del [place].   Cmo controlo la diabetes durante la actividad fsica? Controlar su nivel de glucemia  Contrlese la glucemia antes y despus de ejercitarse. Si el nivel de glucemia es: ? 240mg /dl (13.78mmol/l) o ms antes de comenzar a hacer actividad fsica, controle la orina para detectar la presencia de cetonas. Estas son sustancias qumicas producidas por el hgado. Si tiene [employer] orina, no haga ejercicio hasta que la glucemia se normalice. ? 100 mg/dl (5.6 mmol/l) o menos, tome una colacin que [employer] 15 y 28 gramos de carbohidratos. Controle la glucemia 15 minutos despus de la colacin para asegurarse de que el nivel de glucosa est por encima de 100 mg/dl (5.6 mmol/l) antes de comenzar a hacer actividad fsica.  Conozca los sntomas de la glucemia baja (hipoglucemia) y aprenda cmo tratarla. El riesgo de tener hipoglucemia [place] durante y despus de hacer actividad fsica. Siga estos consejos y las instrucciones del mdico  Tenga una colacin de carbohidratos que sea de accin rpida antes, durante y despus de ejercitarse, a fin de evitar o tratar la hipoglucemia.  Evite inyectarse insulina en las zonas del cuerpo que ejercitar. Por ejemplo, evite inyectarse insulina en: ? Los brazos, cuando est por jugar al tenis. ? Las piernas, cuando est por irse a trotar.  Lleve registros de sus hbitos de actividad fsica. Esto puede ayudarlos a usted y al  mdico a adaptar el plan de control de la diabetes segn sea necesario. Escriba los siguientes datos: ? Los alimentos que consume antes y despus de Field seismologist actividad fsica. ? Los niveles de glucemia antes y despus de hacer  ejercicios. ? El tipo y cantidad de [place] fs[place] que Musician.  Trabaje con el mdico cuando comience un nuevo tipo de actividad fsica o ejercicio. Es posible que el mdico deba hacer lo siguiente: ? Asegurarse de que la actividad sea segura para usted. ? Ajustar la insulina, los otros medicamentos y los alimentos que usted consume.  Beba mucha agua mientras hace ejercicio. Esto previene la prdida de agua (deshidratacin) y los problemas causados por mucho calor en el cuerpo (golpe de calor).   Dnde buscar ms informacin  American Diabetes Association (Asociacin Estadounidense de la Diabetes): www.diabetes.org Resumen  Hacer actividad fsica habitualmente es importante para el estado de salud general, en especial para las personas que tienen diabetes mellitus.  Hacer actividad fsica tiene muchos beneficios para [employer]	[place]. Aumenta la fuerza muscular y la densidad sea, y reduce las grasas corporales y Dealer. Tambin disminuye y controla la glucemia.  El mdico o un educador para la diabetes certificado puede ayudarlo a Paediatric nurse un plan de actividades respecto del tipo y de la frecuencia de actividad fsica adecuados para usted.  Consulte al mdico para asegurarse de que cualquier actividad nueva sea segura para usted. Tambin trabaje con el mdico para ajustar la [place], los otros medicamentos y los alimentos que consume. Esta informacin no tiene Marine scientist el consejo del mdico. Asegrese de hacerle al mdico cualquier pregunta que tenga. Document Revised: 12/02/2019 Document Reviewed: 12/02/2019 Elsevier Patient Education  2021 [employer].

## 2021-04-24 ENCOUNTER — Ambulatory Visit (INDEPENDENT_AMBULATORY_CARE_PROVIDER_SITE_OTHER): Payer: Self-pay

## 2021-04-24 ENCOUNTER — Ambulatory Visit (INDEPENDENT_AMBULATORY_CARE_PROVIDER_SITE_OTHER): Payer: Self-pay | Admitting: Orthopaedic Surgery

## 2021-04-24 ENCOUNTER — Encounter: Payer: Self-pay | Admitting: Orthopaedic Surgery

## 2021-04-24 ENCOUNTER — Other Ambulatory Visit: Payer: Self-pay

## 2021-04-24 DIAGNOSIS — M1712 Unilateral primary osteoarthritis, left knee: Secondary | ICD-10-CM | POA: Insufficient documentation

## 2021-04-24 DIAGNOSIS — Z96642 Presence of left artificial hip joint: Secondary | ICD-10-CM

## 2021-04-24 MED ORDER — MELOXICAM 7.5 MG PO TABS
7.5000 mg | ORAL_TABLET | Freq: Two times a day (BID) | ORAL | 2 refills | Status: DC | PRN
  Filled 2021-04-24: qty 30, 15d supply, fill #0

## 2021-04-24 NOTE — Progress Notes (Signed)
Office Visit Note   Patient: Cheryl Massey           Date of Birth: Apr 27, 1956           MRN: [id number] Visit Date: 04/24/2021              Requested by: Kerin Perna, NP [address] [place],  [place] [address] PCP: Kerin Perna, NP   Assessment & Plan: Visit Diagnoses:  1. Status post total replacement of left hip   2. Primary osteoarthritis of left knee     Plan: Patient has done well from her left hip replacement.  She will wean to a cane for ambulation in public at this time.  She will continue with home exercises.  She can discontinue the home health PT.  For the left knee and treatment options discussed that she would like to try Voltaren gel meloxicam and hold off on injection for now.  We will see her back in 6 weeks for 53-month checkup of the left hip replacement.  Follow-Up Instructions: Return in about 6 weeks (around 06/05/2021).   Orders:  Orders Placed This Encounter  Procedures  . XR HIP UNILAT W OR W/O PELVIS 2-3 VIEWS LEFT  . XR Knee 1-2 Views Left   Meds ordered this encounter  Medications  . meloxicam (MOBIC) 7.5 MG tablet    Sig: Take 1 tablet (7.5 mg total) by mouth 2 (two) times daily as needed for pain.    Dispense:  30 tablet    Refill:  2      Procedures: No procedures performed   Clinical Data: No additional findings.   Subjective: Chief Complaint  Patient presents with  . Left Hip - Routine Post Op  . Left Knee - Pain    Patient is 6 weeks status post left total hip replacement on 03/11/2021.  She is mainly having pain in the left knee.  She has no real complaints regarding her left hip.  Denies any injuries to the left knee.  There is nighttime pain that is preventing her from sleeping.  Currently not taking any medications other than some naproxen.   Review of Systems  Constitutional: Negative.   HENT: Negative.   Eyes: Negative.   Respiratory: Negative.   Cardiovascular: Negative.   Endocrine:  Negative.   Musculoskeletal: Negative.   Neurological: Negative.   Hematological: Negative.   Psychiatric/Behavioral: Negative.   All other systems reviewed and are negative.    Objective: Vital Signs: There were no vitals taken for this visit.  Physical Exam Vitals and nursing note reviewed.  Constitutional:      Appearance: She is well-developed.  Pulmonary:     Effort: Pulmonary effort is normal.  Skin:    General: Skin is warm.     Capillary Refill: Capillary refill takes less than 2 seconds.  Neurological:     Mental Status: She is alert and oriented to person, place, and time.  Psychiatric:        Behavior: Behavior normal.        Thought Content: Thought content normal.        Judgment: Judgment normal.     Ortho Exam Left hip shows a fully healed surgical scar with a small scab.  No signs of infection.  There is no swelling.  Painless range of motion of the hip.  Strength is 4 out of 5 hip flexor.  Left knee shows mild patellofemoral crepitus with range of motion.  No joint line tenderness.  Negative Mc[place].  Collaterals and cruciates are stable.  No joint effusion.  Specialty Comments:  No specialty comments available.  Imaging: XR HIP UNILAT W OR W/O PELVIS 2-3 VIEWS LEFT  Result Date: 04/24/2021 Stable total hip replacement without complication  XR Knee 1-2 Views Left  Result Date: 04/24/2021 Moderate tricompartment degenerative changes    PMFS History: Patient Active Problem List   Diagnosis Date Noted  . Primary osteoarthritis of left knee 04/24/2021  . 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 Primary osteoarthritis of left hip 10/05/2020  . CVA (cerebral vascular accident) ([place]) 10/11/2019  . CVA (cerebrovascular accident) ([place]) 10/11/2019  . TIA (transient ischemic attack) 03/28/2019  . Essential hypertension 03/28/2019  . Type 2 diabetes mellitus treated without insulin ([place]) 03/28/2019   Past Medical History:  Diagnosis  Date  . Arthritis    hands  . Diabetes mellitus without complication ([place])   . Hypertension   . Stroke [facility]) 03/28/2019    Family History  Problem Relation Age of Onset  . Hypertension Mother   . Hypertension Father     Past Surgical History:  Procedure Laterality Date  . ABDOMINAL HYSTERECTOMY    . APPENDECTOMY    . arthroplasty Right 2005  . TOTAL HIP ARTHROPLASTY Left 03/11/2021  . TOTAL HIP ARTHROPLASTY Left 03/11/2021   Procedure: LEFT TOTAL HIP ARTHROPLASTY ANTERIOR APPROACH;  Surgeon: Leandrew Koyanagi, MD;  Location: [facility];  Service: Orthopedics;  Laterality: Left;   Social History   Occupational History  . Not on file  Tobacco Use  . Smoking status: Never Smoker  . Smokeless tobacco: Never Used  Vaping Use  . Vaping Use: Never used  Substance and Sexual Activity  . Alcohol use: No  . Drug use: No  . Sexual activity: Yes    Birth control/protection: Surgical

## 2021-05-01 ENCOUNTER — Other Ambulatory Visit: Payer: Self-pay

## 2021-05-03 ENCOUNTER — Other Ambulatory Visit: Payer: Self-pay

## 2021-05-17 ENCOUNTER — Other Ambulatory Visit: Payer: Self-pay

## 2021-05-23 ENCOUNTER — Encounter (INDEPENDENT_AMBULATORY_CARE_PROVIDER_SITE_OTHER): Payer: Self-pay | Admitting: Primary Care

## 2021-05-23 ENCOUNTER — Other Ambulatory Visit: Payer: Self-pay

## 2021-05-23 ENCOUNTER — Ambulatory Visit (INDEPENDENT_AMBULATORY_CARE_PROVIDER_SITE_OTHER): Payer: Self-pay | Admitting: Primary Care

## 2021-05-23 VITALS — BP 135/74 | HR 54 | Temp 97.3°F | Ht 63.0 in | Wt 144.4 lb

## 2021-05-23 DIAGNOSIS — R519 Headache, unspecified: Secondary | ICD-10-CM

## 2021-05-23 DIAGNOSIS — G8929 Other chronic pain: Secondary | ICD-10-CM

## 2021-05-23 DIAGNOSIS — F331 Major depressive disorder, recurrent, moderate: Secondary | ICD-10-CM

## 2021-05-23 MED ORDER — ESCITALOPRAM OXALATE 10 MG PO TABS
10.0000 mg | ORAL_TABLET | Freq: Every day | ORAL | 1 refills | Status: DC
  Filled 2021-05-23: qty 30, 30d supply, fill #0

## 2021-05-23 NOTE — Progress Notes (Signed)
Subjective:  Cheryl Massey [id number]   [facility] [name] is a 65 y.o. Hispanic female Cheryl Massey [id number] who presents for evaluation of headache. Symptoms began about 3 days ago. Generally, the headaches last about  all day  and dizzy   and occur continuously. The headaches do not seem to be related to any time of the day. The headaches are usually moderate and poorly described and are located in frontal .  The patient rates her most severe headaches a 8 on a scale from 1 to 10. Recently, the headaches have been increasing in frequency. Work attendance or other daily activities are not affected by the headaches. Precipitating factors include:  stress  . The headaches are usually not preceded by an aura. Associated neurologic symptoms: dizziness and stress . The patient denies decreased physical activity, loss of balance, muscle weakness, speech difficulties, vision problems, vomiting in the early morning, and not eating decrease in appetite  . Home treatment has included acetaminophen with some improvement. Other history includes: nothing pertinent. Family history includes no known family members with significant headaches.  Daughter Apolonio Schneiders confided that she has a heart condition and watches her sleep - she has sleep apnea . Therefore mother does not sleep. She also is not eating. Separate conversation with daughter she has noticed her crying a lot. This is a mother normal reaction to being scared she is going  The following portions of the patient's history were reviewed and updated as appropriate: allergies, current medications, past family history, past medical history, past social history, past surgical history, and problem list.  Review of Systems Review of Systems  Constitutional:  Positive for weight loss.       Not eating  Neurological:  Positive for headaches.  Psychiatric/Behavioral:  Positive for depression. The patient is nervous/anxious.   All other systems reviewed and  are negative.     Objective:    BP 135/74 (BP Location: Right Arm, Patient Position: Sitting, Cuff Size: Normal)   Pulse (!) 54   Temp (!) 97.3 F (36.3 C) (Temporal)   Ht 5\' 3"  (1.6 m)   Wt 144 lb 6.4 oz (65.5 kg)   SpO2 99%   BMI 25.58 kg/m   General Appearance:    Alert, cooperative, no distress, appears stated age  Head:    Normocephalic, without obvious abnormality, atraumatic  Eyes:    PERRL, conjunctiva/corneas clear, EOM's intact, fundi    benign, both eyes  Ears:    Normal TM's and external ear canals, both ears  Nose:   Nares normal, septum midline, mucosa normal, no drainage    or sinus tenderness     Neck:   Supple, symmetrical, trachea midline, no adenopathy;    thyroid:  no enlargement/tenderness/nodules; no carotid   bruit or JVD  Back:     Symmetric, no curvature, ROM normal, no CVA tenderness  Lungs:     Clear to auscultation bilaterally, respirations unlabored  Chest Wall:    No tenderness or deformity   Heart:    Regular rate and rhythm, S1 and S2 normal, no murmur, rub   or gallop     Abdomen:     Soft, non-tender, bowel sounds active all four quadrants,    no masses, no organomegaly  Genitalia:    Normal female without lesion, discharge or tenderness  Rectal:    Normal tone, normal prostate, no masses or tenderness;   guaiac negative stool  Extremities:   Extremities normal, atraumatic, no cyanosis or  edema  Pulses:   2+ and symmetric all extremities  Skin:   Skin color, texture, turgor normal, no rashes or lesions  Lymph nodes:   Cervical, supraclavicular, and axillary nodes normal  Neurologic:   CNII-XII intact, normal strength, sensation and reflexes    throughout      Assessment:  Cheryl Massey was seen today for headache.  Diagnoses and all orders for this visit:  Moderate episode of recurrent major depressive disorder (HCC)   escitalopram (LEXAPRO) 10 MG tablet; Take 1 tablet (10 mg total) by mouth daily.  Chronic nonintractable headache,  unspecified headache type See HPI  Other orders -     escitalopram (LEXAPRO) 10 MG tablet; Take 1 tablet (10 mg total) by mouth daily.     Plan:    Lie in darkened room and apply cold packs as needed for pain. Side effect profile discussed in detail. Asked to keep headache diary. Patient reassured that neurodiagnostic workup not indicated from benign H&P.

## 2021-05-23 NOTE — Progress Notes (Signed)
Pt states she has dizziness with the headache

## 2021-05-23 NOTE — Patient Instructions (Signed)
Dolor de cabeza general sin causa General Headache Without Cause El dolor de cabeza es un dolor o malestar que se siente en la zona de la cabeza o del cuello. Hay muchas causas y tipos de dolores de [place]. En algunos casos, es posible que no se encuentre la causa. Siga estas indicaciones en su casa: Controle su afeccin para detectar cualquier cambio. Infrmele a su mdico acerca de los cambios. Siga estos pasos para Building surveyor afeccin: Control del [employer] Home	[place] medicamentos de venta libre y los recetados solamente como se lo haya indicado el mdico. Cuando sienta dolor de cabeza acustese en un cuarto oscuro y tranquilo. Si se lo indican, aplquese hielo en la cabeza y en la zona del cuello: Ponga el hielo en una bolsa plstica. Coloque una toalla entre la piel y Therapist, nutritional. Coloque el hielo durante 20 minutos, 2 a 3 veces al da. Si se lo indican, aplique calor en la zona afectada. Use la fuente de calor que el mdico le recomiende, como una compresa de calor hmedo o una almohadilla trmica. Coloque una toalla entre la piel y la fuente de Freight forwarder. Aplique calor durante 20 a 30 minutos. Retire la fuente de calor si la piel se pone de color rojo brillante. Esto es muy importante si no puede Education officer, environmental, calor o fro. Puede correr un riesgo mayor de sufrir quemaduras. [place] luces tenues si las luces brillantes le molestan o sus dolores de cabeza [place].      Comida y bebida Mantenga un horario para las comidas. Si bebe alcohol: Limite la cantidad que bebe a lo siguiente: De 0 a 1 medida por da para las mujeres. De 0 a 2 medidas por da para los hombres. Est atento a la cantidad de alcohol que hay en las bebidas que toma. En los [place], una medida equivale a una botella de cerveza de 12 oz (355 ml), un vaso de vino de 5 oz (148 ml) o un vaso de una bebida alcohlica de alta graduacin de 1 oz (44 ml). Deje de tomar cafena o reduzca la cantidad que  consume. Indicaciones generales Lleve un registro diario para averiguar si ciertas cosas provocan los dolores de [place]. Registre, por ejemplo, lo siguiente: Lo que usted come y bebe. El tiempo que duerme. Algn cambio en su dieta o en los medicamentos. Hgase masajes o pruebe otras formas de relajarse. Limite el estrs. Sintese con la espalda recta. No contraiga (tensione) los msculos. No consuma ningn producto que contenga nicotina o tabaco. Estos incluyen los cigarrillos, el tabaco para Higher education careers adviser y los Psychologist, sport and exercise. Si necesita ayuda para dejar de fumar, consulte al mdico. Haga ejercicios con regularidad tal como se lo indic el mdico. Duerma lo suficiente. Esto a menudo significa entre 7 y 9 horas de sueo cada noche. Concurra a todas las visitas de control como se lo haya indicado el mdico. Esto es importante.   Comunquese con un mdico si: Los medicamentos no logran [employer]. Tiene un dolor de cabeza que es diferente a los otros dolores de [place]. Tiene malestar estomacal (nuseas) o vomita. Tiene fiebre. Solicite ayuda inmediatamente si: El dolor de [place] empeora rpidamente. El dolor empeora despus de hacer mucha actividad fsica. Sigue vomitando. Presenta rigidez en el cuello. Tiene dificultad para ver. Tiene dificultad para hablar. Siente dolor en el ojo o en el odo. Sus msculos estn dbiles, o pierde el control muscular. Pierde el equilibrio o tiene problemas para Writer. Siente que va a desvanecerse (  perder el conocimiento) o se desmaya. Est desorientado (confundido). Tiene una convulsin. Resumen El dolor de cabeza es un dolor o Tree surgeon que se siente en la zona de la cabeza o del cuello. Hay muchas causas y tipos de dolores de [place]. En algunos casos, es posible que no se encuentre la causa. Lleve un diario como ayuda para [employer] causa de los dolores de [place]. Controle su afeccin para Actuary cambio. Infrmele a  su mdico acerca de los cambios. Comunquese con un mdico si tiene un dolor de cabeza que es diferente de lo habitual o si el dolor de cabeza no se alivia con los medicamentos. Solicite ayuda de inmediato si el dolor de cabeza es muy intenso, vomita, tiene dificultad para ver, pierde el equilibrio o tiene una convulsin. Esta informacin no tiene Marine scientist el consejo del mdico. Asegrese de hacerle al mdico cualquier pregunta que tenga. Document Revised: 08/04/2018 Document Reviewed: 08/04/2018 Elsevier Patient Education  2021 [employer].

## 2021-06-05 ENCOUNTER — Encounter: Payer: Self-pay | Admitting: Orthopaedic Surgery

## 2021-06-05 ENCOUNTER — Other Ambulatory Visit: Payer: Self-pay

## 2021-06-05 ENCOUNTER — Ambulatory Visit (INDEPENDENT_AMBULATORY_CARE_PROVIDER_SITE_OTHER): Payer: Self-pay | Admitting: Physician Assistant

## 2021-06-05 DIAGNOSIS — Z96642 Presence of left artificial hip joint: Secondary | ICD-10-CM

## 2021-06-05 MED ORDER — DICLOFENAC SODIUM 75 MG PO TBEC
75.0000 mg | DELAYED_RELEASE_TABLET | Freq: Two times a day (BID) | ORAL | 0 refills | Status: DC | PRN
  Filled 2021-06-05: qty 60, 30d supply, fill #0

## 2021-06-05 MED ORDER — TRAMADOL HCL 50 MG PO TABS
50.0000 mg | ORAL_TABLET | Freq: Two times a day (BID) | ORAL | 2 refills | Status: DC | PRN
  Filled 2021-06-05: qty 30, 15d supply, fill #0

## 2021-06-05 NOTE — Progress Notes (Signed)
Post-Op Visit Note   Patient: Cheryl Massey [facility] [name]           Date of Birth: 28-Feb-1956           MRN: [id number] Visit Date: 06/05/2021 PCP: Kerin Perna, NP   Assessment & Plan:  Chief Complaint:  Chief Complaint  Patient presents with   Left Hip - Pain   Visit Diagnoses:  1. History of total hip replacement, left     Plan: Patient is [name] who comes in today with her daughter in addition to an interpreter.  She is 12 weeks out left total hip replacement.  She has been gradually improving but still notes some discomfort to the anterolateral thigh and into the groin at times.  This is worse with ambulation.  She is not taking any medication for this.  She denies any paresthesias to left lower extremity.  Examination of her left hip reveals a painless logroll.  She has slight pain with resisted hip abduction.  Negative straight leg raise.  She is neurovascular intact distally.  At this point, I believe the pain she is experiencing is part of the normal healing process and that she really needs to continue with her strengthening exercises.  I have called in Voltaren in addition to tramadol to take as needed.  She will follow-up with [place] in 3 months time for repeat evaluation and AP pelvis x-rays.  Dental prophylaxis reinforced.  Call with concerns or questions in meantime.  Follow-Up Instructions: Return in about 3 months (around 09/05/2021).   Orders:  No orders of the defined types were placed in this encounter.  Meds ordered this encounter  Medications   diclofenac (VOLTAREN) 75 MG EC tablet    Sig: Take 1 tablet (75 mg total) by mouth 2 (two) times daily between meals as needed.    Dispense:  60 tablet    Refill:  0   traMADol (ULTRAM) 50 MG tablet    Sig: Take 1 tablet (50 mg total) by mouth 2 (two) times daily between meals as needed.    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 ([place]) 03/28/2019   Past Medical History:  Diagnosis Date   Arthritis    hands   Diabetes mellitus without complication ([place])    Hypertension    Stroke ([facility]) 03/28/2019    Family History  Problem Relation Age of Onset   Hypertension Mother    Hypertension Father     Past Surgical History:  Procedure Laterality Date   ABDOMINAL HYSTERECTOMY     APPENDECTOMY     arthroplasty Right 2005   TOTAL HIP ARTHROPLASTY Left 03/11/2021   TOTAL HIP ARTHROPLASTY Left 03/11/2021   Procedure: LEFT TOTAL HIP ARTHROPLASTY ANTERIOR APPROACH;  Surgeon: Leandrew Koyanagi, MD;  Location: [facility];  Service: Orthopedics;  Laterality: Left;   Social History   Occupational History   Not on file  Tobacco Use   Smoking status: Never   Smokeless  tobacco: Never  Vaping Use   Vaping Use: Never used  Substance and Sexual Activity   Alcohol use: No   Drug use: No   Sexual activity: Yes    Birth control/protection: Surgical

## 2021-09-05 ENCOUNTER — Encounter: Payer: Self-pay | Admitting: Orthopaedic Surgery

## 2021-10-24 ENCOUNTER — Other Ambulatory Visit: Payer: Self-pay

## 2021-10-24 ENCOUNTER — Encounter (INDEPENDENT_AMBULATORY_CARE_PROVIDER_SITE_OTHER): Payer: Self-pay | Admitting: Primary Care

## 2021-10-24 ENCOUNTER — Ambulatory Visit (INDEPENDENT_AMBULATORY_CARE_PROVIDER_SITE_OTHER): Payer: Self-pay | Admitting: Primary Care

## 2021-10-24 VITALS — BP 147/83 | HR 55 | Temp 97.3°F | Wt 150.6 lb

## 2021-10-24 DIAGNOSIS — I10 Essential (primary) hypertension: Secondary | ICD-10-CM

## 2021-10-24 DIAGNOSIS — Z760 Encounter for issue of repeat prescription: Secondary | ICD-10-CM

## 2021-10-24 DIAGNOSIS — E782 Mixed hyperlipidemia: Secondary | ICD-10-CM

## 2021-10-24 DIAGNOSIS — E119 Type 2 diabetes mellitus without complications: Secondary | ICD-10-CM

## 2021-10-24 DIAGNOSIS — Z1211 Encounter for screening for malignant neoplasm of colon: Secondary | ICD-10-CM

## 2021-10-24 DIAGNOSIS — D539 Nutritional anemia, unspecified: Secondary | ICD-10-CM

## 2021-10-24 DIAGNOSIS — M19012 Primary osteoarthritis, left shoulder: Secondary | ICD-10-CM

## 2021-10-24 LAB — POCT GLYCOSYLATED HEMOGLOBIN (HGB A1C): Hemoglobin A1C: 8.4 % — AB (ref 4.0–5.6)

## 2021-10-24 MED ORDER — SITAGLIPTIN PHOSPHATE 100 MG PO TABS
ORAL_TABLET | Freq: Every day | ORAL | 1 refills | Status: DC
  Filled 2021-10-24: qty 30, 30d supply, fill #0

## 2021-10-24 MED ORDER — ATORVASTATIN CALCIUM 20 MG PO TABS
20.0000 mg | ORAL_TABLET | Freq: Every day | ORAL | 1 refills | Status: DC
  Filled 2021-10-24: qty 30, 30d supply, fill #0

## 2021-10-24 MED ORDER — METFORMIN HCL 1000 MG PO TABS
1000.0000 mg | ORAL_TABLET | Freq: Two times a day (BID) | ORAL | 3 refills | Status: DC
  Filled 2021-10-24: qty 60, 30d supply, fill #0

## 2021-10-24 MED ORDER — GLIPIZIDE 10 MG PO TABS
10.0000 mg | ORAL_TABLET | Freq: Two times a day (BID) | ORAL | 1 refills | Status: DC
  Filled 2021-10-24: qty 60, 30d supply, fill #0

## 2021-10-24 MED ORDER — DICLOFENAC SODIUM 75 MG PO TBEC
75.0000 mg | DELAYED_RELEASE_TABLET | Freq: Two times a day (BID) | ORAL | 0 refills | Status: DC | PRN
  Filled 2021-10-24: qty 60, 30d supply, fill #0

## 2021-10-24 NOTE — Patient Instructions (Signed)
Gripe en los adultos Influenza, Adult A la gripe tambin se la conoce como "influenza". Es una [employer], la nariz y la garganta (vas respiratorias). Se transmite fcilmente de persona a persona (es contagiosa). La gripe causa sntomas que son [employer] de un resfro, junto con fiebre alta y dolores corporales. Cules son las causas? La causa de esta afeccin es el virus de la influenza. Puede contraer el virus de las siguientes maneras: Al inhalar gotitas que quedan en el aire despus de que una persona infectada con gripe tosi o estornud. Al tocar algo que est contaminado con el virus y [employer] mano a la boca, la nariz o los ojos. Qu incrementa el riesgo? Hay ciertas cosas que lo pueden hacer ms propenso a Nurse, adult. Estas incluyen lo siguiente: No lavarse las manos con frecuencia. Tener contacto cercano con [employer] durante la temporada de resfro y gripe. Tocarse la boca, los ojos o la nariz sin antes lavarse las manos. No recibir la [employer]. Puede correr un mayor riesgo de [place] gripe, y [place] graves, como una infeccin pulmonar (neumona), si usted: Es mayor de 64 aos de edad. Est embarazada. Tiene debilitado el sistema que combate las defensas (sistema inmunitario) debido a una enfermedad o a que toma determinados medicamentos. Tiene una afeccin a largo plazo (crnica), como las siguientes: Enfermedad cardaca, renal o pulmonar. Diabetes. Asma. Tiene un trastorno heptico. Tiene mucho sobrepeso (obesidad [place]). Tiene anemia. Cules son los signos o sntomas? Los sntomas normalmente comienzan de repente y [place] [address] y [address]. Pueden incluir los siguientes: Cristy Hilts y [place]. Dolores de [place], dolores en el cuerpo o dolores musculares. Dolor de Investment banker, operational. Tos. Secrecin o congestin nasal. Molestias en el pecho. No querer comer tanto como lo hace normalmente. Sensacin de debilidad o  cansancio. Mareos. Malestar estomacal o vmitos. Cmo se trata? Si la gripe se detecta de forma temprana, puede recibir tratamiento con medicamentos antivirales. Esto puede ayudar a reducir la gravedad y la duracin de la enfermedad. Se los administrarn por boca o a travs de un tubo (catter) intravenoso. Cuidarse en su hogar puede ayudar a que mejoren los sntomas. El mdico puede recomendarle lo siguiente: Tomar medicamentos de Radio broadcast assistant. Beber abundante lquido. La gripe suele desaparecer sola. Si tiene sntomas muy graves u otros problemas, puede recibir tratamiento en un hospital. Siga estas instrucciones en su casa:   Actividad Descanse todo lo que sea necesario. Duerma lo suficiente. Foy [place] en su casa y no concurra al [place] o a la escuela, como se lo haya indicado el mdico. No salga de su casa hasta que no haya tenido fiebre por 24 horas sin tomar medicamentos. Salga de su casa solamente para ir al [employer]. Comida y bebida Wyatt Haste SRO (solucin de rehidratacin oral). Es Ardelia Mems bebida que se vende en farmacias y tiendas. Beba suficiente lquido como para Theatre manager la orina de color amarillo plido. En la medida en que pueda, beba lquidos transparentes en pequeas cantidades. Los lquidos transparentes son, por ejemplo: Grayce Sessions. Trocitos de hielo. Jugo de frutas mezclado con agua. Bebidas deportivas de bajas caloras. Coma alimentos suaves que sean fciles de digerir. En la medida que pueda, consuma pequeas cantidades. Estos alimentos incluyen: Bananas. Pur de [employer]. Arroz. Carnes magras. Tostadas. Galletas. No coma ni beba lo siguiente: Lquidos con alto contenido de azcar o cafena. Alcohol. Alimentos condimentados o con alto contenido de [place]. Indicaciones generales Use los medicamentos de venta libre y los recetados solamente  como se lo haya indicado el mdico. Use un humidificador de aire fro para que el aire de su casa est ms hmedo. Esto puede facilitar  la respiracin. Cuando utilice un humidificador de vapor fro, lmpielo a diario. Vace el agua y [place] por agua limpia. Al toser o estornudar, cbrase la boca y la [place]. Lvese las manos frecuentemente con agua y [place] y durante al menos 20 segundos. Esto tambin es importante despus de toser o Brewing technologist. Si no dispone de [place] y [place], use desinfectante para manos con alcohol. Cumpla con todas las visitas de seguimiento. Cmo se previene?  Colquese la vacuna antigripal todos los [place]. Puede colocarse la vacuna contra la gripe a fines de verano, en otoo o en invierno. Pregntele al mdico cundo debe aplicarse la vacuna contra la gripe. Evite el contacto con personas que estn enfermas durante el otoo y el invierno. Es la temporada del resfro y Counsellor. Comunquese con un mdico si: Tiene sntomas nuevos. Tiene los siguientes sntomas: Dolor de [place]. Materia fecal lquida (diarrea). Cristy Hilts. La tos empeora. Empieza a tener ms mucosidad. Tiene Higher education careers adviser. Vomita. Solicite ayuda de inmediato si: Le falta el aire. Tiene dificultad para respirar. La piel o las uas se ponen de un color azulado. Presenta dolor muy intenso o rigidez en el cuello. Tiene dolor de cabeza repentino. Le duele la cara o el odo de forma repentina. No puede comer ni beber sin vomitar. Estos sntomas pueden representar un problema grave que constituye Engineer, maintenance (IT). Solicite atencin mdica de inmediato. Comunquese con el servicio de emergencias de su localidad (911 en los [place]). No espere a ver si los sntomas desaparecen. No conduzca por sus propios medios [employer]. Resumen A la gripe tambin se la conoce como "influenza". Es una [employer], la nariz y Patent examiner. Se transmite fcilmente de [place] persona a otra. Use los medicamentos de venta libre y los recetados solamente como se lo haya indicado el mdico. Aplicarse la vacuna contra la gripe todos los  aos es la mejor manera de no contagiarse la gripe. Esta informacin no tiene Marine scientist el consejo del mdico. Asegrese de hacerle al mdico cualquier pregunta que tenga. Document Revised: 09/27/2020 Document Reviewed: 09/27/2020 Elsevier Patient Education  2022 [employer].

## 2021-10-24 NOTE — Progress Notes (Signed)
[place]  .a  Subjective:  Patient ID: Cheryl Massey, female    DOB: May 09, 1956  Age: 65 y.o. MRN: [id number]  CC: Diabetes   HPI Cheryl Massey 65 year old  Hispanic (interpreter Cheryl Massey [phone]) female presents for follow-up of diabetes. Patient does check blood sugar at home  Compliant with meds - Yes Checking CBGs? Yes  Fasting avg - 120-150   Postprandial average -  Exercising regularly? - Yes Watching carbohydrate intake? - Yes Neuropathy ? - No Hypoglycemic events - No  - Recovers with :   Pertinent ROS:  Polyuria - No Polydipsia - No Vision problems - No Hypertension , she checks at home ranges systolic around 846 and diastolic doesn't remember.    Denies shortness of breath, headaches, chest pain or lower extremity edema  Medications as noted below. Taking them regularly without complication/adverse reaction being reported today.   History Cheryl Massey has a past medical history of Arthritis, Diabetes mellitus without complication ([place]), Hypertension, and Stroke ([place]) (03/28/2019).   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   Her family history includes Hypertension in her father and mother.She reports that she has never smoked. She has never used smokeless tobacco. She reports that she does not drink alcohol and does not use drugs.  Current Outpatient Medications on File Prior to Visit  Medication Sig Dispense Refill   aspirin EC 81 MG tablet Take 1 tablet (81 mg total) by mouth 2 (two) times daily. To be taken after surgery 84 tablet 0   docusate sodium (COLACE) 100 MG capsule Take 1 capsule (100 mg total) by mouth daily as needed. 30 capsule 2   lisinopril (ZESTRIL) 5 MG tablet Take 1 tablet (5 mg total) by mouth daily. 90 tablet 3   traMADol (ULTRAM) 50 MG  tablet Take 1 tablet (50 mg total) by mouth 2 (two) times daily between meals as needed. 30 tablet 2   oxyCODONE-acetaminophen (PERCOCET) 5-325 MG tablet Take 1-2 tablets by mouth every 8 (eight) hours as needed. (Patient not taking: Reported on 10/24/2021) 40 tablet 0   No current facility-administered medications on file prior to visit.    ROS Comprehensive ROS pertinent positive and negative noted in HPI  Objective:  BP (!) 147/83 (BP Location: Right Arm, Patient Position: Sitting, Cuff Size: Normal)   Pulse (!) 55   Temp (!) 97.3 F (36.3 C) (Temporal)   Wt 150 lb 9.6 oz (68.3 kg)   SpO2 99%   BMI 26.68 kg/m   BP Readings from Last 3 Encounters:  10/24/21 (!) 147/83  05/23/21 135/74  04/23/21 128/75    Wt Readings from Last 3 Encounters:  10/24/21 150 lb 9.6 oz (68.3 kg)  05/23/21 144 lb 6.4 oz (65.5 kg)  04/23/21 144 lb (65.3 kg)   Physical exam: General: Vital signs reviewed.  Patient is well-developed and well-nourished, xx in no acute distress and cooperative with exam. Head: Normocephalic and atraumatic. Eyes: EOMI, conjunctivae normal, no scleral icterus. Neck: Supple, trachea midline, normal ROM, no JVD, masses, thyromegaly, or carotid bruit present. Cardiovascular: RRR, S1 normal, S2 normal, no murmurs, gallops, or rubs. Pulmonary/Chest: Clear to auscultation bilaterally, no wheezes, rales, or rhonchi. Abdominal: Soft, non-tender, non-distended, BS +, no masses, organomegaly, or guarding present. Musculoskeletal: No joint deformities, erythema, or stiffness,Left shoulder crepitus right shoulder full ROM without pain Extremities: No lower  extremity edema bilaterally,  pulses symmetric and intact bilaterally. No cyanosis or clubbing. Neurological: A&O x3, Strength is normal Skin: Warm, dry and intact. No rashes or erythema. Psychiatric: Normal mood and affect. speech and behavior is normal. Cognition and memory are normal.    Lab Results  Component Value Date    HGBA1C 8.4 (A) 10/24/2021   HGBA1C 6.5 (A) 04/23/2021   HGBA1C 7.6 (A) 01/24/2021    Lab Results  Component Value Date   WBC 4.4 10/24/2021   HGB 13.1 10/24/2021   HCT 39.1 10/24/2021   PLT 258 10/24/2021   GLUCOSE 118 (H) 10/24/2021   CHOL 220 (H) 10/24/2021   TRIG 247 (H) 10/24/2021   HDL 52 10/24/2021   LDLDIRECT 92.9 03/29/2019   LDLCALC 125 (H) 10/24/2021   ALT 21 10/24/2021   AST 19 10/24/2021   NA 141 10/24/2021   K 5.2 10/24/2021   CL 104 10/24/2021   CREATININE 0.88 10/24/2021   BUN 11 10/24/2021   CO2 24 10/24/2021   TSH 1.136 03/29/2019   INR 1.0 03/07/2021   HGBA1C 8.4 (A) 10/24/2021     Assessment & Plan:  Cheryl Massey was seen today for diabetes.  Diagnoses and all orders for this visit:  Essential hypertension Counseled on blood pressure goal of less than 130/80, low-sodium, DASH diet, medication compliance, 150 minutes of moderate intensity exercise per week. Discussed medication compliance, adverse effects.  -     CMP14+EGFR  Type 2 diabetes mellitus treated without insulin (HCC) Goal  of therapy met at Less than 6.5 hemoglobin A1c. Monitor foods that are high in carbohydrates are the following rice, potatoes, breads, sugars, and pastas.  Reduction in the intake (eating) will assist in lowering your blood sugars.  -     metFORMIN (GLUCOPHAGE) 1000 MG tablet; Take 1 tablet (1,000 mg total) by mouth 2 (two) times daily with a meal. -     sitaGLIPtin (JANUVIA) 100 MG tablet; TAKE 1 TABLET (100 MG TOTAL) BY MOUTH DAILY. -     CBC with Differential  Colon cancer screening FOBT  Deficiency anemia -     CBC with Differential  Osteoarthritis of left shoulder, unspecified osteoarthritis type diclofenac (VOLTAREN) 75 MG EC tablet; Take 1 tablet (75 mg total) by mouth 2 (two) times daily between meals as needed. -     Ambulatory referral to Orthopedic Surgery  Comprehensive diabetic foot examination, type 2 DM, encounter for [facility] Completed   Medication  refill -     atorvastatin (LIPITOR) 20 MG tablet; Take 1 tablet (20 mg total) by mouth daily. -     sitaGLIPtin (JANUVIA) 100 MG tablet; TAKE 1 TABLET (100 MG TOTAL) BY MOUTH DAILY.  Mixed hyperlipidemia Your cholesterol higher than expected. High cholesterol may increase risk of heart attack and/or stroke. Consider eating more fruits, vegetables, and lean baked meats such as chicken or fish. Moderate intensity exercise at least 150 minutes as tolerated per week may help as well.   -     atorvastatin (LIPITOR) 20 MG tablet; Take 1 tablet (20 mg total) by mouth daily. -     Lipid Panel  Other orders -     diclofenac (VOLTAREN) 75 MG EC tablet; Take 1 tablet (75 mg total) by mouth 2 (two) times daily between meals as needed.    Follow-up:   Return in about 3 months (around 01/24/2022) for DM f/u .  The above assessment and management plan was discussed with the patient. The patient verbalized understanding  of and has agreed to the management plan. Patient is aware to call the clinic if symptoms fail to improve or worsen. Patient is aware when to return to the clinic for a follow-up visit. Patient educated on when it is appropriate to go to the emergency department.   Juluis Mire, NP-C

## 2021-10-25 LAB — CMP14+EGFR
ALT: 21 IU/L (ref 0–32)
AST: 19 IU/L (ref 0–40)
Albumin/Globulin Ratio: 2.1 (ref 1.2–2.2)
Albumin: 4.6 g/dL (ref 3.8–4.8)
Alkaline Phosphatase: 81 IU/L (ref 44–121)
BUN/Creatinine Ratio: 13 (ref 12–28)
BUN: 11 mg/dL (ref 8–27)
Bilirubin Total: <0.2 mg/dL (ref 0.0–1.2)
CO2: 24 mmol/L (ref 20–29)
Calcium: 9.6 mg/dL (ref 8.7–10.3)
Chloride: 104 mmol/L (ref 96–106)
Creatinine, Ser: 0.88 mg/dL (ref 0.57–1.00)
Globulin, Total: 2.2 g/dL (ref 1.5–4.5)
Glucose: 118 mg/dL — ABNORMAL HIGH (ref 70–99)
Potassium: 5.2 mmol/L (ref 3.5–5.2)
Sodium: 141 mmol/L (ref 134–144)
Total Protein: 6.8 g/dL (ref 6.0–8.5)
eGFR: 73 mL/min/{1.73_m2} (ref >59)

## 2021-10-25 LAB — CBC WITH DIFFERENTIAL/PLATELET
Basophils Absolute: 0 10*3/uL (ref 0.0–0.2)
Basos: 1 %
EOS (ABSOLUTE): 0.2 10*3/uL (ref 0.0–0.4)
Eos: 3 %
Hematocrit: 39.1 % (ref 34.0–46.6)
Hemoglobin: 13.1 g/dL (ref 11.1–15.9)
Immature Grans (Abs): 0 10*3/uL (ref 0.0–0.1)
Immature Granulocytes: 0 %
Lymphocytes Absolute: 1.6 10*3/uL (ref 0.7–3.1)
Lymphs: 38 %
MCH: 30.1 pg (ref 26.6–33.0)
MCHC: 33.5 g/dL (ref 31.5–35.7)
MCV: 90 fL (ref 79–97)
Monocytes Absolute: 0.6 10*3/uL (ref 0.1–0.9)
Monocytes: 13 %
Neutrophils Absolute: 2 10*3/uL (ref 1.4–7.0)
Neutrophils: 45 %
Platelets: 258 10*3/uL (ref 150–450)
RBC: 4.35 x10E6/uL (ref 3.77–5.28)
RDW: 13.6 % (ref 11.7–15.4)
WBC: 4.4 10*3/uL (ref 3.4–10.8)

## 2021-10-25 LAB — LIPID PANEL
Chol/HDL Ratio: 4.2 ratio (ref 0.0–4.4)
Cholesterol, Total: 220 mg/dL — ABNORMAL HIGH (ref 100–199)
HDL: 52 mg/dL (ref >39)
LDL Chol Calc (NIH): 125 mg/dL — ABNORMAL HIGH (ref 0–99)
Triglycerides: 247 mg/dL — ABNORMAL HIGH (ref 0–149)
VLDL Cholesterol Cal: 43 mg/dL — ABNORMAL HIGH (ref 5–40)

## 2021-10-28 ENCOUNTER — Other Ambulatory Visit: Payer: Self-pay

## 2021-10-28 ENCOUNTER — Other Ambulatory Visit (INDEPENDENT_AMBULATORY_CARE_PROVIDER_SITE_OTHER): Payer: Self-pay | Admitting: Primary Care

## 2021-10-28 DIAGNOSIS — E782 Mixed hyperlipidemia: Secondary | ICD-10-CM

## 2021-10-28 MED ORDER — ATORVASTATIN CALCIUM 40 MG PO TABS
40.0000 mg | ORAL_TABLET | Freq: Every day | ORAL | 3 refills | Status: DC
Start: 2021-10-28 — End: 2022-05-15
  Filled 2021-10-28: qty 30, 30d supply, fill #0

## 2021-11-01 ENCOUNTER — Telehealth (INDEPENDENT_AMBULATORY_CARE_PROVIDER_SITE_OTHER): Payer: Self-pay

## 2021-11-01 NOTE — Telephone Encounter (Signed)
Call placed to patient using pacific interpreter [phone]) due to patient being unable to speak due to stroke results provided to her daughter. She verbalized understanding. Nat Christen, CMA

## 2021-11-01 NOTE — Telephone Encounter (Signed)
-----   Message from Kerin Perna, NP sent at 10/28/2021  3:26 PM EST ----- Your cholesterol is high, Increase risk of heart attack and/or stroke.  To reduce your Cholesterol , Remember - more fruits and vegetables, more fish, and limit red meat and dairy products. More soy, nuts, beans, barley, lentils, oats and plant sterol ester enriched margarine instead of butter. I also encourage eliminating sugar and processed food. Take 2 atorvastatin 20 (2) until gone. New script sent for atorvastatin 40mg

## 2021-11-04 ENCOUNTER — Other Ambulatory Visit: Payer: Self-pay

## 2021-11-05 ENCOUNTER — Other Ambulatory Visit: Payer: Self-pay

## 2021-11-05 ENCOUNTER — Encounter: Payer: Self-pay | Admitting: Orthopaedic Surgery

## 2021-11-05 ENCOUNTER — Ambulatory Visit (INDEPENDENT_AMBULATORY_CARE_PROVIDER_SITE_OTHER): Payer: Self-pay

## 2021-11-05 ENCOUNTER — Ambulatory Visit (INDEPENDENT_AMBULATORY_CARE_PROVIDER_SITE_OTHER): Payer: Self-pay | Admitting: Orthopaedic Surgery

## 2021-11-05 DIAGNOSIS — M25512 Pain in left shoulder: Secondary | ICD-10-CM

## 2021-11-05 DIAGNOSIS — G8929 Other chronic pain: Secondary | ICD-10-CM

## 2021-11-05 DIAGNOSIS — Z96642 Presence of left artificial hip joint: Secondary | ICD-10-CM

## 2021-11-05 MED ORDER — BUPIVACAINE HCL 0.25 % IJ SOLN
2.0000 mL | INTRAMUSCULAR | Status: AC | PRN
  Administered 2021-11-05: 2 mL via INTRA_ARTICULAR

## 2021-11-05 MED ORDER — METHYLPREDNISOLONE ACETATE 40 MG/ML IJ SUSP
40.0000 mg | INTRAMUSCULAR | Status: AC | PRN
  Administered 2021-11-05: 40 mg via INTRA_ARTICULAR

## 2021-11-05 MED ORDER — LIDOCAINE HCL 2 % IJ SOLN
2.0000 mL | INTRAMUSCULAR | Status: AC | PRN
Start: 2021-11-05 — End: 2021-11-05
  Administered 2021-11-05: 2 mL

## 2021-11-05 NOTE — Progress Notes (Signed)
Office Visit Note   Patient: Cheryl Massey           Date of Birth: October 07, 1956           MRN: [id number] Visit Date: 11/05/2021              Requested by: Kerin Perna, NP [address] [place],  [place] [address] PCP: Kerin Perna, NP   Assessment & Plan: Visit Diagnoses:  1. History of total hip replacement, left   2. Status post total replacement of left hip   3. Chronic left shoulder pain     Plan: Impression is 50-month status post left total hip replacement doing well and left shoulder impingement syndrome with subacromial bursitis.  In regards to the hip, she will continue to advance activity as tolerated.  Dental prophylaxis reinforced.  Follow-up with [place] in 4 months time for repeat evaluation AP pelvis x-rays.  In regards to the shoulder, I discussed subacromial cortisone injection today for which she would like to proceed.  If her symptoms do not improve over the next several weeks she will call and let us know.  This was all discussed through Spanish-speaking interpreter.  Follow-Up Instructions: Return in about 4 months (around 03/05/2022).   Orders:  Orders Placed This Encounter  Procedures   XR Pelvis 1-2 Views   XR Shoulder Left   No orders of the defined types were placed in this encounter.     Procedures: Large Joint Inj: L subacromial bursa on 11/05/2021 4:13 PM Indications: pain Details: 22 G needle Medications: 2 mL lidocaine 2 %; 2 mL bupivacaine 0.25 %; 40 mg methylPREDNISolone acetate 40 MG/ML Outcome: tolerated well, no immediate complications Patient was prepped and draped in the usual sterile fashion.      Clinical Data: No additional findings.   Subjective: Chief Complaint  Patient presents with   Left Shoulder - Pain   Left Hip - Pain    HPI patient is [name] who is here today for follow-up of her left total hip replacement, 03/11/2021 in addition to new onset left  shoulder pain.  In regards to her hip, she is doing well.  No complaints.  Full range of motion.  Regards to the left shoulder, she has had pain for the past 3 months without any known injury or change in activity.  The pain is worsened over the past 2 weeks.  The pain is to the anterior and posterior shoulder and additionally into the deltoid.  Pain is worse with sleeping on the left side as well as when she forward flexes her shoulder.  She has been taking Voltaren with little relief.  No previous cortisone injection to the shoulder.  Review of Systems as detailed in HPI.  All others reviewed and are negative.   Objective: Vital Signs: There were no vitals taken for this visit.  Physical Exam well-developed well-nourished female in no acute distress.  Alert and oriented x3.  Ortho Exam left shoulder exam shows forward flexion to approximate 150 degrees.  Internal rotation to L5.  She does have moderate tenderness to the [facility] joint.  Positive cross body adduction.  Positive empty can.  Full strength throughout.  She is neurovascular intact distally.  Left hip exam she is painless logroll.  Full strength throughout.  She is neurovascular tact distally.  Specialty Comments:  No specialty comments available.  Imaging: XR Pelvis 1-2 Views  Result Date: 11/05/2021 Well-seated prosthesis without complication  XR Shoulder Left  Result Date: 11/05/2021 No acute or structural abnormalities    PMFS History: Patient Active Problem List   Diagnosis Date Noted   Primary osteoarthritis of left knee 04/24/2021   Status post total replacement of left hip 03/11/2021   Primary osteoarthritis of left hip 10/05/2020   CVA (cerebral vascular accident) ([place]) 10/11/2019   CVA (cerebrovascular accident) ([place]) 10/11/2019   TIA (transient ischemic attack) 03/28/2019   Essential hypertension 03/28/2019   Type 2 diabetes mellitus treated without insulin ([place]) 03/28/2019   Past Medical History:  Diagnosis  Date   Arthritis    hands   Diabetes mellitus without complication ([place])    Hypertension    Stroke ([place]) 03/28/2019    Family History  Problem Relation Age of Onset   Hypertension Mother    Hypertension Father     Past Surgical History:  Procedure Laterality Date   ABDOMINAL HYSTERECTOMY     APPENDECTOMY     arthroplasty Right 2005   TOTAL HIP ARTHROPLASTY Left 03/11/2021   TOTAL HIP ARTHROPLASTY Left 03/11/2021   Procedure: LEFT TOTAL HIP ARTHROPLASTY ANTERIOR APPROACH;  Surgeon: Leandrew Koyanagi, MD;  Location: [facility];  Service: Orthopedics;  Laterality: Left;   Social History   Occupational History   Not on file  Tobacco Use   Smoking status: Never   Smokeless tobacco: Never  Vaping Use   Vaping Use: Never used  Substance and Sexual Activity   Alcohol use: No   Drug use: No   Sexual activity: Yes    Birth control/protection: Surgical

## 2022-01-24 ENCOUNTER — Ambulatory Visit (INDEPENDENT_AMBULATORY_CARE_PROVIDER_SITE_OTHER): Payer: Self-pay | Admitting: Primary Care

## 2022-02-05 ENCOUNTER — Encounter (INDEPENDENT_AMBULATORY_CARE_PROVIDER_SITE_OTHER): Payer: Self-pay | Admitting: Primary Care

## 2022-02-05 ENCOUNTER — Other Ambulatory Visit: Payer: Self-pay

## 2022-02-05 ENCOUNTER — Ambulatory Visit (INDEPENDENT_AMBULATORY_CARE_PROVIDER_SITE_OTHER): Payer: Self-pay | Admitting: Primary Care

## 2022-02-05 VITALS — BP 152/81 | HR 66 | Temp 98.3°F | Ht 63.0 in | Wt 149.6 lb

## 2022-02-05 DIAGNOSIS — E119 Type 2 diabetes mellitus without complications: Secondary | ICD-10-CM

## 2022-02-05 DIAGNOSIS — I10 Essential (primary) hypertension: Secondary | ICD-10-CM

## 2022-02-05 DIAGNOSIS — R0981 Nasal congestion: Secondary | ICD-10-CM

## 2022-02-05 DIAGNOSIS — Z760 Encounter for issue of repeat prescription: Secondary | ICD-10-CM

## 2022-02-05 DIAGNOSIS — M24812 Other specific joint derangements of left shoulder, not elsewhere classified: Secondary | ICD-10-CM

## 2022-02-05 LAB — POCT GLYCOSYLATED HEMOGLOBIN (HGB A1C): Hemoglobin A1C: 10.3 % — AB (ref 4.0–5.6)

## 2022-02-05 MED ORDER — GLIPIZIDE 10 MG PO TABS
10.0000 mg | ORAL_TABLET | Freq: Two times a day (BID) | ORAL | 1 refills | Status: DC
  Filled 2022-02-05: qty 60, 30d supply, fill #0
  Filled 2022-04-14: qty 60, 30d supply, fill #1

## 2022-02-05 MED ORDER — LEVOCETIRIZINE DIHYDROCHLORIDE 5 MG PO TABS
5.0000 mg | ORAL_TABLET | Freq: Every evening | ORAL | 0 refills | Status: DC
  Filled 2022-02-05: qty 30, 30d supply, fill #0
  Filled 2022-03-17: qty 30, 30d supply, fill #1
  Filled 2022-04-14: qty 30, 30d supply, fill #2

## 2022-02-05 MED ORDER — SITAGLIPTIN PHOSPHATE 100 MG PO TABS
ORAL_TABLET | Freq: Every day | ORAL | 1 refills | Status: DC
  Filled 2022-02-05: qty 30, 30d supply, fill #0
  Filled 2022-03-17: qty 30, 30d supply, fill #1
  Filled 2022-04-18: qty 20, 20d supply, fill #2

## 2022-02-05 MED ORDER — FLUTICASONE PROPIONATE 50 MCG/ACT NA SUSP
2.0000 | Freq: Every day | NASAL | 6 refills | Status: DC
  Filled 2022-02-05: qty 16, 30d supply, fill #0
  Filled 2022-05-05 – 2022-05-13 (×2): qty 16, 30d supply, fill #1

## 2022-02-05 MED ORDER — METFORMIN HCL 1000 MG PO TABS
1000.0000 mg | ORAL_TABLET | Freq: Two times a day (BID) | ORAL | 3 refills | Status: DC
  Filled 2022-02-05: qty 60, 30d supply, fill #0
  Filled 2022-04-18: qty 60, 30d supply, fill #1

## 2022-02-05 MED ORDER — LISINOPRIL 20 MG PO TABS
20.0000 mg | ORAL_TABLET | Freq: Every day | ORAL | 1 refills | Status: DC
  Filled 2022-02-05: qty 30, 30d supply, fill #0
  Filled 2022-03-17: qty 30, 30d supply, fill #1
  Filled 2022-04-18: qty 30, 30d supply, fill #2
  Filled 2022-05-19: qty 90, 90d supply, fill #3

## 2022-02-05 NOTE — Progress Notes (Signed)
[name], is a 66 y.o. female  IRW:[id number]  PYP:[id number]  DOB - 08/26/1956  Chief Complaint  Patient presents with   Diabetes       Subjective:   Cheryl Massey is a 66 y.o. Hispanic female( interpreter Pamella Pert [phone] ) here today for a follow up visit for the management of HTN- Denies shortness of breath, headaches, chest pain or lower extremity edema. Type 2 diabetes   Follow-up of diabetes. Patient does not check blood sugar at home  Compliant with meds - No Checking CBGs? No  Fasting avg -   Postprandial average -  Exercising regularly? - Yes Watching carbohydrate intake? - No Neuropathy ? - No Hypoglycemic events - No  - Recovers with :   Pertinent ROS:  Polyuria - No Polydipsia - No Vision problems - NoPatient has No headache, No chest pain, No abdominal pain - No Nausea, No new weakness tingling or numbness, No Cough - SOB.  No problems updated.  ALLERGIES: No Known Allergies  PAST MEDICAL HISTORY: Past Medical History:  Diagnosis Date   Arthritis    hands   Diabetes mellitus without complication ([place])    Hypertension    Stroke ([place]) 03/28/2019    MEDICATIONS AT HOME: Prior to Admission medications   Medication Sig Start Date End Date Taking? Authorizing Provider  aspirin EC 81 MG tablet Take 1 tablet (81 mg total) by mouth 2 (two) times daily. To be taken after surgery 03/07/21   Aundra Dubin, PA-C  atorvastatin (LIPITOR) 40 MG tablet Take 1 tablet (40 mg total) by mouth daily. 10/28/21   Kerin Perna, NP  docusate sodium (COLACE) 100 MG capsule Take 1 capsule (100 mg total) by mouth daily as needed. 03/07/21 03/07/22  Aundra Dubin, PA-C  glipiZIDE (GLUCOTROL) 10 MG tablet Take 1 tablet (10 mg total) by mouth 2 (two) times daily before a meal. 10/24/21   Kerin Perna, NP  lisinopril (ZESTRIL) 5 MG tablet Take 1 tablet (5 mg total) by mouth daily. Patient not taking: Reported on  02/05/2022 04/23/21   Kerin Perna, NP  metFORMIN (GLUCOPHAGE) 1000 MG tablet Take 1 tablet (1,000 mg total) by mouth 2 (two) times daily with a meal. 10/24/21   Kerin Perna, NP  sitaGLIPtin (JANUVIA) 100 MG tablet TAKE 1 TABLET (100 MG TOTAL) BY MOUTH DAILY. 10/24/21 10/24/22  Kerin Perna, NP    Objective:   Vitals:   02/05/22 1604  BP: (!) 152/81  Pulse: 66  Temp: 98.3 F (36.8 C)  TempSrc: Oral  SpO2: 98%  Weight: 149 lb 9.6 oz (67.9 kg)  Height: 5\' 3"  (1.6 m)   Exam General appearance : Awake, alert, not in any distress. Speech Clear. Not toxic looking HEENT: Atraumatic and Normocephalic, pupils equally reactive to light and accomodation Neck: Supple, no JVD. No cervical lymphadenopathy.  Chest: Good air entry bilaterally, no added sounds  CVS: S1 S2 regular, no murmurs.  Abdomen: Bowel sounds present, Non tender and not distended with no gaurding, rigidity or rebound. Extremities:right shoulder pain -crepitus . B/L Lower Ext shows no edema, both legs are warm to touch Neurology: Awake alert, and oriented X 3, CN II-XII intact, Non focal Skin: No Rash  Data Review Lab Results  Component Value Date   HGBA1C 10.3 (A) 02/05/2022   HGBA1C 8.4 (A) 10/24/2021   HGBA1C 6.5 (A) 04/23/2021    Assessment & Plan  Cheryl Massey was seen today for  diabetes.  Diagnoses and all orders for this visit:  Type 2 diabetes mellitus treated without insulin (HCC) -     HgB A1c 10.2 Non compliant last pick up medication 11/22 Monitor foods that are high in carbohydrates are the following rice, potatoes, breads, sugars, and pastas.  Reduction in the intake (eating) will assist in lowering your blood sugars.  -     metFORMIN (GLUCOPHAGE) 1000 MG tablet; Take 1 tablet (1,000 mg total) by mouth 2 (two) times daily with a meal. -     sitaGLIPtin (JANUVIA) 100 MG tablet; TAKE 1 TABLET (100 MG TOTAL) BY MOUTH DAILY.  Essential hypertension BP goal - < 130/80 Explained that having  normal blood pressure is the goal and medications are helping to get to goal and maintain normal blood pressure. DIET: Limit salt intake, read nutrition labels to check salt content, limit fried and high fatty foods  Avoid using multisymptom OTC cold preparations that generally contain sudafed which can rise BP. Consult with pharmacist on best cold relief products to use for persons with HTN EXERCISE Discussed incorporating exercise such as walking - 30 minutes most days of the week and can do in 10 minute intervals    -     lisinopril (ZESTRIL) 20 MG tablet; Take 1 tablet (20 mg total) by mouth daily.  Medication refill -     lisinopril (ZESTRIL) 20 MG tablet; Take 1 tablet (20 mg total) by mouth daily. -     sitaGLIPtin (JANUVIA) 100 MG tablet; TAKE 1 TABLET (100 MG TOTAL) BY MOUTH DAILY.  glipiZIDE (GLUCOTROL) 10 MG tablet; Take 1 tablet (10 mg total) by mouth 2 (two) times daily before a meal.  Sinus congestion -     fluticasone (FLONASE) 50 MCG/ACT nasal spray; Place 2 sprays into both nostrils daily. -     levocetirizine (XYZAL) 5 MG tablet; Take 1 tablet (5 mg total) by mouth every evening.  Crepitus of left shoulder joint -     AMB referral to orthopedics    Patient have been counseled extensively about nutrition and exercise. Other issues discussed during this visit include: low cholesterol diet, weight control and daily exercise, foot care, annual eye examinations at Ophthalmology, importance of adherence with medications and regular follow-up. We also discussed long term complications of uncontrolled diabetes and hypertension.   No follow-ups on file.  The patient was given clear instructions to go to ER or return to medical center if symptoms don't improve, worsen or new problems develop. The patient verbalized understanding. The patient was told to call to get lab results if they haven't heard anything in the next week.   This note has been created with Biomedical engineer. Any transcriptional errors are unintentional.   Kerin Perna, NP 02/05/2022, 4:17 PM

## 2022-02-18 ENCOUNTER — Ambulatory Visit (INDEPENDENT_AMBULATORY_CARE_PROVIDER_SITE_OTHER): Payer: Self-pay | Admitting: Orthopaedic Surgery

## 2022-02-18 ENCOUNTER — Other Ambulatory Visit: Payer: Self-pay

## 2022-02-18 ENCOUNTER — Encounter: Payer: Self-pay | Admitting: Orthopaedic Surgery

## 2022-02-18 ENCOUNTER — Ambulatory Visit (INDEPENDENT_AMBULATORY_CARE_PROVIDER_SITE_OTHER): Payer: Self-pay

## 2022-02-18 DIAGNOSIS — Z96642 Presence of left artificial hip joint: Secondary | ICD-10-CM

## 2022-02-18 DIAGNOSIS — G8929 Other chronic pain: Secondary | ICD-10-CM

## 2022-02-18 DIAGNOSIS — M25512 Pain in left shoulder: Secondary | ICD-10-CM

## 2022-02-18 NOTE — Progress Notes (Signed)
? ?Office Visit Note ?  ?Patient: Cheryl Massey           ?Date of Birth: Feb 20, 1956           ?MRN: [id number] ?Visit Date: 02/18/2022 ?             ?Requested by: Kerin Perna, NP ?[address] ?[place],  [place] [address] ?PCP: Kerin Perna, NP ? ? ?Assessment & Plan: ?Visit Diagnoses:  ?1. History of total hip replacement, left   ?2. Chronic left shoulder pain   ? ? ?Plan: Cheryl Massey comes back today for follow-up of left total hip replacement and chronic left shoulder pain.  She is 1 year status post left total hip replacement.  She is very happy overall and has done very well.  Her left shoulder continues to bother her.  Subacromial injection has not helped. ? ?Examination of the left hip shows a fully healed surgical scar.  She has good gait and balance.  Good range of motion of the hip without pain. ?Examination of the left shoulder shows pain with testing of the rotator cuff and impingement. ? ?In regards to the left hip she has done very well and very happy.  She can follow-up in another year for this.  For the left shoulder due to continued pain despite subacromial injection and concern for rotator cuff pathology we will obtain MRI of the left shoulder.  Follow-up after the MRI.  Interpreter present today. ? ? ?Follow-Up Instructions: No follow-ups on file.  ? ?Orders:  ?Orders Placed This Encounter  ?Procedures  ? XR Pelvis 1-2 Views  ? MR Shoulder Left w/o contrast  ? ?No orders of the defined types were placed in this encounter. ? ? ? ? Procedures: ?No procedures performed ? ? ?Clinical Data: ?No additional findings. ? ? ?Subjective: ?Chief Complaint  ?Patient presents with  ? Left Hip - Follow-up  ?  03/11/2021 total hip arthroplasty  ? Left Shoulder - Follow-up  ? ? ?HPI ? ?Review of Systems ? ? ?Objective: ?Vital Signs: There were no vitals taken for this visit. ? ?Physical Exam ? ?Ortho Exam ? ?Specialty Comments:  ?No specialty comments available. ? ?Imaging: ?XR Pelvis 1-2  Views ? ?Result Date: 02/18/2022 ?Stable total hip replacement without complications  ? ? ?PMFS History: ?Patient Active Problem List  ? Diagnosis Date Noted  ? Primary osteoarthritis of left knee 04/24/2021  ? Status post total replacement of left hip 03/11/2021  ? Primary osteoarthritis of left hip 10/05/2020  ? CVA (cerebral vascular accident) ([place]) 10/11/2019  ? CVA (cerebrovascular accident) ([place]) 10/11/2019  ? TIA (transient ischemic attack) 03/28/2019  ? Essential hypertension 03/28/2019  ? Type 2 diabetes mellitus treated without insulin ([place]) 03/28/2019  ? ?Past Medical History:  ?Diagnosis Date  ? Arthritis   ? hands  ? Diabetes mellitus without complication ([place])   ? Hypertension   ? Stroke ([place]) 03/28/2019  ?  ?Family History  ?Problem Relation Age of Onset  ? Hypertension Mother   ? Hypertension Father   ?  ?Past Surgical History:  ?Procedure Laterality Date  ? ABDOMINAL HYSTERECTOMY    ? APPENDECTOMY    ? arthroplasty Right 2005  ? TOTAL HIP ARTHROPLASTY Left 03/11/2021  ? TOTAL HIP ARTHROPLASTY Left 03/11/2021  ? Procedure: LEFT TOTAL HIP ARTHROPLASTY ANTERIOR APPROACH;  Surgeon: Leandrew Koyanagi, MD;  Location: [facility];  Service: Orthopedics;  Laterality: Left;  ? ?Social History  ? ?Occupational History  ? Not  on file  ?Tobacco Use  ? Smoking status: Never  ? Smokeless tobacco: Never  ?Vaping Use  ? Vaping Use: Never used  ?Substance and Sexual Activity  ? Alcohol use: No  ? Drug use: No  ? Sexual activity: Yes  ?  Birth control/protection: Surgical  ? ? ? ? ? ? ?

## 2022-03-02 ENCOUNTER — Other Ambulatory Visit: Payer: Self-pay

## 2022-03-02 ENCOUNTER — Ambulatory Visit
Admission: RE | Admit: 2022-03-02 | Discharge: 2022-03-02 | Disposition: A | Discharge status: Home / Self Care | Payer: No Typology Code available for payment source | Source: Ambulatory Visit | Attending: Orthopaedic Surgery | Admitting: Orthopaedic Surgery

## 2022-03-02 DIAGNOSIS — G8929 Other chronic pain: Secondary | ICD-10-CM

## 2022-03-04 ENCOUNTER — Ambulatory Visit: Payer: Self-pay | Admitting: Orthopaedic Surgery

## 2022-03-11 ENCOUNTER — Other Ambulatory Visit: Payer: Self-pay

## 2022-03-11 ENCOUNTER — Ambulatory Visit (INDEPENDENT_AMBULATORY_CARE_PROVIDER_SITE_OTHER): Payer: Self-pay

## 2022-03-11 ENCOUNTER — Ambulatory Visit (INDEPENDENT_AMBULATORY_CARE_PROVIDER_SITE_OTHER): Payer: Self-pay | Admitting: Orthopaedic Surgery

## 2022-03-11 DIAGNOSIS — Z96642 Presence of left artificial hip joint: Secondary | ICD-10-CM

## 2022-03-11 DIAGNOSIS — M67922 Unspecified disorder of synovium and tendon, left upper arm: Secondary | ICD-10-CM

## 2022-03-11 DIAGNOSIS — M75112 Incomplete rotator cuff tear or rupture of left shoulder, not specified as traumatic: Secondary | ICD-10-CM

## 2022-03-11 DIAGNOSIS — M19012 Primary osteoarthritis, left shoulder: Secondary | ICD-10-CM

## 2022-03-11 NOTE — Progress Notes (Signed)
? ?Office Visit Note ?  ?Patient: Cheryl Massey [name]           ?Date of Birth: 09/25/1956           ?MRN: [id number] ?Visit Date: 03/11/2022 ?             ?Requested by: Kerin Perna, NP ?[id number] ?[place],  [place] [id number] ?PCP: Kerin Perna, NP ? ? ?Assessment & Plan: ?Visit Diagnoses:  ?1. Status post total replacement of left hip   ?2. Nontraumatic incomplete tear of left rotator cuff   ?3. Arthritis of left acromioclavicular joint   ?4. Tendinopathy of left biceps   ? ? ?Plan: In regards to the hip replacement she has done very well and very happy.  Dental prophylaxis reinforced.  We will see her back in another year for a standing AP pelvis x-rays.  In regards to left shoulder she has now had cortisone injections that did not provide much relief.  MRI findings were reviewed with the patient which shows diffuse tendinosis of the rotator cuff.  Medial subluxation of the biceps tendon.  Arthritic changes within the [facility] joint.  There is moderate chondromalacia of the glenohumeral surfaces.  These findings were reviewed with the patient in detail through an interpreter and based on her options she has elected to move forward with left shoulder arthroscopy with extensive debridement, distal clavicle excision, rotator cuff debridement with repair as indicated.  Questions encouraged and answered.  Risk benefits prognosis reviewed with the patient in detail. ? ?Follow-Up Instructions: No follow-ups on file.  ? ?Orders:  ?Orders Placed This Encounter  ?Procedures  ? XR Pelvis 1-2 Views  ? ?No orders of the defined types were placed in this encounter. ? ? ? ? Procedures: ?No procedures performed ? ? ?Clinical Data: ?No additional findings. ? ? ?Subjective: ?Chief Complaint  ?Patient presents with  ? Left Shoulder - Follow-up  ? ? ?HPI ? ?Colton returns today for follow-up of 2 issues.  First 1 is 1 year status post left total hip replacement.  She has done well and very happy with her  outcome.  The second issue is she is following up to discuss MRI left shoulder.   ? ?Review of Systems ? ? ?Objective: ?Vital Signs: There were no vitals taken for this visit. ? ?Physical Exam ? ?Ortho Exam ? ?Examination of the left hip shows a fully healed surgical scar.  Normal gait and ambulation.  Full range of motion without pain. ?Examination of the right shoulder shows pain with testing of the rotator cuff and AC joint.  Moderate pain with range of motion.  Manual muscle testing of rotator cuff shows slightly decreased strength secondary to pain. ? ?Specialty Comments:  ?No specialty comments available. ? ?Imaging: ?No results found. ? ? ?PMFS History: ?Patient Active Problem List  ? Diagnosis Date Noted  ? Nontraumatic incomplete tear of left rotator cuff 03/11/2022  ? Arthritis of left acromioclavicular joint 03/11/2022  ? Tendinopathy of left biceps 03/11/2022  ? Primary osteoarthritis of left knee 04/24/2021  ? Status post total replacement of left hip 03/11/2021  ? Primary osteoarthritis of left hip 10/05/2020  ? CVA (cerebral vascular accident) ([place]) 10/11/2019  ? CVA (cerebrovascular accident) ([place]) 10/11/2019  ? TIA (transient ischemic attack) 03/28/2019  ? Essential hypertension 03/28/2019  ? Type 2 diabetes mellitus treated without insulin ([place]) 03/28/2019  ? ?Past Medical History:  ?Diagnosis Date  ? Arthritis   ? hands  ? Diabetes  mellitus without complication ([place])   ? Hypertension   ? Stroke ([place]) 03/28/2019  ?  ?Family History  ?Problem Relation Age of Onset  ? Hypertension Mother   ? Hypertension Father   ?  ?Past Surgical History:  ?Procedure Laterality Date  ? ABDOMINAL HYSTERECTOMY    ? APPENDECTOMY    ? arthroplasty Right 2005  ? TOTAL HIP ARTHROPLASTY Left 03/11/2021  ? TOTAL HIP ARTHROPLASTY Left 03/11/2021  ? Procedure: LEFT TOTAL HIP ARTHROPLASTY ANTERIOR APPROACH;  Surgeon: Leandrew Koyanagi, MD;  Location: [place];  Service: Orthopedics;  Laterality: Left;  ? ?Social History   ? ?Occupational History  ? Not on file  ?Tobacco Use  ? Smoking status: Never  ? Smokeless tobacco: Never  ?Vaping Use  ? Vaping Use: Never used  ?Substance and Sexual Activity  ? Alcohol use: No  ? Drug use: No  ? Sexual activity: Yes  ?  Birth control/protection: Surgical  ? ? ? ? ? ? ?

## 2022-03-12 ENCOUNTER — Telehealth: Payer: Self-pay | Admitting: Orthopaedic Surgery

## 2022-03-12 NOTE — Telephone Encounter (Signed)
Tammy from [facility] called to say patient may not be optimized for surgery due to her A1c being too high.  Patient has not remained consistent with medications.  Tammy has spoken with Dr. Erlinda Hong and it has been decided to reschedule patient once her diabetes is controlled.   ?

## 2022-03-12 NOTE — Progress Notes (Signed)
Dr Erlinda Hong made aware that patient's A1c is 10.3. He states to call Debbie at his office to reschedule patient. ?

## 2022-03-17 ENCOUNTER — Other Ambulatory Visit: Payer: Self-pay

## 2022-03-19 ENCOUNTER — Ambulatory Visit (HOSPITAL_BASED_OUTPATIENT_CLINIC_OR_DEPARTMENT_OTHER): Admit: 2022-03-19 | Payer: MEDICAID | Admitting: Orthopaedic Surgery

## 2022-03-19 ENCOUNTER — Encounter (HOSPITAL_BASED_OUTPATIENT_CLINIC_OR_DEPARTMENT_OTHER): Payer: Self-pay

## 2022-03-19 SURGERY — SHOULDER ARTHROSCOPY WITH ROTATOR CUFF REPAIR AND SUBACROMIAL DECOMPRESSION
Anesthesia: General | Site: Shoulder | Laterality: Left

## 2022-03-26 ENCOUNTER — Encounter: Payer: Self-pay | Admitting: Physician Assistant

## 2022-03-28 ENCOUNTER — Encounter (HOSPITAL_COMMUNITY): Payer: Self-pay

## 2022-03-28 ENCOUNTER — Emergency Department (HOSPITAL_COMMUNITY)
Admission: EM | Admit: 2022-03-28 | Discharge: 2022-03-29 | Disposition: A | Discharge status: Home / Self Care | Payer: Self-pay | Attending: Emergency Medicine | Admitting: Emergency Medicine

## 2022-03-28 ENCOUNTER — Other Ambulatory Visit: Payer: Self-pay

## 2022-03-28 DIAGNOSIS — E119 Type 2 diabetes mellitus without complications: Secondary | ICD-10-CM | POA: Insufficient documentation

## 2022-03-28 DIAGNOSIS — J039 Acute tonsillitis, unspecified: Secondary | ICD-10-CM

## 2022-03-28 DIAGNOSIS — K1120 Sialoadenitis, unspecified: Secondary | ICD-10-CM

## 2022-03-28 DIAGNOSIS — I10 Essential (primary) hypertension: Secondary | ICD-10-CM | POA: Insufficient documentation

## 2022-03-28 MED ORDER — OXYCODONE-ACETAMINOPHEN 5-325 MG PO TABS
1.0000 | ORAL_TABLET | Freq: Once | ORAL | Status: AC
  Administered 2022-03-28: 1 via ORAL
  Filled 2022-03-28: qty 1

## 2022-03-28 NOTE — ED Provider Triage Note (Signed)
Emergency Medicine Provider Triage Evaluation Note ? ?[facility] [name] , a 66 y.o. female  was evaluated in triage.  Pt complains of left ear and facial pain x 2 days.  Patient states that she is now having more pain and swelling of the left side of her face and having difficulty eating or drinking due to pain. ?Hx of diabetes ? ?Review of Systems  ?Positive: As above ?Negative: Fever, dental problems ? ?Physical Exam  ?BP (!) 159/79   Pulse 75   Temp 97.6 ?F (36.4 ?C) (Oral)   Resp 18   SpO2 100%  ?Gen:   Awake, no distress   ?Resp:  Normal effort  ?MSK:   Moves extremities without difficulty  ?Other:   ? ?Medical Decision Making  ?Medically screening exam initiated at 9:45 PM.  Appropriate orders placed.  [facility] [name] was informed that the remainder of the evaluation will be completed by another provider, this initial triage assessment does not replace that evaluation, and the importance of remaining in the ED until their evaluation is complete. ? ? ?  ?Kateri Plummer, PA-C ?03/28/22 2145 ? ?

## 2022-03-28 NOTE — ED Triage Notes (Signed)
Pt arrives to ED POV c/o Left ear pain x2 days and pain is now radiating down her Left side of face and has not been able to eat or drink. ?

## 2022-03-29 ENCOUNTER — Emergency Department (HOSPITAL_COMMUNITY): Payer: Self-pay

## 2022-03-29 LAB — BASIC METABOLIC PANEL
Anion gap: 7 (ref 5–15)
BUN: 16 mg/dL (ref 8–23)
CO2: 25 mmol/L (ref 22–32)
Calcium: 9.4 mg/dL (ref 8.9–10.3)
Chloride: 105 mmol/L (ref 98–111)
Creatinine, Ser: 0.97 mg/dL (ref 0.44–1.00)
GFR, Estimated: >60 mL/min (ref >60)
Glucose, Bld: 147 mg/dL — ABNORMAL HIGH (ref 70–99)
Potassium: 4.1 mmol/L (ref 3.5–5.1)
Sodium: 137 mmol/L (ref 135–145)

## 2022-03-29 LAB — CBC WITH DIFFERENTIAL/PLATELET
Abs Immature Granulocytes: 0.03 10*3/uL (ref 0.00–0.07)
Basophils Absolute: 0 10*3/uL (ref 0.0–0.1)
Basophils Relative: 0 %
Eosinophils Absolute: 0 10*3/uL (ref 0.0–0.5)
Eosinophils Relative: 0 %
HCT: 37.4 % (ref 36.0–46.0)
Hemoglobin: 12.2 g/dL (ref 12.0–15.0)
Immature Granulocytes: 0 %
Lymphocytes Relative: 20 %
Lymphs Abs: 1.8 10*3/uL (ref 0.7–4.0)
MCH: 29.1 pg (ref 26.0–34.0)
MCHC: 32.6 g/dL (ref 30.0–36.0)
MCV: 89.3 fL (ref 80.0–100.0)
Monocytes Absolute: 0.8 10*3/uL (ref 0.1–1.0)
Monocytes Relative: 8 %
Neutro Abs: 6.7 10*3/uL (ref 1.7–7.7)
Neutrophils Relative %: 72 %
Platelets: 334 10*3/uL (ref 150–400)
RBC: 4.19 MIL/uL (ref 3.87–5.11)
RDW: 12.4 % (ref 11.5–15.5)
WBC: 9.4 10*3/uL (ref 4.0–10.5)
nRBC: 0 % (ref 0.0–0.2)

## 2022-03-29 LAB — GROUP A STREP BY PCR: Group A Strep by PCR: NOT DETECTED

## 2022-03-29 MED ORDER — AMOXICILLIN-POT CLAVULANATE 875-125 MG PO TABS: 1.0000 | ORAL_TABLET | Freq: Two times a day (BID) | ORAL | 0 refills | Status: AC

## 2022-03-29 MED ORDER — IOHEXOL 300 MG/ML  SOLN
100.0000 mL | Freq: Once | INTRAMUSCULAR | Status: AC | PRN
  Administered 2022-03-29: 100 mL via INTRAVENOUS

## 2022-03-29 MED ORDER — AMOXICILLIN-POT CLAVULANATE 875-125 MG PO TABS
1.0000 | ORAL_TABLET | Freq: Once | ORAL | Status: AC
Start: 2022-03-29 — End: 2022-03-29
  Administered 2022-03-29: 1 via ORAL
  Filled 2022-03-29: qty 1

## 2022-03-29 NOTE — ED Provider Notes (Signed)
? ?Emergency Department Provider Note ? ? ?I have reviewed the triage vital signs and the nursing notes. ? ? ?HISTORY ? ?Chief Complaint ?Otalgia ? ?Video Spanish interpreter used for encounter. ? ?HPI ?[facility] [name] is a 66 y.o. female with PMH of DM, HTN, and prior CVA presents to the ED with left ear/jaw pain. Symptoms developing over the last 2 days. Patient describes pain in the left ear/jaw making swallowing painful. Denies sore throat. No fever. No SOB. No ear drainage. No injury.  ? ? ?Past Medical History:  ?Diagnosis Date  ? Arthritis   ? hands  ? Diabetes mellitus without complication ([place])   ? Hypertension   ? Stroke [facility]) 03/28/2019  ? ? ?Review of Systems ? ?Constitutional: No fever/chills ?Eyes: No visual changes. ?ENT: No sore throat. Positive left ear/jaw pain. ?Cardiovascular: Denies chest pain. ?Respiratory: Denies shortness of breath. ?Gastrointestinal: No abdominal pain.  No nausea, no vomiting.  No diarrhea.  No constipation. ?Genitourinary: Negative for dysuria. ?Musculoskeletal: Negative for back pain. ?Skin: Negative for rash. ?Neurological: Negative for headaches, focal weakness or numbness. ? ?____________________________________________ ? ? ?PHYSICAL EXAM: ? ?VITAL SIGNS: ?ED Triage Vitals [03/28/22 2007]  ?Enc Vitals Group  ?   BP (!) 159/79  ?   Pulse Rate 75  ?   Resp 18  ?   Temp 97.6 ?F (36.4 ?C)  ?   Temp Source Oral  ?   SpO2 100 %  ? ? ?Constitutional: Alert and oriented. Well appearing and in no acute distress. ?Eyes: Conjunctivae are normal.  ?Head: Atraumatic. ?Ears:  Healthy appearing ear canals and TMs bilaterally. Mild mastoid tenderness. ?Nose: No congestion/rhinnorhea. ?Mouth/Throat: Mucous membranes are moist. No trismus. No PTA. Clear voice. Managing oral secretions.  ?Neck: No stridor.  ?Cardiovascular: Normal rate, regular rhythm. Good peripheral circulation. Grossly normal heart sounds.   ?Respiratory: Normal respiratory effort.  No retractions. Lungs  CTAB. ?Gastrointestinal: No distention.  ?Musculoskeletal: No lower extremity tenderness nor edema. No gross deformities of extremities. ?Neurologic: No gross neuro deficits.  ?Skin:  Skin is warm, dry and intact. No rash noted. ? ? ?____________________________________________ ?  ?LABS ?(all labs ordered are listed, but only abnormal results are displayed) ? ?Labs Reviewed  ?BASIC METABOLIC PANEL - Abnormal; Notable for the following components:  ?    Result Value  ? Glucose, Bld 147 (*)   ? All other components within normal limits  ?GROUP A STREP BY PCR  ?CBC WITH DIFFERENTIAL/PLATELET  ? ?____________________________________________ ? ?RADIOLOGY ? ?CT Soft Tissue Neck W Contrast ? ?Result Date: 03/29/2022 ?CLINICAL DATA:  66 year old female with otalgia. Pain radiating to the left face and neck. Painful swallowing. EXAM: CT NECK WITH CONTRAST TECHNIQUE: Multidetector CT imaging of the neck was performed using the standard protocol following the bolus administration of intravenous contrast. RADIATION DOSE REDUCTION: This exam was performed according to the departmental dose-optimization program which includes automated exposure control, adjustment of the mA and/or kV according to patient size and/or use of iterative reconstruction technique. CONTRAST:  124m OMNIPAQUE IOHEXOL 300 MG/ML  SOLN COMPARISON:  Face CT today reported separately. CTA neck 10/11/2019. FINDINGS: Pharynx and larynx: Larynx and epiglottis remain within normal limits. Generalized lingual, palatine tonsillar and adenoid hyperenhancement. Lingual greater than palatine tonsil enlargement. Postinflammatory palatine tonsil calcifications. Some trapped fluid suspected within the left tonsil (only 2-3 mm on series 3, image 28). No discrete tonsillar abscess. Subtle inflammation in the left parapharyngeal space. Right parapharyngeal and retropharyngeal spaces remain normal. Salivary glands:  Negative sublingual space. Partially atrophied submandibular  glands may be mildly hyperenhancing. There is mild asymmetric left submandibular space inflammation (series 6, image 52). No sialolithiasis or submandibular duct enlargement. Parotid glands appear symmetric and within normal limits. Thyroid: Negative. Lymph nodes: Subcentimeter but enhancing cervical lymph nodes such as right level 2 A series 3, image 34. No abnormally enlarged, cystic or necrotic nodes. Vascular: Major vascular structures in the neck and at the skull base remain patent including the left IJ. Carotid bifurcation atherosclerosis. Limited intracranial: Negative. Visualized orbits: Minimally included. Mastoids and visualized paranasal sinuses: Reported separately. Skeleton: Carious right posterior maxillary dentition. No acute osseous abnormality identified. Upper chest: Negative. IMPRESSION: 1. Acute Tonsillitis with generalized tonsillar and adenoid hyperenhancement. No discrete tonsillar abscess. 2. Mild inflammation in both the left parapharyngeal and left submandibular spaces. And possible acute infectious submandibular sialadenitis. 3. Reactive appearing cervical lymph nodes. 4. Carious right posterior maxillary dentition. 5. CT Face today reported separately. Electronically Signed   By: Genevie Ann M.D.   On: 03/29/2022 06:51  ? ?CT Maxillofacial W Contrast ? ?Result Date: 03/29/2022 ?CLINICAL DATA:  66 year old female with otalgia. Pain radiating to the left face and neck. Painful swallowing. 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 EXAM: CT MAXILLOFACIAL WITH CONTRAST TECHNIQUE: Multidetector CT imaging of the maxillofacial structures was performed with intravenous contrast. Multiplanar CT image reconstructions were also generated. RADIATION DOSE REDUCTION: This exam was performed according to the departmental dose-optimization program which includes automated exposure control, adjustment of the mA and/or kV according to patient size and/or use of iterative reconstruction  technique. CONTRAST:  181m OMNIPAQUE IOHEXOL 300 MG/ML  SOLN COMPARISON:  Neck CT today reported separately. Head CT 10/11/2019, brain MRI 03/28/2019. FINDINGS: Osseous: Carious posterior right maxillary molar and wisdom tooth. Mandible intact and normally located. Periapical wisdom tooth lucency, but not definitely communicating with the maxillary alveolar recess. Elsewhere the maxilla, zygoma, pterygoid, and nasal bones appear intact. Intact central skull base. Orbits: Intact orbital walls. Globes and intraorbital soft tissues appears symmetric and normal. Sinuses: Moderate bilateral maxillary sinus mucosal thickening with bubbly opacity greater on the left. Other paranasal sinuses are better aerated. There is only mild anterior ethmoid sinus mucosal thickening. Bilateral tympanic cavities and mastoids are clear. Soft tissues: Neck soft tissues including the tonsils are reported separately today. No superficial soft tissue abnormality identified. 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 On 2020 MRI the lesion was 49 x 23 by 28 mm when measured in a similar fashion. Similar lobulated and nodular configuration of the mass (series 3, image 76) extending into the left middle cranial fossa and sphenoid planum in addition to the suprasellar cistern and chronically abutting the tip of the basilar artery. Chronic involvement of the cavernous sinus. Regional mass effect does not appear significantly changed. No convincing cerebral edema. Chronic lacunar infarct left basal ganglia. Major vascular structures at the skull base are enhancing and appear to be patent. IMPRESSION: 1. Acute maxillary sinus inflammation. Middle ears and mastoids are clear. See Tonsillitis and other deep soft tissue face/neck findings on neck CT today reported separately. 2. 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 Chro

## 2022-04-09 ENCOUNTER — Ambulatory Visit (INDEPENDENT_AMBULATORY_CARE_PROVIDER_SITE_OTHER): Payer: Self-pay | Admitting: Primary Care

## 2022-04-09 ENCOUNTER — Encounter (INDEPENDENT_AMBULATORY_CARE_PROVIDER_SITE_OTHER): Payer: Self-pay | Admitting: Primary Care

## 2022-04-09 VITALS — BP 135/75 | HR 54 | Temp 98.1°F | Ht 63.0 in | Wt 148.4 lb

## 2022-04-09 DIAGNOSIS — J301 Allergic rhinitis due to pollen: Secondary | ICD-10-CM

## 2022-04-09 DIAGNOSIS — H5711 Ocular pain, right eye: Secondary | ICD-10-CM

## 2022-04-09 NOTE — Progress Notes (Signed)
?Assumption ? ?Cheryl Massey, is a 66 y.o. female ? ?QQI:[id number] ? ?HER:[id number] ? ?DOB - May 09, 1956 ? ?Chief Complaint  ?Patient presents with  ? redness of eye  ?  And pain. Per patient and her daughter patient had this same issue prior to have her stroke a few years ago  ?    ? ?Subjective:  ? ?Cheryl Massey is a 66 y.o. Hispanic female (interpreter Helene Kelp [phone]) ( daughter [name] is present and permission was given by patient.) here today for a follow up for T2D- too early for A1C and unable to have surgery until diabetes is better controlled. Patient felt that her eyes were red when she had her stroke. Educated her on s/s of a stroke. The pollen can cause watery , running eyes. States right eye near caruncle and eye lid has sore.(No other description given). No change in vision.  Patient has No headache, No chest pain, No abdominal pain - No Nausea, No new weakness tingling or numbness, No Cough - shortness of breath ? ?No problems updated. ? ?No Known Allergies ? ?Past Medical History:  ?Diagnosis Date  ? Arthritis   ? hands  ? Diabetes mellitus without complication ([place])   ? Hypertension   ? Stroke [facility]) 03/28/2019  ? ? ?Current Outpatient Medications on File Prior to Visit  ?Medication Sig Dispense Refill  ? aspirin EC 81 MG tablet Take 1 tablet (81 mg total) by mouth 2 (two) times daily. To be taken after surgery 84 tablet 0  ? atorvastatin (LIPITOR) 40 MG tablet Take 1 tablet (40 mg total) by mouth daily. 90 tablet 3  ? fluticasone (FLONASE) 50 MCG/ACT nasal spray Place 2 sprays into both nostrils daily. 16 g 6  ? glipiZIDE (GLUCOTROL) 10 MG tablet Take 1 tablet (10 mg total) by mouth 2 (two) times daily before a meal. 180 tablet 1  ? levocetirizine (XYZAL) 5 MG tablet Take 1 tablet (5 mg total) by mouth every evening. 90 tablet 0  ? lisinopril (ZESTRIL) 20 MG tablet Take 1 tablet (20 mg total) by mouth daily. 90 tablet 1  ? metFORMIN (GLUCOPHAGE) 1000 MG tablet Take 1  tablet (1,000 mg total) by mouth 2 (two) times daily with a meal. 180 tablet 3  ? sitaGLIPtin (JANUVIA) 100 MG tablet TAKE 1 TABLET (100 MG TOTAL) BY MOUTH DAILY. 90 tablet 1  ? ?No current facility-administered medications on file prior to visit.  ? ?Comprehensive ROS Pertinent positive and negative noted in HPI   ?Objective:  ? ?Vitals:  ? 04/09/22 1338  ?BP: 135/75  ?Pulse: (!) 54  ?Temp: 98.1 ?F (36.7 ?C)  ?TempSrc: Oral  ?SpO2: 98%  ?Weight: 148 lb 6.4 oz (67.3 kg)  ?Height: '5\' 3"'$  (1.6 m)  ? ? ?Exam ?General appearance : Awake, alert, not in any distress. Speech Clear. Not toxic looking ?HEENT: Atraumatic and Normocephalic, pupils equally reactive to light and accomodation ?Neck: Supple, no JVD. No cervical lymphadenopathy.  ?Chest: Good air entry bilaterally, no added sounds  ?CVS: S1 S2 regular, no murmurs.  ?Abdomen: Bowel sounds present, Non tender and not distended with no gaurding, rigidity or rebound. ?Extremities: B/L Lower Ext shows no edema, both legs are warm to touch. Left arm limited rom ?Skin: No Rash ? ?Data Review ?Lab Results  ?Component Value Date  ? HGBA1C 10.3 (A) 02/05/2022  ? HGBA1C 8.4 (A) 10/24/2021  ? HGBA1C 6.5 (A) 04/23/2021  ? ? ?Assessment & Plan  ? ?1. Non-seasonal allergic rhinitis  due to pollen ?Take xyzal that was picked up from the pharmacy daily  ? ?2. Eye pain, right ?Last eye exam 2 years ago exam. diabetic eye exam are supposed to be done yearly. ?Refer to opthalmologic - information will be provided for ophthalmologist to call for appt.  ? ?Patient have been counseled extensively about nutrition and exercise. Other issues discussed during this visit include: low cholesterol diet, weight control and daily exercise, foot care, annual eye examinations at Ophthalmology, importance of adherence with medications and regular follow-up. We also discussed long term complications of uncontrolled diabetes and hypertension.  ? ?Return for keep scheduled appt.. ? ?The patient was given  clear instructions to go to ER or return to medical center if symptoms don't improve, worsen or new problems develop. The patient verbalized understanding. The patient was told to call to get lab results if they haven't heard anything in the next week.  ? ?This note has been created with Surveyor, quantity. Any transcriptional errors are unintentional.  ? ?Kerin Perna, NP ?04/09/2022, 2:05 PM ? ?

## 2022-04-14 ENCOUNTER — Other Ambulatory Visit: Payer: Self-pay

## 2022-04-18 ENCOUNTER — Other Ambulatory Visit: Payer: Self-pay

## 2022-05-05 ENCOUNTER — Other Ambulatory Visit: Payer: Self-pay

## 2022-05-05 ENCOUNTER — Encounter (INDEPENDENT_AMBULATORY_CARE_PROVIDER_SITE_OTHER): Payer: Self-pay | Admitting: Primary Care

## 2022-05-05 ENCOUNTER — Ambulatory Visit (INDEPENDENT_AMBULATORY_CARE_PROVIDER_SITE_OTHER): Payer: Self-pay | Admitting: Primary Care

## 2022-05-05 VITALS — BP 137/80 | HR 61 | Temp 97.7°F | Ht 61.0 in | Wt 145.0 lb

## 2022-05-05 DIAGNOSIS — Z760 Encounter for issue of repeat prescription: Secondary | ICD-10-CM

## 2022-05-05 DIAGNOSIS — Z780 Asymptomatic menopausal state: Secondary | ICD-10-CM

## 2022-05-05 DIAGNOSIS — E782 Mixed hyperlipidemia: Secondary | ICD-10-CM

## 2022-05-05 DIAGNOSIS — E119 Type 2 diabetes mellitus without complications: Secondary | ICD-10-CM

## 2022-05-05 DIAGNOSIS — I10 Essential (primary) hypertension: Secondary | ICD-10-CM

## 2022-05-05 LAB — POCT GLYCOSYLATED HEMOGLOBIN (HGB A1C): Hemoglobin A1C: 8.2 % — AB (ref 4.0–5.6)

## 2022-05-05 MED ORDER — METFORMIN HCL 1000 MG PO TABS
1000.0000 mg | ORAL_TABLET | Freq: Two times a day (BID) | ORAL | 3 refills | Status: DC
  Filled 2022-05-05 – 2022-05-13 (×2): qty 180, 90d supply, fill #0

## 2022-05-05 MED ORDER — GLIPIZIDE 10 MG PO TABS
10.0000 mg | ORAL_TABLET | Freq: Two times a day (BID) | ORAL | 1 refills | Status: DC
  Filled 2022-05-05: qty 180, 90d supply, fill #0
  Filled 2022-05-13: qty 60, 30d supply, fill #0

## 2022-05-05 MED ORDER — SITAGLIPTIN PHOSPHATE 100 MG PO TABS
ORAL_TABLET | Freq: Every day | ORAL | 1 refills | Status: DC
  Filled 2022-05-05: qty 90, fill #0
  Filled 2022-05-05: qty 90, 90d supply, fill #0

## 2022-05-05 NOTE — Progress Notes (Signed)
Subjective:  Patient ID: Cheryl Massey Cheryl Massey, female    DOB: Dec 20, 1955  Age: 66 y.o. MRN: [id number]  CC: Diabetes   HPI [name] [place] Cheryl Massey 66 year old Hispanic female ( interpreter Jesus [phone])  presents for Follow-up of diabetes. Patient does check blood sugar at home  Compliant with meds - Yes Checking CBGs? Yes  Fasting avg - 99-147  Postprandial average -  Exercising regularly? - Yes Watching carbohydrate intake? - Yes Neuropathy ? - No Hypoglycemic events - No  - Recovers with :   Pertinent ROS:  Polyuria - No Polydipsia - No Vision problems - No Management of HTN- Denies shortness of breath, headaches, chest pain or lower extremity edema  Medications as noted below. Taking them regularly without complication/adverse reaction being reported today.   History Cheryl Massey has a past medical history of Arthritis, Diabetes mellitus without complication ([place]), Hypertension, and Stroke ([place]) (03/28/2019).   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   Her family history includes Hypertension in her father and mother.She reports that she has never smoked. She has never used smokeless tobacco. She reports that she does not drink alcohol and does not use drugs.  Current Outpatient Medications on File Prior to Visit  Medication Sig Dispense Refill   aspirin EC 81 MG tablet Take 1 tablet (81 mg total) by mouth 2 (two) times daily. To be taken after surgery 84 tablet 0   fluticasone (FLONASE) 50 MCG/ACT nasal spray Place 2 sprays into both nostrils daily. 16 g 6   levocetirizine (XYZAL) 5 MG tablet Take 1 tablet (5 mg total) by mouth every evening. 90 tablet 0   lisinopril (ZESTRIL) 20 MG tablet Take 1 tablet (20 mg total) by mouth daily. 90 tablet 1   atorvastatin (LIPITOR) 40 MG tablet Take 1 tablet (40  mg total) by mouth daily. (Patient not taking: Reported on 05/05/2022) 90 tablet 3   No current facility-administered medications on file prior to visit.    ROS Comprehensive ROS Pertinent positive and negative noted in HPI    Objective:  BP 137/80   Pulse 61   Temp 97.7 F (36.5 C) (Oral)   Ht '5\' 1"'$  (1.549 m)   Wt 145 lb (65.8 kg)   SpO2 100%   BMI 27.40 kg/m   BP Readings from Last 3 Encounters:  05/05/22 137/80  04/09/22 135/75  03/29/22 138/76    Wt Readings from Last 3 Encounters:  05/05/22 145 lb (65.8 kg)  04/09/22 148 lb 6.4 oz (67.3 kg)  02/05/22 149 lb 9.6 oz (67.9 kg)    Physical Exam Vitals reviewed.  Constitutional:      Appearance: Normal appearance.  HENT:     Head: Normocephalic.     Right Ear: Tympanic membrane normal.     Left Ear: Tympanic membrane normal.     Nose: Nose normal.  Eyes:     Extraocular Movements: Extraocular movements intact.     Conjunctiva/sclera: Conjunctivae normal.     Pupils: Pupils are equal, round, and reactive to light.  Cardiovascular:     Rate and Rhythm: Normal rate and regular rhythm.  Pulmonary:     Effort: Pulmonary effort is normal.     Breath sounds: Normal breath sounds.  Abdominal:     General: Abdomen is flat. Bowel sounds are normal.     Palpations: Abdomen is soft.  Musculoskeletal:        General: Swelling and tenderness present.     Cervical back: Normal range of motion and neck supple.     Comments: Unable to raise left arm   Skin:    General: Skin is warm and dry.  Neurological:     Mental Status: She is alert and oriented to person, place, and time.  Psychiatric:        Mood and Affect: Mood normal.        Behavior: Behavior normal.        Thought Content: Thought content normal.   Lab Results  Component Value Date   HGBA1C 8.2 (A) 05/05/2022   HGBA1C 10.3 (A) 02/05/2022   HGBA1C 8.4 (A) 10/24/2021    Lab Results  Component Value Date   WBC 9.4 03/29/2022   HGB 12.2 03/29/2022    HCT 37.4 03/29/2022   PLT 334 03/29/2022   GLUCOSE 108 (H) 05/05/2022   CHOL 191 05/05/2022   TRIG 265 (H) 05/05/2022   HDL 49 05/05/2022   LDLDIRECT 92.9 03/29/2019   LDLCALC 97 05/05/2022   ALT 11 05/05/2022   AST 15 05/05/2022   NA 141 05/05/2022   K 5.4 (H) 05/05/2022   CL 105 05/05/2022   CREATININE 1.05 (H) 05/05/2022   BUN 19 05/05/2022   CO2 22 05/05/2022   TSH 1.136 03/29/2019   INR 1.0 03/07/2021   HGBA1C 8.2 (A) 05/05/2022     Assessment & Plan:  Dawnielle was seen today for diabetes.  Diagnoses and all orders for this visit:  Type 2 diabetes mellitus treated without insulin (HCC) -     HgB A1c 8.2 previously 3 months ago 10.3 improving nicely.  Fasting blood sugars have been less than 150.  Remains on triple therapy metformin, at 1000 mg twice daily, Glucotrol 10 mg twice daily and Januvia 100 mg daily.  She continues to monitor f this oods that are high in carbohydrates are the following rice, potatoes, tortillas, breads, sugars, and pastas.  Reduction in the intake (eating) will assist in lowering your blood sugars.   Post-menopause Will check vitamin D levels   Essential hypertension We have discussed target BP range and blood pressure goal. I have advised patient to check BP regularly and to call us back or report to clinic if the numbers are consistently higher than 140/90. We discussed the importance of compliance with medical therapy and DASH diet recommended, consequences of uncontrolled hypertension discussed.  - continue current BP medications   Mixed hyperlipidemia She has been noncompliant with taking her cholesterol medications.  Simply simply put explained to her arteries may reference cholesterol is held the blood flows due to body.  Instead we use smoothies, endosmoothies we added fruit.  Asked her was she able to get her smoothie through the straw her answer was no explained the issue can you just have to set up through the straw harder.  That is the  same way your heart has to pump harder to push through blood through plaque buildup in the arteries.  Her eyes open-repeat panel evaluate  Medication refill -     glipiZIDE (GLUCOTROL) 10 MG tablet; Take 1 tablet (10 mg total) by mouth 2 (two) times daily before a meal. -     metFORMIN (GLUCOPHAGE) 1000 MG tablet; Take 1 tablet (1,000 mg total) by mouth 2 (two) times daily with a meal. -     sitaGLIPtin (JANUVIA) 100 MG tablet; TAKE 1 TABLET (100 MG  TOTAL) BY MOUTH DAILY.      I am having Camillo Flaming. Cheryl Massey maintain her aspirin EC, atorvastatin, lisinopril, fluticasone, levocetirizine, glipiZIDE, metFORMIN, and sitaGLIPtin.  Meds ordered this encounter  Medications   glipiZIDE (GLUCOTROL) 10 MG tablet    Sig: Take 1 tablet (10 mg total) by mouth 2 (two) times daily before a meal.    Dispense:  180 tablet    Refill:  1   metFORMIN (GLUCOPHAGE) 1000 MG tablet    Sig: Take 1 tablet (1,000 mg total) by mouth 2 (two) times daily with a meal.    Dispense:  180 tablet    Refill:  3   sitaGLIPtin (JANUVIA) 100 MG tablet    Sig: TAKE 1 TABLET (100 MG TOTAL) BY MOUTH DAILY.    Dispense:  90 tablet    Refill:  1     Follow-up:   Return in about 3 months (around 08/05/2022) for DM.  The above assessment and management plan was discussed with the patient. The patient verbalized understanding of and has agreed to the management plan. Patient is aware to call the clinic if symptoms fail to improve or worsen. Patient is aware when to return to the clinic for a follow-up visit. Patient educated on when it is appropriate to go to the emergency department.   Juluis Mire, NP-C

## 2022-05-06 ENCOUNTER — Other Ambulatory Visit: Payer: Self-pay

## 2022-05-06 LAB — LIPID PANEL
Chol/HDL Ratio: 3.9 ratio (ref 0.0–4.4)
Cholesterol, Total: 191 mg/dL (ref 100–199)
HDL: 49 mg/dL (ref >39)
LDL Chol Calc (NIH): 97 mg/dL (ref 0–99)
Triglycerides: 265 mg/dL — ABNORMAL HIGH (ref 0–149)
VLDL Cholesterol Cal: 45 mg/dL — ABNORMAL HIGH (ref 5–40)

## 2022-05-06 LAB — COMPREHENSIVE METABOLIC PANEL
ALT: 11 IU/L (ref 0–32)
AST: 15 IU/L (ref 0–40)
Albumin/Globulin Ratio: 1.7 (ref 1.2–2.2)
Albumin: 4.6 g/dL (ref 3.8–4.8)
Alkaline Phosphatase: 66 IU/L (ref 44–121)
BUN/Creatinine Ratio: 18 (ref 12–28)
BUN: 19 mg/dL (ref 8–27)
Bilirubin Total: 0.2 mg/dL (ref 0.0–1.2)
CO2: 22 mmol/L (ref 20–29)
Calcium: 9.9 mg/dL (ref 8.7–10.3)
Chloride: 105 mmol/L (ref 96–106)
Creatinine, Ser: 1.05 mg/dL — ABNORMAL HIGH (ref 0.57–1.00)
Globulin, Total: 2.7 g/dL (ref 1.5–4.5)
Glucose: 108 mg/dL — ABNORMAL HIGH (ref 70–99)
Potassium: 5.4 mmol/L — ABNORMAL HIGH (ref 3.5–5.2)
Sodium: 141 mmol/L (ref 134–144)
Total Protein: 7.3 g/dL (ref 6.0–8.5)
eGFR: 59 mL/min/{1.73_m2} — ABNORMAL LOW (ref >59)

## 2022-05-06 LAB — VITAMIN D 25 HYDROXY (VIT D DEFICIENCY, FRACTURES): Vit D, 25-Hydroxy: 12.7 ng/mL — ABNORMAL LOW (ref 30.0–100.0)

## 2022-05-13 ENCOUNTER — Other Ambulatory Visit: Payer: Self-pay

## 2022-05-15 ENCOUNTER — Other Ambulatory Visit: Payer: Self-pay

## 2022-05-15 ENCOUNTER — Other Ambulatory Visit (INDEPENDENT_AMBULATORY_CARE_PROVIDER_SITE_OTHER): Payer: Self-pay | Admitting: Primary Care

## 2022-05-15 DIAGNOSIS — E781 Pure hyperglyceridemia: Secondary | ICD-10-CM

## 2022-05-15 DIAGNOSIS — E559 Vitamin D deficiency, unspecified: Secondary | ICD-10-CM

## 2022-05-15 MED ORDER — ERGOCALCIFEROL 1.25 MG (50000 UT) PO CAPS
50000.0000 [IU] | ORAL_CAPSULE | ORAL | 0 refills | Status: DC
  Filled 2022-05-15: qty 12, 84d supply, fill #0

## 2022-05-15 MED ORDER — GEMFIBROZIL 600 MG PO TABS
600.0000 mg | ORAL_TABLET | Freq: Two times a day (BID) | ORAL | 1 refills | Status: DC
  Filled 2022-05-15: qty 60, 30d supply, fill #0

## 2022-05-16 ENCOUNTER — Telehealth (INDEPENDENT_AMBULATORY_CARE_PROVIDER_SITE_OTHER): Payer: Self-pay

## 2022-05-16 NOTE — Telephone Encounter (Signed)
-----   Message from Kerin Perna, NP sent at 05/15/2022  3:17 PM EDT ----- Your cholesterol is high, Increase risk of heart attack and/or stroke.  To reduce your Cholesterol , Remember - more fruits and vegetables, more fish, and limit red meat and dairy products.New script sent for gemfibrozil '600mg'$  twice a day. Your Vitamin D is low this is needed to make and keep bones strong. You will need to take prescription strength vitamin D tablet once weekly until next appointment. I have sent the vitamin D tablet to the pharmacy.

## 2022-05-16 NOTE — Telephone Encounter (Signed)
Call placed to patient with assistance of pacific interpreter KORJ([id number]) per DPR all results given to patient daughter. She is aware of medications sent for elevated cholesterol and low vitamin D. She verbalized understanding. Nat Christen, CMA

## 2022-05-19 ENCOUNTER — Other Ambulatory Visit (INDEPENDENT_AMBULATORY_CARE_PROVIDER_SITE_OTHER): Payer: Self-pay | Admitting: Primary Care

## 2022-05-19 ENCOUNTER — Other Ambulatory Visit: Payer: Self-pay

## 2022-05-19 DIAGNOSIS — Z760 Encounter for issue of repeat prescription: Secondary | ICD-10-CM

## 2022-05-19 DIAGNOSIS — E119 Type 2 diabetes mellitus without complications: Secondary | ICD-10-CM

## 2022-08-05 ENCOUNTER — Other Ambulatory Visit: Payer: Self-pay

## 2022-08-05 ENCOUNTER — Ambulatory Visit (INDEPENDENT_AMBULATORY_CARE_PROVIDER_SITE_OTHER): Payer: Self-pay | Admitting: Primary Care

## 2022-08-05 ENCOUNTER — Encounter (INDEPENDENT_AMBULATORY_CARE_PROVIDER_SITE_OTHER): Payer: Self-pay | Admitting: Primary Care

## 2022-08-05 VITALS — BP 149/80 | HR 50 | Temp 97.9°F | Ht 61.0 in | Wt 144.0 lb

## 2022-08-05 DIAGNOSIS — E119 Type 2 diabetes mellitus without complications: Secondary | ICD-10-CM

## 2022-08-05 DIAGNOSIS — Z23 Encounter for immunization: Secondary | ICD-10-CM

## 2022-08-05 DIAGNOSIS — Z760 Encounter for issue of repeat prescription: Secondary | ICD-10-CM

## 2022-08-05 LAB — POCT GLYCOSYLATED HEMOGLOBIN (HGB A1C): Hemoglobin A1C: 9.2 % — AB (ref 4.0–5.6)

## 2022-08-05 MED ORDER — SITAGLIPTIN PHOSPHATE 100 MG PO TABS
ORAL_TABLET | Freq: Every day | ORAL | 1 refills | Status: DC
Start: 2022-08-05 — End: 2022-08-20
  Filled 2022-08-05: qty 90, fill #0

## 2022-08-05 MED ORDER — GLIPIZIDE 10 MG PO TABS
10.0000 mg | ORAL_TABLET | Freq: Two times a day (BID) | ORAL | 1 refills | Status: DC
  Filled 2022-08-05: qty 180, 90d supply, fill #0

## 2022-08-05 MED ORDER — SITAGLIPTIN PHOSPHATE 100 MG PO TABS
ORAL_TABLET | Freq: Every day | ORAL | 1 refills | Status: DC
  Filled 2022-08-05: qty 90, fill #0

## 2022-08-05 NOTE — Progress Notes (Signed)
Subjective:  Patient ID: Elmyra Ricks [name], female    DOB: 1956-06-10  Age: 66 y.o. MRN: [id number]  CC: Diabetes   HPI Ms. [facility] [name] is a 66 year old Hispanic female (interpreter Florentina Jenny [phone] [name] given permission to be present at appt. presents for follow-up of diabetes. Patient does check blood sugar at home.  Compliant with meds -No reviewing medication refills and picked up -indicated not taking consistently.Management of HTN-Patient has No headache, No chest pain, No abdominal pain - No Nausea, No new weakness tingling or numbness, No Cough - shortness of breath  Checking CBGs? Yes  Fasting avg - 100-140  Postprandial average -  Exercising regularly? - Yes Watching carbohydrate intake? - Yes Neuropathy ? - No Hypoglycemic events - No  - Recovers with :   Pertinent ROS:  Polyuria - No Polydipsia - No Vision problems - No Management of HTN- Patient has No headache, No chest pain, No abdominal pain - No Nausea, No new weakness tingling or numbness, No Cough - shortness of breath  Medications as noted below. Taking them regularly without complication/adverse reaction being reported today.   History Meklit has a past medical history of Arthritis, Diabetes mellitus without complication ([place]), Hypertension, and Stroke ([place]) (03/28/2019).   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   Her family history includes Hypertension in her father and mother.She reports that she has never smoked. She has never used smokeless tobacco. She reports that she does not drink alcohol and does not use drugs.  Current Outpatient Medications on File Prior to Visit  Medication Sig Dispense Refill   lisinopril (ZESTRIL) 20 MG tablet Take 1 tablet (20 mg total) by mouth daily. 90 tablet 1   aspirin EC 81  MG tablet Take 1 tablet (81 mg total) by mouth 2 (two) times daily. To be taken after surgery 84 tablet 0   fluticasone (FLONASE) 50 MCG/ACT nasal spray Place 2 sprays into both nostrils daily. 16 g 6   gemfibrozil (LOPID) 600 MG tablet Take 1 tablet (600 mg total) by mouth 2 (two) times daily before a meal. 180 tablet 1   glipiZIDE (GLUCOTROL) 10 MG tablet Take 1 tablet (10 mg total) by mouth 2 (two) times daily before a meal. 180 tablet 1   metFORMIN (GLUCOPHAGE) 1000 MG tablet Take 1 tablet (1,000 mg total) by mouth 2 (two) times daily with a meal. 180 tablet 3   No current facility-administered medications on file prior to visit.    ROS Comprehensive ROS Pertinent positive and negative noted in HPI    Objective:  BP (!) 149/80   Pulse (!) 50   Temp 97.9 F (36.6 C) (Oral)   Ht '5\' 1"'$  (1.549 m)   Wt 144 lb (65.3 kg)   SpO2 100%   BMI 27.21 kg/m   BP Readings from Last 3 Encounters:  08/05/22 (!) 149/80  05/05/22 137/80  04/09/22 135/75    Wt Readings from Last 3 Encounters:  08/05/22 144 lb (65.3 kg)  05/05/22 145 lb (65.8 kg)  04/09/22 148 lb 6.4 oz (67.3 kg)    Physical Exam Vitals reviewed.  Constitutional:      Appearance: Normal appearance.  HENT:     Head: Normocephalic.     Right Ear: External ear normal.     Left Ear: External ear normal.     Nose:  Nose normal.  Eyes:     Extraocular Movements: Extraocular movements intact.  Cardiovascular:     Rate and Rhythm: Normal rate and regular rhythm.  Pulmonary:     Effort: Pulmonary effort is normal.     Breath sounds: Normal breath sounds.  Abdominal:     General: Bowel sounds are normal. There is distension.  Musculoskeletal:        General: Normal range of motion.     Cervical back: Normal range of motion.  Skin:    General: Skin is warm and dry.  Neurological:     Mental Status: She is alert and oriented to person, place, and time.  Psychiatric:        Mood and Affect: Mood normal.        Behavior:  Behavior normal.    Lab Results  Component Value Date   HGBA1C 9.2 (A) 08/05/2022   HGBA1C 8.2 (A) 05/05/2022   HGBA1C 10.3 (A) 02/05/2022    Lab Results  Component Value Date   WBC 9.4 03/29/2022   HGB 12.2 03/29/2022   HCT 37.4 03/29/2022   PLT 334 03/29/2022   GLUCOSE 108 (H) 05/05/2022   CHOL 191 05/05/2022   TRIG 265 (H) 05/05/2022   HDL 49 05/05/2022   LDLDIRECT 92.9 03/29/2019   LDLCALC 97 05/05/2022   ALT 11 05/05/2022   AST 15 05/05/2022   NA 141 05/05/2022   K 5.4 (H) 05/05/2022   CL 105 05/05/2022   CREATININE 1.05 (H) 05/05/2022   BUN 19 05/05/2022   CO2 22 05/05/2022   TSH 1.136 03/29/2019   INR 1.0 03/07/2021   HGBA1C 9.2 (A) 08/05/2022     Assessment & Plan:   Bailee was seen today for diabetes.  Diagnoses and all orders for this visit:  Type 2 diabetes mellitus treated without insulin (HCC) -     HgB A1c 9.2-  Daughter picks up her medication and blamed herself for A1C being elevated she never picked up sitaGLIPtin (JANUVIA) 100 MG tablet and only taking and taking glipizide '10mg'$  daily. Reviewed all medication name and how to take them then asked interpreter to ask daughter to repeat medication regiment - she was able to Discussed  co- morbidities with uncontrol diabetes  Complications -diabetic retinopathy, (close your eyes ? What do you see nothing) nephropathy decrease in kidney function- can lead to dialysis-on a machine 3 days a week to filter your kidney, neuropathy- numbness and tinging in your hands and feet,  increase risk of heart attack and stroke, and amputation due to decrease wound healing and circulation. Decrease your risk by taking medication daily as prescribed, monitor carbohydrates- foods that are high in carbohydrates are the following rice, potatoes, breads, sugars, and pastas.  Reduction in the intake (eating) will assist in lowering your blood sugars. Exercise daily at least 30 minutes daily.   Need for immunization against  influenza -     Flu Vaccine QUAD 23moIM (Fluarix, Fluzone & Alfiuria Quad PF)   I have discontinued MCamillo Flaming LEugenio HoesParra's levocetirizine and ergocalciferol. I am also having her maintain her aspirin EC, lisinopril, fluticasone, glipiZIDE, metFORMIN, gemfibrozil, and sitaGLIPtin.  Meds ordered this encounter  Medications   sitaGLIPtin (JANUVIA) 100 MG tablet    Sig: TAKE 1 TABLET (100 MG TOTAL) BY MOUTH DAILY.    Dispense:  90 tablet    Refill:  1    Order Specific Question:   Supervising Provider    Answer:   JTresa Garter[  [id number]]     Follow-up:   Return in about 3 months (around 11/05/2022) for DM/fasting.  The above assessment and management plan was discussed with the patient. The patient verbalized understanding of and has agreed to the management plan. Patient is aware to call the clinic if symptoms fail to improve or worsen. Patient is aware when to return to the clinic for a follow-up visit. Patient educated on when it is appropriate to go to the emergency department.   Juluis Mire, NP-C

## 2022-08-05 NOTE — Patient Instructions (Signed)
Gripe en los adultos Influenza, Adult A la gripe tambin se la conoce como "influenza". Es una [employer], la nariz y la garganta (vas respiratorias). Se transmite fcilmente de persona a persona (es contagiosa). La gripe causa sntomas que son [employer] de un resfro, junto con fiebre alta y dolores corporales. Cules son las causas? La causa de esta afeccin es el virus de la influenza. Puede contraer el virus de las siguientes maneras: Al inhalar gotitas que quedan en el aire despus de que una persona infectada con gripe tosi o estornud. Al tocar algo que est contaminado con el virus y [employer] mano a la boca, la nariz o los ojos. Qu incrementa el riesgo? Hay ciertas cosas que lo pueden hacer ms propenso a Nurse, adult. Estas incluyen lo siguiente: No lavarse las manos con frecuencia. Tener contacto cercano con [employer] durante la temporada de resfro y gripe. Tocarse la boca, los ojos o la nariz sin antes lavarse las manos. No recibir la [employer]. Puede correr un mayor riesgo de [place] gripe, y [place] graves, como una infeccin pulmonar (neumona), si usted: Es mayor de 39 aos de edad. Est embarazada. Tiene debilitado el sistema que combate las defensas (sistema inmunitario) debido a una enfermedad o a que toma determinados medicamentos. Tiene una afeccin a largo plazo (crnica), como las siguientes: Enfermedad cardaca, renal o pulmonar. Diabetes. Asma. Tiene un trastorno heptico. Tiene mucho sobrepeso (obesidad [place]). Tiene anemia. Cules son los signos o sntomas? Los sntomas normalmente comienzan de repente y [place] [address] y [address] Pueden incluir los siguientes: Cristy Hilts y [place]. Dolores de [place], dolores en el cuerpo o dolores musculares. Dolor de Investment banker, operational. Tos. Secrecin o congestin nasal. Molestias en el pecho. No querer comer tanto como lo hace normalmente. Sensacin de debilidad o  cansancio. Mareos. Malestar estomacal o vmitos. Cmo se trata? Si la gripe se detecta de forma temprana, puede recibir tratamiento con medicamentos antivirales. Esto puede ayudar a reducir la gravedad y la duracin de la enfermedad. Se los administrarn por boca o a travs de un tubo (catter) intravenoso. Cuidarse en su hogar puede ayudar a que mejoren los sntomas. El mdico puede recomendarle lo siguiente: Tomar medicamentos de Radio broadcast assistant. Beber abundante lquido. La gripe suele desaparecer sola. Si tiene sntomas muy graves u otros problemas, puede recibir tratamiento en un hospital. Siga estas instrucciones en su casa:     Actividad Descanse todo lo que sea necesario. Duerma lo suficiente. Foy [place] en su casa y no concurra al [place] o a la escuela, como se lo haya indicado el mdico. No salga de su casa hasta que no haya tenido fiebre por 24 horas sin tomar medicamentos. Salga de su casa solamente para ir al [employer]. Comida y bebida Wyatt Haste SRO (solucin de rehidratacin oral). Es Ardelia Mems bebida que se vende en farmacias y tiendas. Beba suficiente lquido como para Theatre manager la orina de color amarillo plido. En la medida en que pueda, beba lquidos transparentes en pequeas cantidades. Los lquidos transparentes son, por ejemplo: Grayce Sessions. Trocitos de hielo. Jugo de frutas mezclado con agua. Bebidas deportivas de bajas caloras. Coma alimentos suaves que sean fciles de digerir. En la medida que pueda, consuma pequeas cantidades. Estos alimentos incluyen: Bananas. Pur de [employer]. Arroz. Carnes magras. Tostadas. Galletas. No coma ni beba lo siguiente: Lquidos con alto contenido de azcar o cafena. Alcohol. Alimentos condimentados o con alto contenido de [place]. Indicaciones generales Use los medicamentos de venta libre y los  recetados solamente como se lo haya indicado el mdico. Use un humidificador de aire fro para que el aire de su casa est ms hmedo. Esto puede  facilitar la respiracin. Cuando utilice un humidificador de vapor fro, lmpielo a diario. Vace el agua y [place] por agua limpia. Al toser o estornudar, cbrase la boca y la [place]. Lvese las manos frecuentemente con agua y [place] y durante al menos 20 segundos. Esto tambin es importante despus de toser o Brewing technologist. Si no dispone de [place] y [place], use desinfectante para manos con alcohol. Cumpla con todas las visitas de seguimiento. Cmo se previene?  Colquese la vacuna antigripal todos los [place]. Puede colocarse la vacuna contra la gripe a fines de verano, en otoo o en invierno. Pregntele al mdico cundo debe aplicarse la vacuna contra la gripe. Evite el contacto con personas que estn enfermas durante el otoo y el invierno. Es la temporada del resfro y Counsellor. Comunquese con un mdico si: Tiene sntomas nuevos. Tiene los siguientes sntomas: Dolor de [place]. Materia fecal lquida (diarrea). Cristy Hilts. La tos empeora. Empieza a tener ms mucosidad. Tiene Higher education careers adviser. Vomita. Solicite ayuda de inmediato si: Le falta el aire. Tiene dificultad para respirar. La piel o las uas se ponen de un color azulado. Presenta dolor muy intenso o rigidez en el cuello. Tiene dolor de cabeza repentino. Le duele la cara o el odo de forma repentina. No puede comer ni beber sin vomitar. Estos sntomas pueden representar un problema grave que constituye Engineer, maintenance (IT). Solicite atencin mdica de inmediato. Comunquese con el servicio de emergencias de su localidad (911 en los [place]). No espere a ver si los sntomas desaparecen. No conduzca por sus propios medios [employer]. Resumen A la gripe tambin se la conoce como "influenza". Es una [employer], la nariz y Patent examiner. Se transmite fcilmente de [place] persona a otra. Use los medicamentos de venta libre y los recetados solamente como se lo haya indicado el mdico. Aplicarse la vacuna contra la gripe  todos los aos es la mejor manera de no contagiarse la gripe. Esta informacin no tiene Marine scientist el consejo del mdico. Asegrese de hacerle al mdico cualquier pregunta que tenga. Document Revised: 09/27/2020 Document Reviewed: 09/27/2020 Elsevier Patient Education  [place].

## 2022-08-20 ENCOUNTER — Other Ambulatory Visit (INDEPENDENT_AMBULATORY_CARE_PROVIDER_SITE_OTHER): Payer: Self-pay | Admitting: Primary Care

## 2022-08-20 ENCOUNTER — Other Ambulatory Visit: Payer: Self-pay

## 2022-08-20 DIAGNOSIS — E119 Type 2 diabetes mellitus without complications: Secondary | ICD-10-CM

## 2022-08-20 DIAGNOSIS — Z760 Encounter for issue of repeat prescription: Secondary | ICD-10-CM

## 2022-08-20 DIAGNOSIS — I10 Essential (primary) hypertension: Secondary | ICD-10-CM

## 2022-08-20 MED ORDER — LISINOPRIL 20 MG PO TABS
20.0000 mg | ORAL_TABLET | Freq: Every day | ORAL | 1 refills | Status: DC
  Filled 2022-08-20: qty 90, 90d supply, fill #0

## 2022-08-20 MED ORDER — SITAGLIPTIN PHOSPHATE 100 MG PO TABS
ORAL_TABLET | Freq: Every day | ORAL | 1 refills | Status: DC
  Filled 2022-08-20: qty 90, fill #0

## 2022-08-20 NOTE — Telephone Encounter (Signed)
Routed to PCP 

## 2022-08-21 ENCOUNTER — Other Ambulatory Visit: Payer: Self-pay

## 2022-09-02 ENCOUNTER — Other Ambulatory Visit: Payer: Self-pay

## 2022-09-16 ENCOUNTER — Other Ambulatory Visit: Payer: Self-pay

## 2022-11-03 ENCOUNTER — Other Ambulatory Visit: Payer: Self-pay

## 2022-11-12 ENCOUNTER — Ambulatory Visit (INDEPENDENT_AMBULATORY_CARE_PROVIDER_SITE_OTHER): Payer: Self-pay | Admitting: Primary Care

## 2023-02-16 ENCOUNTER — Other Ambulatory Visit: Payer: Self-pay

## 2023-11-30 IMAGING — CT CT NECK W/ CM
3 of 5 series · 11 of 34 positions shown, 13 images · IV contrast (APPLIED)
Comparison: Face CT today reported separately. CTA neck 10/11/2019.

CLINICAL DATA: 66-year-old female with otalgia. Pain radiating to
the left face and neck. Painful swallowing.

EXAM:
CT NECK WITH CONTRAST
TECHNIQUE: Multidetector CT imaging of the neck was performed using the
standard protocol following the bolus administration of intravenous
contrast.

[Series 5: sag neck · sagittal · 0.41mm/px · 5 of 184 slices shown, 6 images]
[im 62/184  bone]
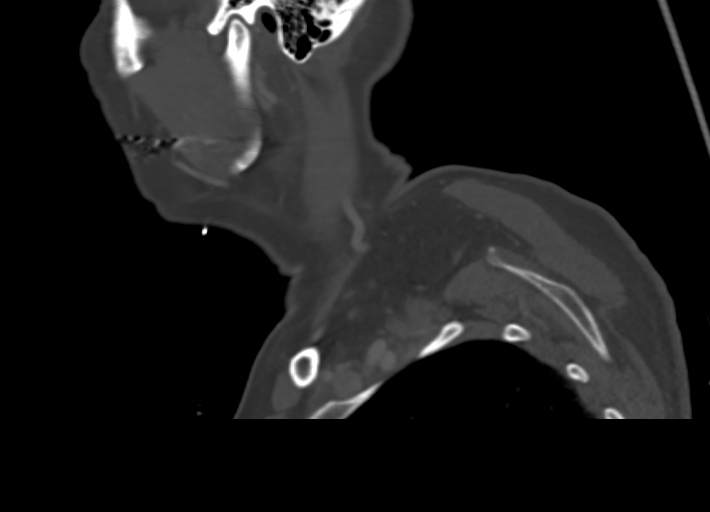
[im 77/184  bone]
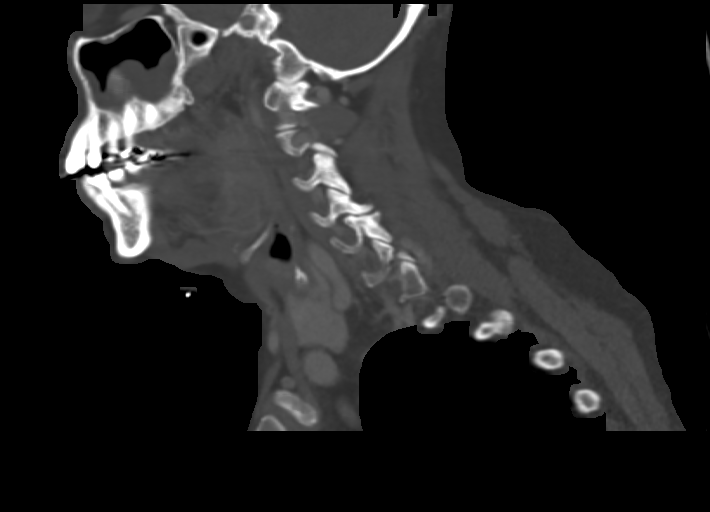
[im 92/184  soft-tissue]
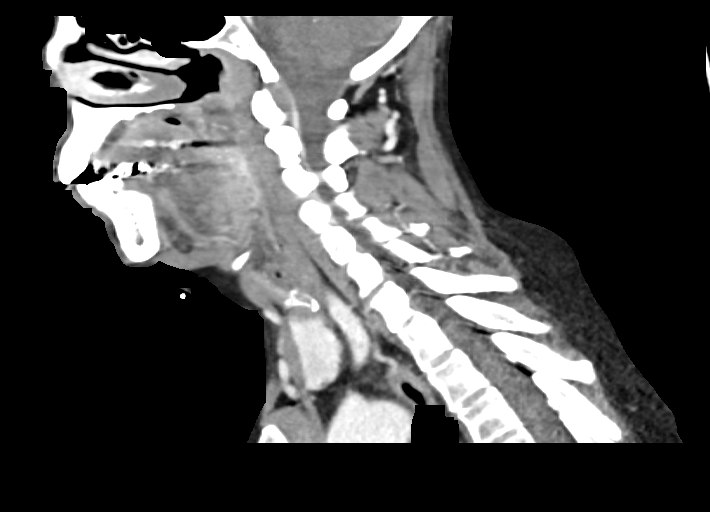
[im 92/184  bone]
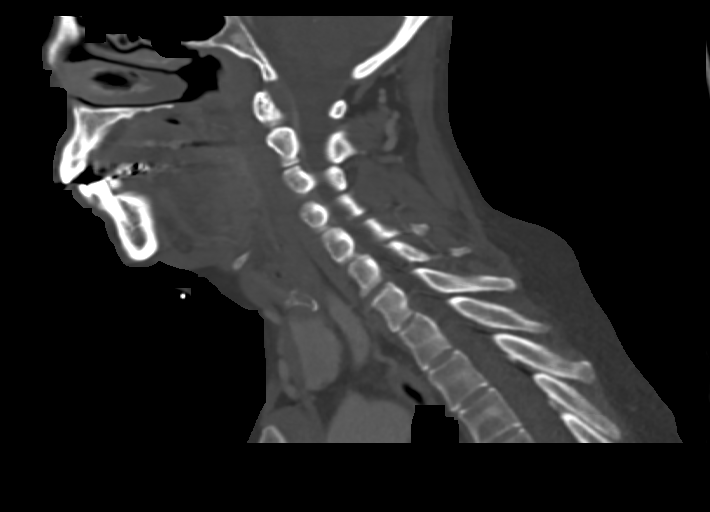
[im 107/184  bone]
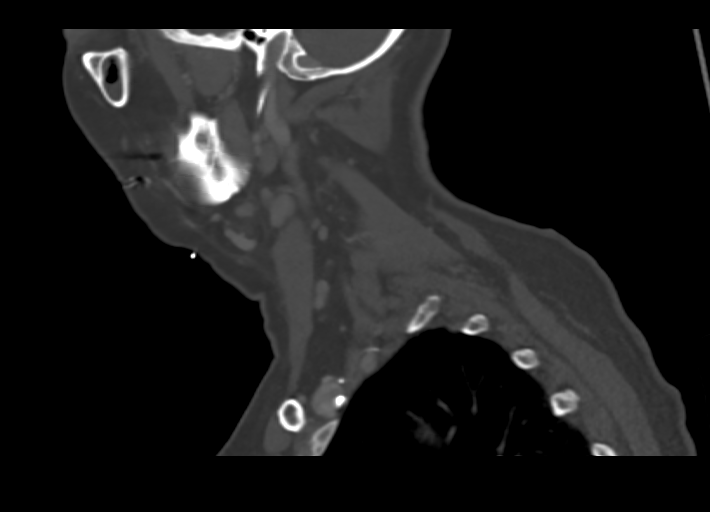
[im 122/184  bone]
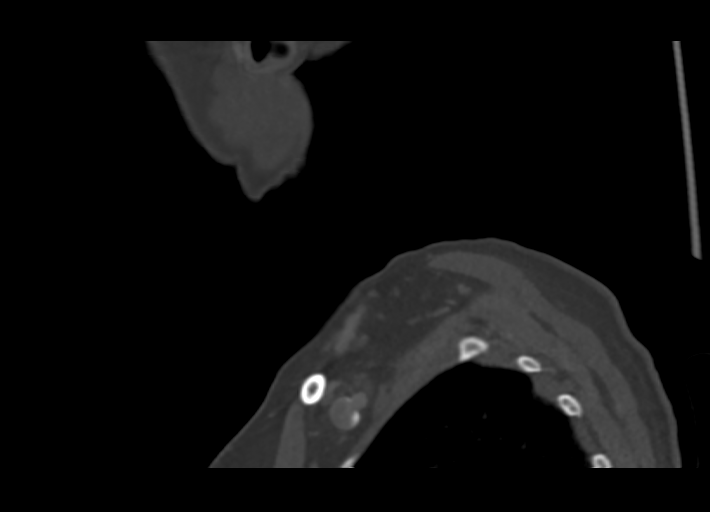

[Series 6: cor neck · coronal · 0.41mm/px · 3 of 148 slices shown]
[im 30/148  bone]
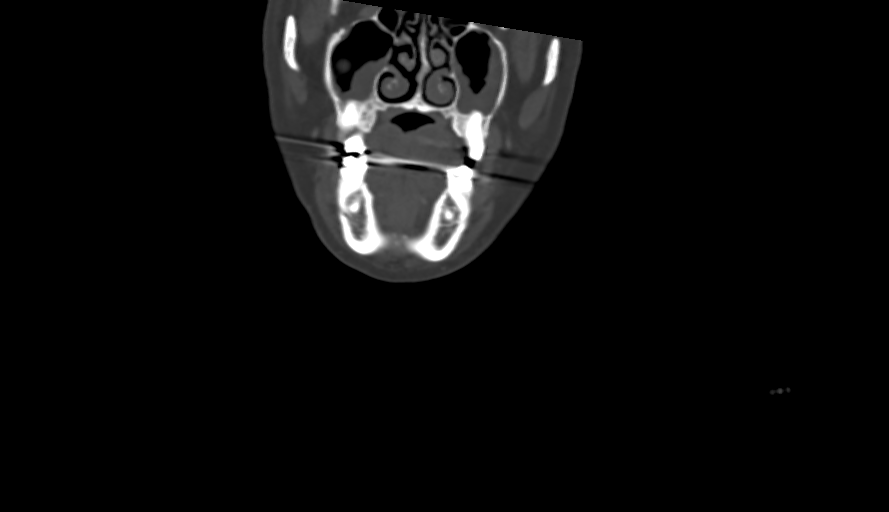
[im 59/148  bone]
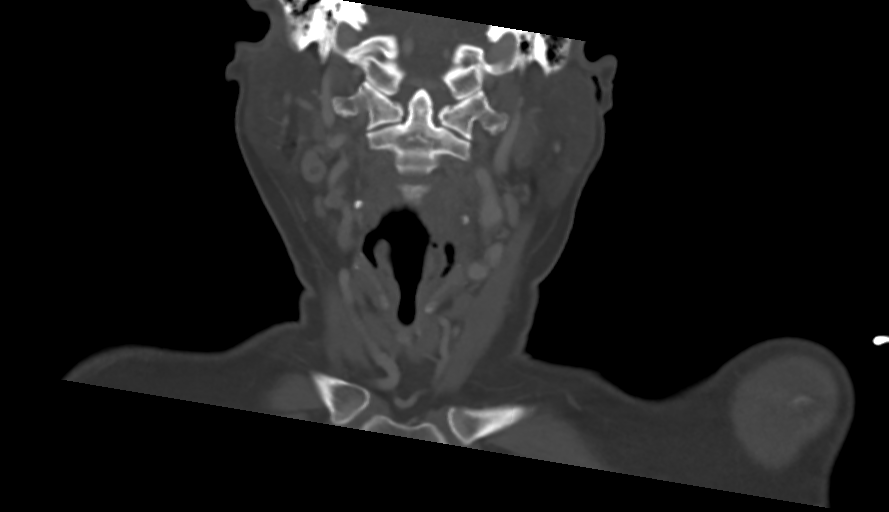
[im 89/148  bone]
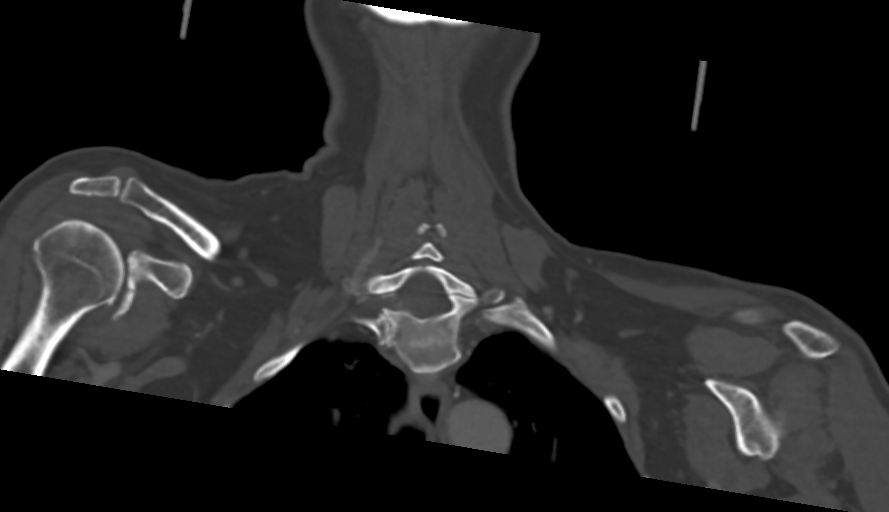

[Series 7: ax oropharynx · axial · 0.57mm/px · z∈[-772,-669]mm · 3 of 106 slices shown, 4 images]
[im 27/106  soft-tissue]
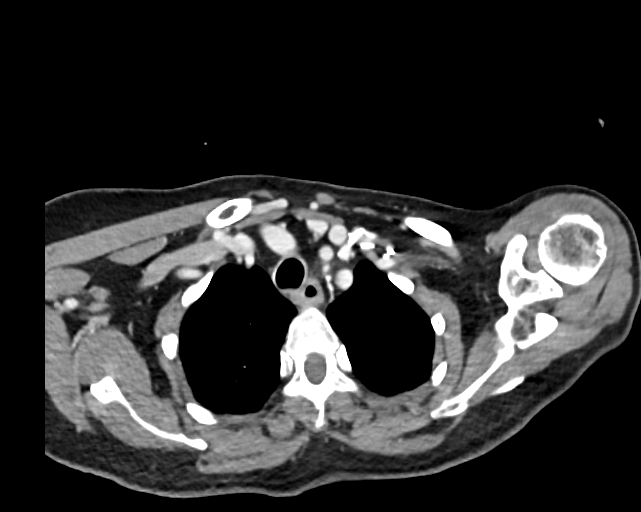
[im 27/106  bone]
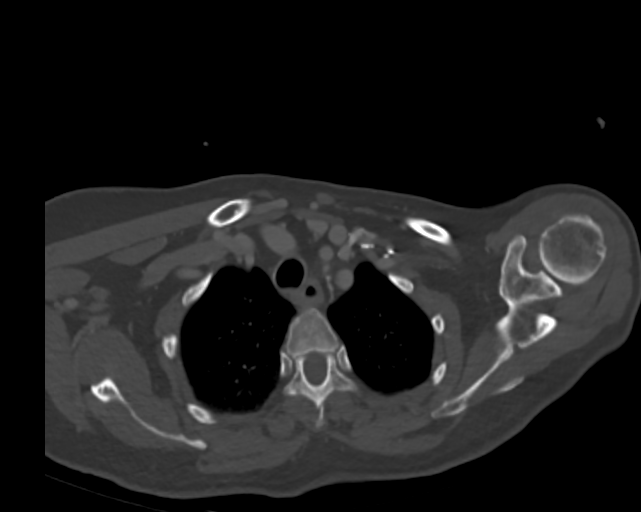
[im 53/106  bone]
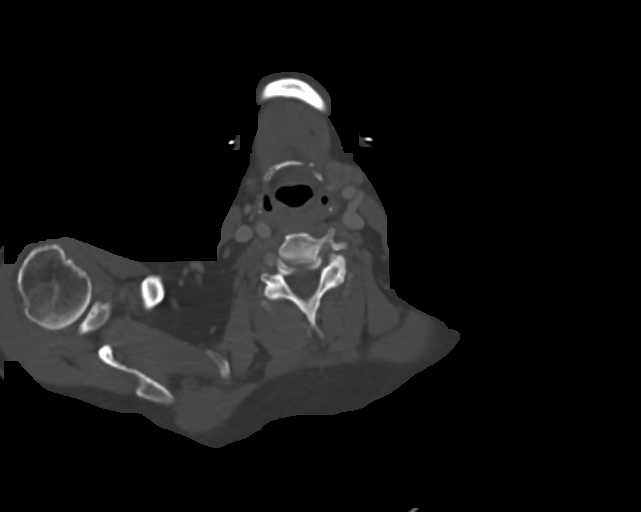
[im 79/106  bone]
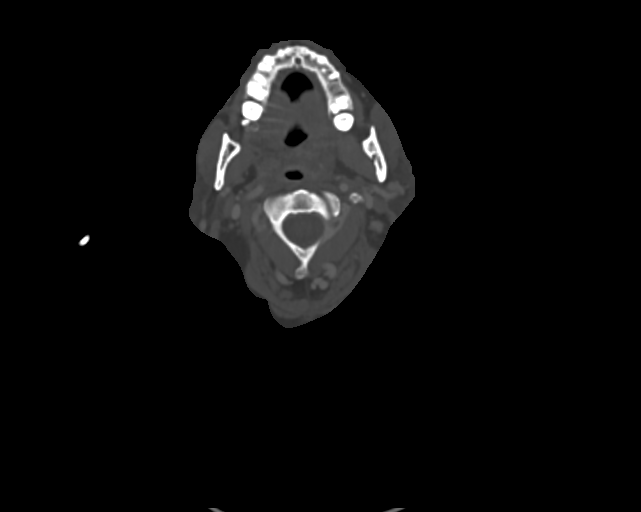

[11 of 34 positions shown; findings below may reference images not displayed]

RADIATION DOSE REDUCTION: This exam was performed according to the
departmental dose-optimization program which includes automated
exposure control, adjustment of the mA and/or kV according to
patient size and/or use of iterative reconstruction technique.

CONTRAST:  100mL OMNIPAQUE IOHEXOL 300 MG/ML  SOLN
FINDINGS: Pharynx and larynx: Larynx and epiglottis remain within normal
limits.

Generalized lingual, palatine tonsillar and adenoid
hyperenhancement. Lingual greater than palatine tonsil enlargement.
Postinflammatory palatine tonsil calcifications. Some trapped fluid
suspected within the left tonsil (only 2-3 mm on series 3, image
28). No discrete tonsillar abscess. Subtle inflammation in the left
parapharyngeal space. Right parapharyngeal and retropharyngeal
spaces remain normal.

Salivary glands: Negative sublingual space. Partially atrophied
submandibular glands may be mildly hyperenhancing. There is mild
asymmetric left submandibular space inflammation (series 6, image
52). No sialolithiasis or submandibular duct enlargement. Parotid
glands appear symmetric and within normal limits.

Thyroid: Negative.

Lymph nodes: Subcentimeter but enhancing cervical lymph nodes such
as right level 2 A series 3, image 34. No abnormally enlarged,
cystic or necrotic nodes.

Vascular: Major vascular structures in the neck and at the skull
base remain patent including the left IJ. Carotid bifurcation
atherosclerosis.

Limited intracranial: Negative.

Visualized orbits: Minimally included.

Mastoids and visualized paranasal sinuses: Reported separately.

Skeleton: Carious right posterior maxillary dentition. No acute
osseous abnormality identified.

Upper chest: Negative.
IMPRESSION: 1. Acute Tonsillitis with generalized tonsillar and adenoid
hyperenhancement. No discrete tonsillar abscess.
2. Mild inflammation in both the left parapharyngeal and left
submandibular spaces. And possible acute infectious submandibular
sialadenitis.
3. Reactive appearing cervical lymph nodes.
4. Carious right posterior maxillary dentition.
5. CT Face today reported separately.

## 2024-11-18 ENCOUNTER — Other Ambulatory Visit: Payer: Self-pay

## 2024-11-18 ENCOUNTER — Observation Stay (HOSPITAL_COMMUNITY)
Admission: EM | Admit: 2024-11-18 | Discharge: 2024-11-20 | Disposition: A | Payer: Self-pay | Attending: Internal Medicine | Admitting: Internal Medicine

## 2024-11-18 ENCOUNTER — Emergency Department (HOSPITAL_COMMUNITY): Payer: Self-pay

## 2024-11-18 ENCOUNTER — Inpatient Hospital Stay (HOSPITAL_COMMUNITY): Payer: Self-pay

## 2024-11-18 ENCOUNTER — Encounter (HOSPITAL_COMMUNITY): Payer: Self-pay | Admitting: Emergency Medicine

## 2024-11-18 DIAGNOSIS — M978XXA Periprosthetic fracture around other internal prosthetic joint, initial encounter: Secondary | ICD-10-CM | POA: Diagnosis present

## 2024-11-18 DIAGNOSIS — I639 Cerebral infarction, unspecified: Secondary | ICD-10-CM | POA: Diagnosis present

## 2024-11-18 DIAGNOSIS — S7291XA Unspecified fracture of right femur, initial encounter for closed fracture: Secondary | ICD-10-CM | POA: Diagnosis present

## 2024-11-18 DIAGNOSIS — I10 Essential (primary) hypertension: Secondary | ICD-10-CM

## 2024-11-18 DIAGNOSIS — E119 Type 2 diabetes mellitus without complications: Secondary | ICD-10-CM

## 2024-11-18 DIAGNOSIS — Z760 Encounter for issue of repeat prescription: Secondary | ICD-10-CM

## 2024-11-18 DIAGNOSIS — M9701XA Periprosthetic fracture around internal prosthetic right hip joint, initial encounter: Principal | ICD-10-CM

## 2024-11-18 DIAGNOSIS — S7291XK Unspecified fracture of right femur, subsequent encounter for closed fracture with nonunion: Secondary | ICD-10-CM

## 2024-11-18 DIAGNOSIS — R52 Pain, unspecified: Secondary | ICD-10-CM

## 2024-11-18 LAB — CBC WITH DIFFERENTIAL/PLATELET
Abs Immature Granulocytes: 0.16 K/uL — ABNORMAL HIGH (ref 0.00–0.07)
Basophils Absolute: 0 K/uL (ref 0.0–0.1)
Basophils Relative: 1 %
Eosinophils Absolute: 0.2 K/uL (ref 0.0–0.5)
Eosinophils Relative: 2 %
HCT: 40.7 % (ref 36.0–46.0)
Hemoglobin: 13.5 g/dL (ref 12.0–15.0)
Immature Granulocytes: 2 %
Lymphocytes Relative: 23 %
Lymphs Abs: 1.8 K/uL (ref 0.7–4.0)
MCH: 29.9 pg (ref 26.0–34.0)
MCHC: 33.2 g/dL (ref 30.0–36.0)
MCV: 90.2 fL (ref 80.0–100.0)
Monocytes Absolute: 0.5 K/uL (ref 0.1–1.0)
Monocytes Relative: 7 %
Neutro Abs: 5.2 K/uL (ref 1.7–7.7)
Neutrophils Relative %: 65 %
Platelets: 270 K/uL (ref 150–400)
RBC: 4.51 MIL/uL (ref 3.87–5.11)
RDW: 12.9 % (ref 11.5–15.5)
WBC: 7.9 K/uL (ref 4.0–10.5)
nRBC: 0 % (ref 0.0–0.2)

## 2024-11-18 LAB — GLUCOSE, CAPILLARY
Glucose-Capillary: 133 mg/dL — ABNORMAL HIGH (ref 70–99)
Glucose-Capillary: 151 mg/dL — ABNORMAL HIGH (ref 70–99)

## 2024-11-18 LAB — HEMOGLOBIN A1C
Hgb A1c MFr Bld: 9.1 % — ABNORMAL HIGH (ref 4.8–5.6)
Mean Plasma Glucose: 214.47 mg/dL

## 2024-11-18 LAB — BASIC METABOLIC PANEL WITH GFR
Anion gap: 13 (ref 5–15)
BUN: 17 mg/dL (ref 8–23)
CO2: 24 mmol/L (ref 22–32)
Calcium: 10 mg/dL (ref 8.9–10.3)
Chloride: 104 mmol/L (ref 98–111)
Creatinine, Ser: 0.87 mg/dL (ref 0.44–1.00)
GFR, Estimated: >60 mL/min (ref >60)
Glucose, Bld: 114 mg/dL — ABNORMAL HIGH (ref 70–99)
Potassium: 3.9 mmol/L (ref 3.5–5.1)
Sodium: 141 mmol/L (ref 135–145)

## 2024-11-18 MED ORDER — DOCUSATE SODIUM 100 MG PO CAPS
100.0000 mg | ORAL_CAPSULE | Freq: Two times a day (BID) | ORAL | Status: DC
  Administered 2024-11-19 – 2024-11-20 (×3): 100 mg via ORAL
  Filled 2024-11-18 (×3): qty 1

## 2024-11-18 MED ORDER — IBUPROFEN 400 MG PO TABS
600.0000 mg | ORAL_TABLET | Freq: Four times a day (QID) | ORAL | Status: DC
  Administered 2024-11-18 – 2024-11-20 (×7): 600 mg via ORAL
  Filled 2024-11-18 (×3): qty 1
  Filled 2024-11-18: qty 3
  Filled 2024-11-18 (×3): qty 1

## 2024-11-18 MED ORDER — SODIUM CHLORIDE 0.9% FLUSH
3.0000 mL | Freq: Two times a day (BID) | INTRAVENOUS | Status: DC
  Administered 2024-11-18 – 2024-11-20 (×4): 3 mL via INTRAVENOUS

## 2024-11-18 MED ORDER — ENOXAPARIN SODIUM 40 MG/0.4ML IJ SOSY
40.0000 mg | PREFILLED_SYRINGE | Freq: Every day | INTRAMUSCULAR | Status: DC
  Administered 2024-11-18 – 2024-11-19 (×2): 40 mg via SUBCUTANEOUS
  Filled 2024-11-18 (×2): qty 0.4

## 2024-11-18 MED ORDER — HYDROMORPHONE HCL 1 MG/ML IJ SOLN
1.0000 mg | Freq: Once | INTRAMUSCULAR | Status: AC
  Administered 2024-11-18: 1 mg via INTRAVENOUS
  Filled 2024-11-18: qty 1

## 2024-11-18 MED ORDER — OXYCODONE HCL 5 MG PO TABS
5.0000 mg | ORAL_TABLET | ORAL | Status: DC | PRN
  Administered 2024-11-19: 5 mg via ORAL
  Filled 2024-11-18: qty 1

## 2024-11-18 MED ORDER — INSULIN ASPART 100 UNIT/ML IJ SOLN
0.0000 [IU] | Freq: Three times a day (TID) | INTRAMUSCULAR | Status: DC
  Administered 2024-11-19: 1 [IU] via SUBCUTANEOUS
  Filled 2024-11-18: qty 1

## 2024-11-18 MED ORDER — ONDANSETRON HCL 4 MG/2ML IJ SOLN: 4.0000 mg | Freq: Four times a day (QID) | INTRAMUSCULAR | Status: DC | PRN

## 2024-11-18 MED ORDER — HYDROMORPHONE HCL 1 MG/ML IJ SOLN: 1.0000 mg | INTRAMUSCULAR | Status: DC | PRN

## 2024-11-18 MED ORDER — ACETAMINOPHEN 500 MG PO TABS
1000.0000 mg | ORAL_TABLET | Freq: Once | ORAL | Status: AC
  Administered 2024-11-18: 1000 mg via ORAL
  Filled 2024-11-18: qty 2

## 2024-11-18 MED ORDER — HYDROMORPHONE HCL 1 MG/ML IJ SOLN: 0.5000 mg | Freq: Once | INTRAMUSCULAR | Status: DC

## 2024-11-18 MED ORDER — ONDANSETRON HCL 4 MG PO TABS: 4.0000 mg | ORAL_TABLET | Freq: Four times a day (QID) | ORAL | Status: DC | PRN

## 2024-11-18 MED ORDER — HYDROMORPHONE HCL 1 MG/ML IJ SOLN
0.5000 mg | Freq: Once | INTRAMUSCULAR | Status: AC
  Administered 2024-11-18: 0.5 mg via INTRAVENOUS
  Filled 2024-11-18: qty 1

## 2024-11-18 NOTE — H&P (Addendum)
 History and Physical    Patient: Cheryl Massey FMW:[id number] DOB: Feb 07, 1956 DOA: 11/18/2024 DOS: the patient was seen and examined on 11/18/2024 PCP: Pcp, No  Patient coming from: Home  Chief Complaint:  Chief Complaint  Patient presents with   Fall   Leg Injury   HPI: Cheryl Massey Cheryl Massey is a 68 y.o. female with medical history significant for history of stroke with some residual speaking and writing difficulties, DMT2, and hypertension who presents after a fall this morning.  I used a nurse, learning disability to get the history from the patient.  She denied feeling dizzy or lightheaded.  She was in the dining room and says she turned and just fell.  She says she felt fine.  She did not lose consciousness.   She denies any headache or chest pain or shortness of breath.  She lives with family and walks independently. In the emergency department her initial x-rays revealed a fracture of the right hip.  The patient was in a great deal of pain.  Hospitalist were called to admit her to the hospital for intractable pain even if she does not end up needing surgery.  Her workup is ongoing as her CT is still pending.  Review of Systems: As mentioned in the history of present illness. All other systems reviewed and are negative. Past Medical History:  Diagnosis Date   Arthritis    hands   Diabetes mellitus without complication (HCC)    Hypertension    Stroke (HCC) 03/28/2019   Past Surgical History:  Procedure Laterality Date   ABDOMINAL HYSTERECTOMY     APPENDECTOMY     arthroplasty Right 2005   TOTAL HIP ARTHROPLASTY Left 03/11/2021   TOTAL HIP ARTHROPLASTY Left 03/11/2021   Procedure: LEFT TOTAL HIP ARTHROPLASTY ANTERIOR APPROACH;  Surgeon: Jerri Kay HERO, MD;  Location: [facility] OR;  Service: Orthopedics;  Laterality: Left;   Social History:  reports that she has never smoked. She has never used smokeless tobacco. She reports that she does not drink alcohol and does not use  drugs.  No Known Allergies  Family History  Problem Relation Age of Onset   Hypertension Mother    Hypertension Father     Prior to Admission medications   Medication Sig Start Date End Date Taking? Authorizing Provider  aspirin  EC 81 MG tablet Take 1 tablet (81 mg total) by mouth 2 (two) times daily. To be taken after surgery 03/07/21   Jule Ronal CROME, PA-C  fluticasone  (FLONASE ) 50 MCG/ACT nasal spray Place 2 sprays into both nostrils daily. 02/05/22   Celestia Rosaline SQUIBB, NP  gemfibrozil  (LOPID ) 600 MG tablet Take 1 tablet (600 mg total) by mouth 2 (two) times daily before a meal. 05/15/22   Celestia Rosaline SQUIBB, NP  glipiZIDE  (GLUCOTROL ) 10 MG tablet Take 1 tablet (10 mg total) by mouth 2 (two) times daily before a meal. 08/05/22   Celestia Rosaline SQUIBB, NP  lisinopril  (ZESTRIL ) 20 MG tablet Take 1 tablet (20 mg total) by mouth daily. 08/20/22   Celestia Rosaline SQUIBB, NP  metFORMIN  (GLUCOPHAGE ) 1000 MG tablet Take 1 tablet (1,000 mg total) by mouth 2 (two) times daily with a meal. 05/05/22   Celestia Rosaline SQUIBB, NP  sitaGLIPtin  (JANUVIA ) 100 MG tablet TAKE 1 TABLET (100 MG TOTAL) BY MOUTH DAILY. 08/20/22 08/20/23  Celestia Rosaline SQUIBB, NP    Physical Exam: Vitals:   11/18/24 1115 11/18/24 1219 11/18/24 1245  BP: (!) 178/79  (!) 153/70  Pulse: ROLLEN)  52  (!) 58  Resp: 13  12  Temp: 98.5 F (36.9 C)    TempSrc: Oral    SpO2: 100% 100% 94%   Physical Exam:  General: No acute distress, well developed, well nourished HEENT: Normocephalic, atraumatic, PERRL Cardiovascular: Normal rate and rhythm. Distal pulses intact. Pulmonary: Normal pulmonary effort, normal breath sounds Gastrointestinal: Nondistended abdomen, soft, non-tender, normoactive bowel sounds Musculoskeletal:No lower ext edema Lymphadenopathy: No cervical LAD Skin: Skin is warm and dry. Neuro: Strength testing deferred because of known fracture, AAOx3. PSYCH: Attentive and cooperative  Data Reviewed:  Results for orders placed  or performed during the hospital encounter of 11/18/24 (from the past 24 hours)  Basic metabolic panel     Status: Abnormal   Collection Time: 11/18/24 12:21 PM  Result Value Ref Range   Sodium 141 135 - 145 mmol/L   Potassium 3.9 3.5 - 5.1 mmol/L   Chloride 104 98 - 111 mmol/L   CO2 24 22 - 32 mmol/L   Glucose, Bld 114 (H) 70 - 99 mg/dL   BUN 17 8 - 23 mg/dL   Creatinine, Ser 9.12 0.44 - 1.00 mg/dL   Calcium  10.0 8.9 - 10.3 mg/dL   GFR, Estimated >39 >39 mL/min   Anion gap 13 5 - 15  CBC with Differential     Status: Abnormal   Collection Time: 11/18/24 12:21 PM  Result Value Ref Range   WBC 7.9 4.0 - 10.5 K/uL   RBC 4.51 3.87 - 5.11 MIL/uL   Hemoglobin 13.5 12.0 - 15.0 g/dL   HCT 59.2 63.9 - 53.9 %   MCV 90.2 80.0 - 100.0 fL   MCH 29.9 26.0 - 34.0 pg   MCHC 33.2 30.0 - 36.0 g/dL   RDW 87.0 88.4 - 84.4 %   Platelets 270 150 - 400 K/uL   nRBC 0.0 0.0 - 0.2 %   Neutrophils Relative % 65 %   Neutro Abs 5.2 1.7 - 7.7 K/uL   Lymphocytes Relative 23 %   Lymphs Abs 1.8 0.7 - 4.0 K/uL   Monocytes Relative 7 %   Monocytes Absolute 0.5 0.1 - 1.0 K/uL   Eosinophils Relative 2 %   Eosinophils Absolute 0.2 0.0 - 0.5 K/uL   Basophils Relative 1 %   Basophils Absolute 0.0 0.0 - 0.1 K/uL   Immature Granulocytes 2 %   Abs Immature Granulocytes 0.16 (H) 0.00 - 0.07 K/uL     Assessment and Plan: Fall with right femur fracture in patient with history of TKA - CT pending - Dr. Sherida of [facility] consulted - Patient reporting 9 out of 10 pain at the time of my evaluation. For pain control will start with scheduled Nsaids and Tylenol  and prn narcotics - Initial recommendations include PT/OT and 50%weight bearing  2. DMT2 - Will hold metformin .  Corrective dose insulin  for now  3. Htn - Will hold Cozaar .  Restart as needed  4. ASCVD - Will hold aspirin  and a.m. in case she needs to go to the OR.    Advance Care Planning:   Code Status: Prior  The patient names her daughter  Racquel as her surrogate decision maker.  She will be full code by default.  CODE STATUS discussion was deferred.  Consults: Dr Sherida orthopedics  Family Communication: The patient's grandson is at bedside  Severity of Illness: The appropriate patient status for this patient is INPATIENT. Inpatient status is judged to be reasonable and necessary in order to provide the required  intensity of service to ensure the patient's safety. The patient's presenting symptoms, physical exam findings, and initial radiographic and laboratory data in the context of their chronic comorbidities is felt to place them at high risk for further clinical deterioration. Furthermore, it is not anticipated that the patient will be medically stable for discharge from the hospital within 2 midnights of admission.   * I certify that at the point of admission it is my clinical judgment that the patient will require inpatient hospital care spanning beyond 2 midnights from the point of admission due to high intensity of service, high risk for further deterioration and high frequency of surveillance required.*  Author: ARTHEA CHILD, MD 11/18/2024 3:24 PM  For on call review [email or web address].

## 2024-11-18 NOTE — ED Provider Notes (Signed)
 [place] EMERGENCY DEPARTMENT AT [facility] Provider Note  CSN: [id number] Arrival date & time: 11/18/24 1100  Chief Complaint(s) Fall and Leg Injury  HPI Cheryl Massey Cheryl Massey is a 68 y.o. female history of diabetes, prior stroke with chronic aphasia presenting with fall.  Patient's granddaughter reports she was with the patient, patient had a fall after tripping on something, landing on the right leg.  Denies any head injury or loss of consciousness.  No syncope.  No chest pain or shortness of breath.  No fevers or chills.  No numbness or tingling.  History is slightly limited due to aphasia but patient demonstrates comprehension and able to answer questions with yes or no  Spanish interpreter used.   Past Medical History Past Medical History:  Diagnosis Date   Arthritis    hands   Diabetes mellitus without complication (HCC)    Hypertension    Stroke (HCC) 03/28/2019   Patient Active Problem List   Diagnosis Date Noted   Femur fracture, right (HCC) 11/18/2024   Intractable pain 11/18/2024   Nontraumatic incomplete tear of left rotator cuff 03/11/2022   Arthritis of left acromioclavicular joint 03/11/2022   Tendinopathy of left biceps 03/11/2022   Primary osteoarthritis of left knee 04/24/2021   Status post total replacement of left hip 03/11/2021   Primary osteoarthritis of left hip 10/05/2020   CVA (cerebral vascular accident) (HCC) 10/11/2019   CVA (cerebrovascular accident) (HCC) 10/11/2019   TIA (transient ischemic attack) 03/28/2019   Essential hypertension 03/28/2019   Type 2 diabetes mellitus treated without insulin  (HCC) 03/28/2019   Home Medication(s) Prior to Admission medications   Medication Sig Start Date End Date Taking? Authorizing Provider  aspirin  EC 81 MG tablet Take 1 tablet (81 mg total) by mouth 2 (two) times daily. To be taken after surgery 03/07/21   Jule Ronal CROME, PA-C  fluticasone  (FLONASE ) 50 MCG/ACT nasal spray Place 2  sprays into both nostrils daily. 02/05/22   Celestia Rosaline SQUIBB, NP  gemfibrozil  (LOPID ) 600 MG tablet Take 1 tablet (600 mg total) by mouth 2 (two) times daily before a meal. 05/15/22   Celestia Rosaline SQUIBB, NP  glipiZIDE  (GLUCOTROL ) 10 MG tablet Take 1 tablet (10 mg total) by mouth 2 (two) times daily before a meal. 08/05/22   Celestia Rosaline SQUIBB, NP  lisinopril  (ZESTRIL ) 20 MG tablet Take 1 tablet (20 mg total) by mouth daily. 08/20/22   Celestia Rosaline SQUIBB, NP  metFORMIN  (GLUCOPHAGE ) 1000 MG tablet Take 1 tablet (1,000 mg total) by mouth 2 (two) times daily with a meal. 05/05/22   Celestia Rosaline SQUIBB, NP  sitaGLIPtin  (JANUVIA ) 100 MG tablet TAKE 1 TABLET (100 MG TOTAL) BY MOUTH DAILY. 08/20/22 08/20/23  Celestia Rosaline SQUIBB, NP  Past Surgical History Past Surgical History:  Procedure Laterality Date   ABDOMINAL HYSTERECTOMY     APPENDECTOMY     arthroplasty Right 2005   TOTAL HIP ARTHROPLASTY Left 03/11/2021   TOTAL HIP ARTHROPLASTY Left 03/11/2021   Procedure: LEFT TOTAL HIP ARTHROPLASTY ANTERIOR APPROACH;  Surgeon: Jerri Kay HERO, MD;  Location: [facility] OR;  Service: Orthopedics;  Laterality: Left;   Family History Family History  Problem Relation Age of Onset   Hypertension Mother    Hypertension Father     Social History Social History   Tobacco Use   Smoking status: Never   Smokeless tobacco: Never  Vaping Use   Vaping status: Never Used  Substance Use Topics   Alcohol use: No   Drug use: No   Allergies Patient has no known allergies.  Review of Systems Review of Systems  All other systems reviewed and are negative.   Physical Exam Vital Signs  I have reviewed the triage vital signs BP (!) 153/70   Pulse (!) 58   Temp 98 F (36.7 C) (Oral)   Resp 12   SpO2 94%  Physical Exam Vitals and nursing note reviewed.  Constitutional:      General: She  is not in acute distress.    Appearance: She is well-developed.  HENT:     Head: Normocephalic and atraumatic.     Mouth/Throat:     Mouth: Mucous membranes are moist.  Eyes:     Pupils: Pupils are equal, round, and reactive to light.  Cardiovascular:     Rate and Rhythm: Normal rate and regular rhythm.     Heart sounds: No murmur heard. Pulmonary:     Effort: Pulmonary effort is normal. No respiratory distress.     Breath sounds: Normal breath sounds.  Abdominal:     General: Abdomen is flat.     Palpations: Abdomen is soft.     Tenderness: There is no abdominal tenderness.  Musculoskeletal:        General: No tenderness.     Right lower leg: No edema.     Left lower leg: No edema.     Comments: Tenderness around the right knee, right femur and right hip.  Limitation of range of motion of the right knee and right hip.  No limitation to the ankle.  2+ DP pulse bilaterally.  No other extremity tenderness, no midline C, T, L-spine tenderness.  Skin:    General: Skin is warm and dry.  Neurological:     General: No focal deficit present.     Mental Status: She is alert. Mental status is at baseline.  Psychiatric:        Mood and Affect: Mood normal.        Behavior: Behavior normal.     ED Results and Treatments Labs (all labs ordered are listed, but only abnormal results are displayed) Labs Reviewed  BASIC METABOLIC PANEL WITH GFR - Abnormal; Notable for the following components:      Result Value   Glucose, Bld 114 (*)    All other components within normal limits  CBC WITH DIFFERENTIAL/PLATELET - Abnormal; Notable for the following components:   Abs Immature Granulocytes 0.16 (*)    All other components within normal limits  Radiology DG Knee Complete 4 Views Right Result Date: 11/18/2024 EXAM: 4 OR MORE VIEW(S) XRAY OF THE RIGHT KNEE 11/18/2024  01:21:08 PM COMPARISON: None available. CLINICAL HISTORY: fall FINDINGS: BONES AND JOINTS: No acute fracture. No malalignment. No significant joint effusion. Moderate narrowing of medial joint space is noted. Mild osteophyte formation is noted laterally. Mild narrowing of the patellofemoral space is noted. SOFT TISSUES: The soft tissues are unremarkable. IMPRESSION: 1. Moderate narrowing of the medial joint space. 2. Mild lateral osteophyte formation. 3. Mild narrowing of the patellofemoral space. Electronically signed by: Lynwood Seip MD 11/18/2024 01:40 PM EST RP Workstation: [id number]   DG Hip Unilat With Pelvis 2-3 Views Right Result Date: 11/18/2024 EXAM: 2 or 3 VIEW(S) XRAY OF THE RIGHT HIP 11/18/2024 01:21:08 PM COMPARISON: 03/11/2022 CLINICAL HISTORY: fall FINDINGS: BONES AND JOINTS: Status post bilateral total hip arthroplasty. Minimally displaced periprosthetic fracture is seen involving the right proximal femur. No malalignment. SOFT TISSUES: The soft tissues are unremarkable. IMPRESSION: 1. Minimally displaced periprosthetic fracture of the right proximal femur in the setting of bilateral total hip arthroplasty. Electronically signed by: Lynwood Seip MD 11/18/2024 01:37 PM EST RP Workstation: [id number]    Pertinent labs & imaging results that were available during my care of the patient were reviewed by me and considered in my medical decision making (see MDM for details).  Medications Ordered in ED Medications  HYDROmorphone  (DILAUDID ) injection 0.5 mg (0.5 mg Intravenous Given 11/18/24 1229)  acetaminophen  (TYLENOL ) tablet 1,000 mg (1,000 mg Oral Given 11/18/24 1526)  HYDROmorphone  (DILAUDID ) injection 1 mg (1 mg Intravenous Given 11/18/24 1529)                                                                                                                                     Procedures Procedures  (including critical care time)  Medical Decision Making / ED  Course   MDM:  68 year old presenting after fall.  Patient only injury seems to be right leg.  No history of any syncopal episode preceding fall.  Patient and family deny head injury.  Will obtain x-rays of the right hip and right knee.  Differential includes fracture, dislocation, periprosthetic fracture, contusion.  Will treat pain.  Will reassess.  Clinical Course as of 11/18/24 1552  Fri Nov 18, 2024  1357 XR shows right hip periprosthetic fracture, discussed with Dr. Genelle who will review case [WS]  1423 Dr. Genelle reports patient is out of care of [facility] as they haven't seen patient for  > 2 years. Will consult on-call orthopedic [WS]  1540 Discussed with Dr. Sherida for [facility] orthopedics.  He recommends CT scan to determine extent of fracture but suspects there is localized area seen on x-ray, does not think patient will need operative intervention, if no further fracture found, patient can follow-up outpatient but agrees with patient to be admitted for PT and pain control.  Discussed with Dr. Arthea who will admit  patient. [WS]    Clinical Course User Index [WS] Francesca Elsie CROME, MD     Additional history obtained: -Additional history obtained from ems -External records from outside source obtained and reviewed including: Chart review including previous notes, labs, imaging, consultation notes including prior notes    Lab Tests: -I ordered, reviewed, and interpreted labs.   The pertinent results include:   Labs Reviewed  BASIC METABOLIC PANEL WITH GFR - Abnormal; Notable for the following components:      Result Value   Glucose, Bld 114 (*)    All other components within normal limits  CBC WITH DIFFERENTIAL/PLATELET - Abnormal; Notable for the following components:   Abs Immature Granulocytes 0.16 (*)    All other components within normal limits    Notable for mild hyperglycemia   Imaging Studies ordered: I ordered imaging studies including XR hip On my  interpretation imaging demonstrates periprosthetic fracture  I independently visualized and interpreted imaging. I agree with the radiologist interpretation   Medicines ordered and prescription drug management: Meds ordered this encounter  Medications   HYDROmorphone  (DILAUDID ) injection 0.5 mg   DISCONTD: HYDROmorphone  (DILAUDID ) injection 0.5 mg   acetaminophen  (TYLENOL ) tablet 1,000 mg   HYDROmorphone  (DILAUDID ) injection 1 mg    -I have reviewed the patients home medicines and have made adjustments as needed   Consultations Obtained: I requested consultation with the orthopedic surgeon,  and discussed lab and imaging findings as well as pertinent plan - they recommend: admit for pain control/pt, CT hip   Cardiac Monitoring: The patient was maintained on a cardiac monitor.  I personally viewed and interpreted the cardiac monitored which showed an underlying rhythm of: NSR  Reevaluation: After the interventions noted above, I reevaluated the patient and found that their symptoms have improved  Co morbidities that complicate the patient evaluation  Past Medical History:  Diagnosis Date   Arthritis    hands   Diabetes mellitus without complication (HCC)    Hypertension    Stroke (HCC) 03/28/2019      Dispostion: Disposition decision including need for hospitalization was considered, and patient admitted to the hospital.    Final Clinical Impression(s) / ED Diagnoses Final diagnoses:  Periprosthetic fracture around internal prosthetic right hip joint, initial encounter [facility])     This chart was dictated using voice recognition software.  Despite best efforts to proofread,  errors can occur which can change the documentation meaning.    Francesca Elsie CROME, MD 11/18/24 [phone]

## 2024-11-18 NOTE — Plan of Care (Signed)
  Problem: Education: Goal: Knowledge of General Education information will improve Description: Including pain rating scale, medication(s)/side effects and non-pharmacologic comfort measures Outcome: Progressing   Problem: Clinical Measurements: Goal: Ability to maintain clinical measurements within normal limits will improve Outcome: Progressing   Problem: Activity: Goal: Risk for activity intolerance will decrease Outcome: Progressing   Problem: Nutrition: Goal: Adequate nutrition will be maintained Outcome: Progressing   Problem: Elimination: Goal: Will not experience complications related to bowel motility Outcome: Progressing Goal: Will not experience complications related to urinary retention Outcome: Progressing   Problem: Pain Managment: Goal: General experience of comfort will improve and/or be controlled Outcome: Progressing   Problem: Safety: Goal: Ability to remain free from injury will improve Outcome: Progressing

## 2024-11-18 NOTE — Consult Note (Signed)
 ORTHOPAEDIC CONSULTATION  REQUESTING PHYSICIAN: Arthea Child, MD  Chief Complaint: right hip pain  HPI: Cheryl Massey Cheryl Massey is a 68 y.o. female with a mechanical fall onto her right hip. She had immediate pain and difficulty ambulating. She has a history of a right total hip done in [place] in 2005. XR and CT demonstrate a minimally displaced periprosthetic greater trochanter fracture with no evidence of prosthesis loosening. Denies pain elsewhere.  Past Medical History:  Diagnosis Date   Arthritis    hands   Diabetes mellitus without complication (HCC)    Hypertension    Stroke (HCC) 03/28/2019   Past Surgical History:  Procedure Laterality Date   ABDOMINAL HYSTERECTOMY     APPENDECTOMY     arthroplasty Right 2005   TOTAL HIP ARTHROPLASTY Left 03/11/2021   TOTAL HIP ARTHROPLASTY Left 03/11/2021   Procedure: LEFT TOTAL HIP ARTHROPLASTY ANTERIOR APPROACH;  Surgeon: Jerri Kay HERO, MD;  Location: [facility] OR;  Service: Orthopedics;  Laterality: Left;   Social History   Socioeconomic History   Marital status: Married    Spouse name: Not on file   Number of children: Not on file   Years of education: Not on file   Highest education level: Not on file  Occupational History   Not on file  Tobacco Use   Smoking status: Never   Smokeless tobacco: Never  Vaping Use   Vaping status: Never Used  Substance and Sexual Activity   Alcohol use: No   Drug use: No   Sexual activity: Yes    Birth control/protection: Surgical  Other Topics Concern   Not on file  Social History Narrative   Not on file   Social Drivers of Health   Financial Resource Strain: Not on file  Food Insecurity: No Food Insecurity (11/18/2024)   Hunger Vital Sign    Worried About Running Out of Food in the Last Year: Never true    Ran Out of Food in the Last Year: Never true  Transportation Needs: No Transportation Needs (11/18/2024)   PRAPARE - Administrator, Civil Service (Medical): No     Lack of Transportation (Non-Medical): No  Physical Activity: Not on file  Stress: Not on file  Social Connections: Socially Integrated (11/18/2024)   Social Connection and Isolation Panel    Frequency of Communication with Friends and Family: Twice a week    Frequency of Social Gatherings with Friends and Family: Twice a week    Attends Religious Services: 1 to 4 times per year    Active Member of [employer] or Organizations: No    Attends Engineer, Structural: 1 to 4 times per year    Marital Status: Married   Family History  Problem Relation Age of Onset   Hypertension Mother    Hypertension Father    No Known Allergies Prior to Admission medications   Medication Sig Start Date End Date Taking? Authorizing Provider  aspirin  EC 325 MG tablet Take 325 mg by mouth daily with breakfast.   Yes [provider]  losartan  (COZAAR ) 25 MG tablet Take 25 mg by mouth daily.   Yes [provider]  metFORMIN  (GLUCOPHAGE ) 1000 MG tablet Take 1 tablet (1,000 mg total) by mouth 2 (two) times daily with a meal. 05/05/22  Yes Celestia Rosaline SQUIBB, NP  aspirin  EC 81 MG tablet Take 1 tablet (81 mg total) by mouth 2 (two) times daily. To be taken after surgery Patient not taking: Reported on 11/18/2024  03/07/21   Jule Ronal CROME, PA-C  fluticasone  (FLONASE ) 50 MCG/ACT nasal spray Place 2 sprays into both nostrils daily. Patient not taking: Reported on 11/18/2024 02/05/22   Celestia Rosaline SQUIBB, NP  gemfibrozil  (LOPID ) 600 MG tablet Take 1 tablet (600 mg total) by mouth 2 (two) times daily before a meal. Patient not taking: Reported on 11/18/2024 05/15/22   Celestia Rosaline SQUIBB, NP  glipiZIDE  (GLUCOTROL ) 10 MG tablet Take 1 tablet (10 mg total) by mouth 2 (two) times daily before a meal. Patient not taking: Reported on 11/18/2024 08/05/22   Celestia Rosaline SQUIBB, NP  lisinopril  (ZESTRIL ) 20 MG tablet Take 1 tablet (20 mg total) by mouth daily. Patient not taking: Reported on 11/18/2024 08/20/22    Celestia Rosaline SQUIBB, NP  sitaGLIPtin  (JANUVIA ) 100 MG tablet TAKE 1 TABLET (100 MG TOTAL) BY MOUTH DAILY. Patient not taking: Reported on 11/18/2024 08/20/22 11/18/24  Celestia Rosaline SQUIBB, NP    Family History Reviewed and non-contributory, no pertinent history of problems with bleeding or anesthesia      Review of Systems 14 system ROS conducted and negative except for that noted in HPI   OBJECTIVE  Vitals:Patient Vitals for the past 8 hrs:  BP Temp Temp src Pulse Resp SpO2 Weight  11/18/24 1915 139/66 97.6 F (36.4 C) Oral 60 15 99 % --  11/18/24 1713 -- -- -- -- -- -- 64.2 kg  11/18/24 1545 (!) 167/71 -- -- 63 14 90 % --  11/18/24 1542 -- 98 F (36.7 C) Oral -- -- -- --  11/18/24 1245 (!) 153/70 -- -- (!) 58 12 94 % --  11/18/24 1219 -- -- -- -- -- 100 % --   General: Alert, no acute distress Cardiovascular: Warm extremities noted Respiratory: No cyanosis, no use of accessory musculature GI: No organomegaly, abdomen is soft and non-tender Skin: No lesions in the area of chief complaint other than those listed below in MSK exam.  Neurologic: Sensation intact distally save for the below mentioned MSK exam Psychiatric: Patient is competent for consent with normal mood and affect Lymphatic: No swelling obvious and reported other than the area involved in the exam below Extremities  RLE: Posterior approach surgical incision well healed Tender to palpation over the greater trochanter Mild pain with hip ROM Neurovascularly intact distally    Test Results Imaging CT Hip Right Wo Contrast Result Date: 11/18/2024 EXAM: CT OF THE RIGHT HIP WITHOUT IV CONTRAST 11/18/2024 04:25:16 PM TECHNIQUE: CT of the right hip was performed without the administration of intravenous contrast. Multiplanar reformatted images are provided for review. Automated exposure control, iterative reconstruction, and/or weight based adjustment of the mA/kV was utilized to reduce the radiation dose to as low as  reasonably achievable. COMPARISON: Prior radiographs. Prior exams back through 10/05/2020. CLINICAL HISTORY: Right hip periprosthetic fracture. FINDINGS: BONES: Right total hip prosthesis is observed. Periprosthetic fracture involving primarily the greater trochanter extending down to the lateral subtrochanteric region as on image 62 series 6, with up to about 6 mm of displacement anteriorly although less posteriorly likely due to mild rotation of the greater trochanteric fragment. In conjunction with the prior radiographs, there is about 13.5 cm of stem seated within the femoral shaft below the most distal level of the periprosthetic fracture. Below the level of the fracture we do not demonstrate periprosthetic lucency. The very bottom tip of the stem component was inadvertently not included, but is depicted on radiographs without abnormality around the stem tip. The patient also has a  contralateral total hip prosthesis visible on the scout image. SOFT TISSUE: No significant soft tissue edema or fluid collections. No soft tissue mass. JOINT: Right total hip prosthesis is observed. 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 A small degenerative subcortical cystic lesion is present in the right acetabulum just above the acetabular shell component as on image 62 series 6, probably a preexisting geode and less likely to be due to early particulate disease. INTRAPELVIC CONTENTS: Limited images of the intrapelvic contents are unremarkable. IMPRESSION: 1. Right periprosthetic fracture involving primarily the greater trochanter extending to the lateral subtrochanteric region, with up to approximately 6 mm anterior displacement and less posteriorly, likely due to mild rotation of the greater trochanteric fragment. 2. Small degenerative subcortical cystic lesion in the right acetabulum above the  acetabular shell component, likely a preexisting geode. 3. 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 Electronically signed by: Ryan Salvage MD 11/18/2024 04:36 PM EST RP Workstation: [id number]   DG Knee Complete 4 Views Right Result Date: 11/18/2024 EXAM: 4 OR MORE VIEW(S) XRAY OF THE RIGHT KNEE 11/18/2024 01:21:08 PM COMPARISON: None available. CLINICAL HISTORY: fall FINDINGS: BONES AND JOINTS: No acute fracture. No malalignment. No significant joint effusion. Moderate narrowing of medial joint space is noted. Mild osteophyte formation is noted laterally. Mild narrowing of the patellofemoral space is noted. SOFT TISSUES: The soft tissues are unremarkable. IMPRESSION: 1. Moderate narrowing of the medial joint space. 2. Mild lateral osteophyte formation. 3. Mild narrowing of the patellofemoral space. Electronically signed by: Lynwood Seip MD 11/18/2024 01:40 PM EST RP Workstation: [id number]   DG Hip Unilat With Pelvis 2-3 Views Right Result Date: 11/18/2024 EXAM: 2 or 3 VIEW(S) XRAY OF THE RIGHT HIP 11/18/2024 01:21:08 PM COMPARISON: 03/11/2022 CLINICAL HISTORY: fall FINDINGS: BONES AND JOINTS: Status post bilateral total hip arthroplasty. Minimally displaced periprosthetic fracture is seen involving the right proximal femur. No malalignment. SOFT TISSUES: The soft tissues are unremarkable. IMPRESSION: 1. Minimally displaced periprosthetic fracture of the right proximal femur in the setting of bilateral total hip arthroplasty. Electronically signed by: Lynwood Seip MD 11/18/2024 01:37 PM EST RP Workstation: [id number]   Labs cbc Recent Labs    11/18/24 1221  WBC 7.9  HGB 13.5  HCT 40.7  PLT 270    Labs inflam No results for input(s): CRP in the last 72 hours.  Invalid input(s): ESR  Labs coag No results for input(s): INR, PTT in the last 72 hours.  Invalid input(s):  PT  Recent Labs    11/18/24 1221  NA 141  K 3.9  CL 104  CO2 24  GLUCOSE 114*  BUN 17  CREATININE 0.87  CALCIUM  10.0     ASSESSMENT AND PLAN: 68 y.o. female with the following: right hip periprosthetic greater trochanter fracture. No operative intervention needed  Patient can be PWB 50% with a rolling walker with no active hip abduction  Pain control  PT/OT  Follow-up plan: Follow up 2 weeks after discharge for new XR

## 2024-11-18 NOTE — ED Triage Notes (Signed)
 Pt BIB EMS from home after mechanical fall. Shortening, rotating right leg. Hx of bilateral hip replace and right knee replacement. No LOC, head injury, or blood thinner. Denies dizzy  BP 172/100 P 94 SpO2 98% CBG 126

## 2024-11-19 DIAGNOSIS — M978XXA Periprosthetic fracture around other internal prosthetic joint, initial encounter: Secondary | ICD-10-CM | POA: Diagnosis present

## 2024-11-19 DIAGNOSIS — Z96649 Presence of unspecified artificial hip joint: Secondary | ICD-10-CM

## 2024-11-19 LAB — BASIC METABOLIC PANEL WITH GFR
Anion gap: 11 (ref 5–15)
BUN: 17 mg/dL (ref 8–23)
CO2: 23 mmol/L (ref 22–32)
Calcium: 9.5 mg/dL (ref 8.9–10.3)
Chloride: 104 mmol/L (ref 98–111)
Creatinine, Ser: 0.75 mg/dL (ref 0.44–1.00)
GFR, Estimated: >60 mL/min (ref >60)
Glucose, Bld: 106 mg/dL — ABNORMAL HIGH (ref 70–99)
Potassium: 3.9 mmol/L (ref 3.5–5.1)
Sodium: 138 mmol/L (ref 135–145)

## 2024-11-19 LAB — GLUCOSE, CAPILLARY
Glucose-Capillary: 84 mg/dL (ref 70–99)
Glucose-Capillary: 169 mg/dL — ABNORMAL HIGH (ref 70–99)
Glucose-Capillary: 188 mg/dL — ABNORMAL HIGH (ref 70–99)
Glucose-Capillary: 252 mg/dL — ABNORMAL HIGH (ref 70–99)

## 2024-11-19 LAB — CBC
HCT: 37.4 % (ref 36.0–46.0)
Hemoglobin: 12.3 g/dL (ref 12.0–15.0)
MCH: 29.9 pg (ref 26.0–34.0)
MCHC: 32.9 g/dL (ref 30.0–36.0)
MCV: 91 fL (ref 80.0–100.0)
Platelets: 230 K/uL (ref 150–400)
RBC: 4.11 MIL/uL (ref 3.87–5.11)
RDW: 12.8 % (ref 11.5–15.5)
WBC: 6.2 K/uL (ref 4.0–10.5)
nRBC: 0 % (ref 0.0–0.2)

## 2024-11-19 LAB — HIV ANTIBODY (ROUTINE TESTING W REFLEX): HIV Screen 4th Generation wRfx: NONREACTIVE

## 2024-11-19 MED ORDER — LOSARTAN POTASSIUM 25 MG PO TABS
25.0000 mg | ORAL_TABLET | Freq: Every day | ORAL | Status: DC
  Administered 2024-11-19 – 2024-11-20 (×2): 25 mg via ORAL
  Filled 2024-11-19 (×2): qty 1

## 2024-11-19 MED ORDER — ASPIRIN 325 MG PO TBEC
325.0000 mg | DELAYED_RELEASE_TABLET | Freq: Every day | ORAL | Status: DC
  Administered 2024-11-20: 325 mg via ORAL
  Filled 2024-11-19: qty 1

## 2024-11-19 MED ORDER — INSULIN ASPART 100 UNIT/ML IJ SOLN
0.0000 [IU] | Freq: Three times a day (TID) | INTRAMUSCULAR | Status: DC
  Administered 2024-11-19 – 2024-11-20 (×2): 3 [IU] via SUBCUTANEOUS
  Filled 2024-11-19 (×2): qty 3

## 2024-11-19 MED ORDER — INSULIN ASPART 100 UNIT/ML IJ SOLN
0.0000 [IU] | Freq: Every day | INTRAMUSCULAR | Status: DC
  Administered 2024-11-19: 3 [IU] via SUBCUTANEOUS
  Filled 2024-11-19: qty 3

## 2024-11-19 NOTE — Assessment & Plan Note (Signed)
 Periprosthetic fracture Dr. Sherida of [facility] consulted Non-operative 50% WBAT PT/OT consulted Observing for pain control Family believes that she would benefit from 1 additional day prior to dc to home Inpatient case manager was able to obtain a walker and a bedside commode for free, as patient has no insurance

## 2024-11-19 NOTE — Plan of Care (Signed)

## 2024-11-19 NOTE — Progress Notes (Addendum)
   11/19/24 1200  TOC Brief Assessment  Insurance and Status Reviewed (No Insurance Coverage)  Patient has primary care physician No (No PCP)  Home environment has been reviewed From home  Prior level of function: Independent  Prior/Current Home Services No current home services  Social Drivers of Health Review SDOH reviewed no interventions necessary  Readmission risk has been reviewed Yes  Transition of care needs no transition of care needs at this time (Pt needed a walker, uninsured- ICM provided walker to room.)   Addendum:  CSW received an additional request for a BSC.  Pt uninsured, CSW reached out to [employer] regarding a possible charitable donation of a BSC, Jermaine agreed that he could assist and will deliver.

## 2024-11-19 NOTE — Hospital Course (Signed)
 68yo with h/o CVA with resultant dysphagia and dysgraphia, B THR, T2DM, and HTN who presented on 12/5 with a mechanical fall.  Xray with minimally displaced periprosthetic fracture with CT confirmation.  Orthopedics consulted, no operative intervention needed, 50% WBAT with rolling walker, PT/OT recommended.

## 2024-11-19 NOTE — Evaluation (Signed)
 Occupational Therapy Evaluation Patient Details Name: Cheryl Massey MRN: [id number] DOB: Dec 30, 1955 Today's Date: 11/19/2024   History of Present Illness   68 y.o. female with medical history significant for history of stroke with some residual speaking and writing difficulties, DMT2, R THA and hypertension who presents after a fall. Dx of R periprosthetic femur fracture, non operative plan.     Clinical Impressions PTA patient reports independent with mobility and ADLs.  Admitted for above and presents with problem list below.  She is in recliner and completes transfers/in room mobility with min assist using RW, cueing for step to technique due 50% WB.  She requires up to mod assist for ADLs, limited by pain in R hip.  Initiated discussion of using 3:1 for commode elevation and as shower chair.  Some baseline expressive difficulties, difficulty following multiple step commands and does not recall 50% WB from PT session earlier today. Anticipate she will progress well with no further OT needs after dc home, as she has good support from family.  Will benefit from acute OT to optimize independence and safety, adherence to precautions. Will follow.      If plan is discharge home, recommend the following:   A little help with walking and/or transfers;A lot of help with bathing/dressing/bathroom;Assistance with cooking/housework;Assist for transportation     Functional Status Assessment   Patient has had a recent decline in their functional status and demonstrates the ability to make significant improvements in function in a reasonable and predictable amount of time.     Equipment Recommendations   BSC/3in1;Other (comment) (RW)     Recommendations for Other Services         Precautions/Restrictions   Precautions Precautions: Fall Recall of Precautions/Restrictions: Intact Precaution/Restrictions Comments: no other falls in past 6 months Restrictions Weight  Bearing Restrictions Per Provider Order: Yes RLE Weight Bearing Per Provider Order: Partial weight bearing RLE Partial Weight Bearing Percentage or Pounds: 50% Other Position/Activity Restrictions: no active hip abduction     Mobility Bed Mobility               General bed mobility comments: OOB in recliner upon entry    Transfers Overall transfer level: Needs assistance Equipment used: Rolling walker (2 wheels) Transfers: Sit to/from Stand Sit to Stand: Min assist           General transfer comment: VCs hand placement      Balance Overall balance assessment: Needs assistance, History of Falls Sitting-balance support: Feet supported, No upper extremity supported Sitting balance-Leahy Scale: Good     Standing balance support: Bilateral upper extremity supported, During functional activity Standing balance-Leahy Scale: Fair Standing balance comment: BUE support on RW                           ADL either performed or assessed with clinical judgement   ADL Overall ADL's : Needs assistance/impaired     Grooming: Set up;Sitting           Upper Body Dressing : Set up;Sitting   Lower Body Dressing: Moderate assistance;Sit to/from stand Lower Body Dressing Details (indicate cue type and reason): for socks, min assist to stand Toilet Transfer: Minimal assistance;Ambulation;Rolling walker (2 wheels)   Toileting- Clothing Manipulation and Hygiene: Minimal assistance;Sit to/from stand       Functional mobility during ADLs: Minimal assistance;Rolling walker (2 wheels);Cueing for sequencing;Cueing for safety       Vision   Vision Assessment?:  No apparent visual deficits     Perception         Praxis         Pertinent Vitals/Pain Pain Assessment Pain Assessment: Faces Faces Pain Scale: Hurts even more Pain Location: R hip Pain Descriptors / Indicators: Sore, Grimacing, Guarding Pain Intervention(s): Limited activity within patient's  tolerance, Monitored during session, Repositioned     Extremity/Trunk Assessment Upper Extremity Assessment Upper Extremity Assessment: Overall WFL for tasks assessed   Lower Extremity Assessment Lower Extremity Assessment: Defer to PT evaluation RLE Deficits / Details: knee ext at least 3/5, did not do formal hip assessment 2* pain RLE: Unable to fully assess due to pain RLE Sensation: WNL   Cervical / Trunk Assessment Cervical / Trunk Assessment: Normal   Communication Communication Communication: Impaired Factors Affecting Communication: Difficulty expressing self (utilized ipad interpreter during session)   Cognition Arousal: Alert Behavior During Therapy: WFL for tasks assessed/performed Cognition: Difficult to assess Difficult to assess due to: Non-English speaking           OT - Cognition Comments: utilized interpreter, needing some repeated cues but cog vs language (reports hx of CVA with expressive difficulties). pt does not recall 50% PWB precautions from PT session.                 Following commands: Impaired Following commands impaired: Follows one step commands with increased time, Follows multi-step commands inconsistently     Cueing  General Comments   Cueing Techniques: Verbal cues;Tactile cues  family at side and supportive   Exercises     Shoulder Instructions      Home Living Family/patient expects to be discharged to:: Private residence Living Arrangements: Other relatives;Spouse/significant other;Children Available Help at Discharge: Family;Available 24 hours/day Type of Home: Apartment Home Access: Stairs to enter Entrance Stairs-Number of Steps: 1 Entrance Stairs-Rails: None Home Layout: One level     Bathroom Shower/Tub: Chief Strategy Officer: Standard     Home Equipment: None          Prior Functioning/Environment Prior Level of Function : Independent/Modified Independent             Mobility  Comments: walks without AD, no other falls in past 6 months ADLs Comments: independent    OT Problem List: Decreased strength;Decreased activity tolerance;Impaired balance (sitting and/or standing);Pain;Decreased knowledge of precautions;Decreased knowledge of use of DME or AE;Decreased cognition;Decreased safety awareness   OT Treatment/Interventions: Self-care/ADL training;Therapeutic exercise;DME and/or AE instruction;Therapeutic activities;Patient/family education;Balance training      OT Goals(Current goals can be found in the care plan section)   Acute Rehab OT Goals Patient Stated Goal: home OT Goal Formulation: With patient Time For Goal Achievement: 12/03/24 Potential to Achieve Goals: Good   OT Frequency:  Min 2X/week    Co-evaluation              AM-PAC OT 6 Clicks Daily Activity     Outcome Measure Help from another person eating meals?: None Help from another person taking care of personal grooming?: A Little Help from another person toileting, which includes using toliet, bedpan, or urinal?: A Little Help from another person bathing (including washing, rinsing, drying)?: A Lot Help from another person to put on and taking off regular upper body clothing?: A Little Help from another person to put on and taking off regular lower body clothing?: A Lot 6 Click Score: 17   End of Session Equipment Utilized During Treatment: Gait belt;Rolling walker (2 wheels) Nurse Communication:  Mobility status;Precautions  Activity Tolerance: Patient tolerated treatment well Patient left: in chair;with call bell/phone within reach;with nursing/sitter in room;with family/visitor present  OT Visit Diagnosis: Other abnormalities of gait and mobility (R26.89);Muscle weakness (generalized) (M62.81);Pain Pain - Right/Left: Right Pain - part of body: Hip                Time: 8756-8697 OT Time Calculation (min): 19 min Charges:  OT General Charges $OT Visit: 1 Visit OT  Evaluation $OT Eval Moderate Complexity: 1 Mod  Etta NOVAK, OT Acute Rehabilitation Services Office [phone] Secure Chat Preferred    Etta GORMAN Hope 11/19/2024, 1:56 PM

## 2024-11-19 NOTE — Assessment & Plan Note (Signed)
 Current BP is 147/63, range up to 158/87 Resume losartan 

## 2024-11-19 NOTE — Progress Notes (Signed)
 Progress Note   Patient: Cheryl Massey FMW:[id number] DOB: 1956/12/10 DOA: 11/18/2024     1 DOS: the patient was seen and examined on 11/19/2024   Brief hospital course: 68yo with h/o CVA with resultant dysphagia and dysgraphia, B THR, T2DM, and HTN who presented on 12/5 with a mechanical fall.  Xray with minimally displaced periprosthetic fracture with CT confirmation.  Orthopedics consulted, no operative intervention needed, 50% WBAT with rolling walker, PT/OT recommended.   Assessment & Plan Periprosthetic fracture of shaft of femur Periprosthetic fracture Dr. Sherida of [facility] consulted Non-operative 50% WBAT PT/OT consulted Observing for pain control Family believes that she would benefit from 1 additional day prior to dc to home Inpatient case manager was able to obtain a walker and a bedside commode for free, as patient has no insurance Type 2 diabetes mellitus treated without insulin  (HCC) A1c 9.1, poor control Hold Glucophage  Cover with moderate-scale SSI Carb modified diet  CVA (cerebrovascular accident) (HCC) Prior CVA with resultant aphasia Continue ASA 325 mg daily Essential hypertension Current BP is 147/63, range up to 158/87 Resume losartan       Consultants: Orthopedics PT OT  Procedures: None  Antibiotics: None  30 Day Unplanned Readmission Risk Score    Flowsheet Row ED to Hosp-Admission (Current) from 11/18/2024 in [facility]  30 Day Unplanned Readmission Risk Score (%) 5.2 Filed at 11/19/2024 0401    This score is the patient's risk of an unplanned readmission within 30 days of being discharged (0 -100%). The score is based on dignosis, age, lab data, medications, orders, and past utilization.   Low:  0-14.9   Medium: 15-21.9   High: 22-29.9   Extreme: 30 and above           Subjective: Significant pain of L femur to palpation and with movement.  Ambulated 16 feet with rolling walker with PT,  would benefit from an additional day of therapy per therapy (particularly since she has no insurance and so no access to home health).   Objective: Vitals:   11/19/24 0957 11/19/24 1304  BP: (!) 151/73 (!) 147/63  Pulse: (!) 57 (!) 59  Resp: 17 17  Temp: 98.8 F (37.1 C) 97.9 F (36.6 C)  SpO2: 98% 99%    Intake/Output Summary (Last 24 hours) at 11/19/2024 1453 Last data filed at 11/19/2024 1330 Gross per 24 hour  Intake 480 ml  Output 900 ml  Net -420 ml   Filed Weights   11/18/24 1713  Weight: 64.2 kg    Exam:  General:  Appears calm and comfortable and is in NAD while at rest Eyes:  normal lids, iris ENT:  grossly normal hearing, lips & tongue, mmm Cardiovascular:  RRR. No LE edema.  Respiratory:   CTA bilaterally with no wheezes/rales/rhonchi.  Normal respiratory effort. Abdomen:  soft, NT, ND Skin:  no rash or induration seen on limited exam Musculoskeletal:  grossly normal tone BUE/BLE, good ROM, no bony abnormality; R thigh is very tender to superficial palpation Psychiatric:  grossly normal mood and affect, speech fluent and appropriate, AOx3 Neurologic:  CN 2-12 grossly intact, moves all extremities in coordinated fashion  Data Reviewed: I have reviewed the patient's lab results since admission.  Pertinent labs for today include:   Unremarkable CMP Normal CBC    Family Communication: Granddaughter was present  Mobility: PT/OT Consulted and are recommending - No Pt Follow Up12/05/2024 1150    Code Status: Full Code    Disposition:  Status is: Inpatient Remains inpatient appropriate because: ongoing monitoring     Time spent: 50 minutes  Unresulted Labs (From admission, onward)    None        Author: Delon Herald, MD 11/19/2024 2:53 PM  For on call review [email or web address].

## 2024-11-19 NOTE — Assessment & Plan Note (Signed)
 Prior CVA with resultant aphasia Continue ASA 325 mg daily

## 2024-11-19 NOTE — Evaluation (Signed)
 Physical Therapy Evaluation Patient Details Name: Cheryl Massey MRN: [id number] DOB: 1956-07-19 Today's Date: 11/19/2024  History of Present Illness  68 y.o. female with medical history significant for history of stroke with some residual speaking and writing difficulties, DMT2, R THA and hypertension who presents after a fall. Dx of R periprosthetic femur fracture, non operative plan.  Clinical Impression  Pt admitted with above diagnosis. Ipad interpreter [phone] utilized during session. Noted pt had difficulty answering home setup and prior level of function questions. Per granddaughter pt has trouble with speech 2* a CVA, so granddaughter provided this information. Pt at baseline ambulates without an assistive device, is independent with ADLs, and hasn't had other falls in the past 6 months. Today pt had pain medication prior to PT session, then ambulation 16' with RW with good adherence to 50% PWB status, distance was limited by pain. Good progress expected.  Pt currently with functional limitations due to the deficits listed below (see PT Problem List). Pt will benefit from acute skilled PT to increase their independence and safety with mobility to allow discharge.           If plan is discharge home, recommend the following: A little help with walking and/or transfers;A little help with bathing/dressing/bathroom;Assistance with cooking/housework;Assist for transportation;Help with stairs or ramp for entrance   Can travel by private vehicle        Equipment Recommendations Rolling walker (2 wheels)  Recommendations for Other Services       Functional Status Assessment Patient has had a recent decline in their functional status and demonstrates the ability to make significant improvements in function in a reasonable and predictable amount of time.     Precautions / Restrictions Precautions Precautions: Fall Recall of Precautions/Restrictions:  Intact Precaution/Restrictions Comments: no other falls in past 6 months Restrictions Weight Bearing Restrictions Per Provider Order: Yes RLE Weight Bearing Per Provider Order: Partial weight bearing RLE Partial Weight Bearing Percentage or Pounds: 50%      Mobility  Bed Mobility Overal bed mobility: Needs Assistance Bed Mobility: Supine to Sit     Supine to sit: Mod assist     General bed mobility comments: assist to advance RLE, pivot hips, and raise trunk    Transfers Overall transfer level: Needs assistance Equipment used: Rolling walker (2 wheels) Transfers: Sit to/from Stand Sit to Stand: Min assist           General transfer comment: VCs hand placement    Ambulation/Gait Ambulation/Gait assistance: Contact guard assist Gait Distance (Feet): 16 Feet Assistive device: Rolling walker (2 wheels) Gait Pattern/deviations: Step-to pattern, Decreased step length - right, Decreased step length - left Gait velocity: decr     General Gait Details: steady, no loss of balance, distance limited by pain  Stairs            Wheelchair Mobility     Tilt Bed    Modified Rankin (Stroke Patients Only)       Balance Overall balance assessment: Needs assistance, History of Falls Sitting-balance support: Feet supported, No upper extremity supported Sitting balance-Leahy Scale: Good     Standing balance support: Bilateral upper extremity supported, During functional activity, Reliant on assistive device for balance Standing balance-Leahy Scale: Fair                               Pertinent Vitals/Pain Pain Assessment Pain Assessment: 0-10 Pain Score: 8  Pain Location:  R hip Pain Descriptors / Indicators: Sore, Grimacing, Guarding Pain Intervention(s): Limited activity within patient's tolerance, Monitored during session, Premedicated before session, Ice applied, Repositioned    Home Living                          Prior Function                        Extremity/Trunk Assessment   Upper Extremity Assessment Upper Extremity Assessment: Defer to OT evaluation    Lower Extremity Assessment Lower Extremity Assessment: RLE deficits/detail RLE Deficits / Details: knee ext at least 3/5, did not do formal hip assessment 2* pain RLE: Unable to fully assess due to pain RLE Sensation: WNL    Cervical / Trunk Assessment Cervical / Trunk Assessment: Normal  Communication   Communication Communication: Impaired Factors Affecting Communication: Difficulty expressing self;Non - English speaking, interpreter not available;Other (comment) (granddaughter provided PLOF and home info, she stated pt has trouble speaking 2* prior CVA)    Cognition Arousal: Alert     PT - Cognitive impairments: No apparent impairments                       PT - Cognition Comments: granddaughter provided PLOF and home info, she stated pt has trouble speaking 2* prior CVA; pt able to follow commands well Following commands: Intact       Cueing Cueing Techniques: Verbal cues, Tactile cues     General Comments      Exercises     Assessment/Plan    PT Assessment Patient needs continued PT services  PT Problem List Decreased strength;Decreased range of motion;Decreased activity tolerance;Decreased mobility;Decreased balance;Pain       PT Treatment Interventions Gait training;Therapeutic activities;Therapeutic exercise;Balance training;Patient/family education;Stair training    PT Goals (Current goals can be found in the Care Plan section)  Acute Rehab PT Goals Patient Stated Goal: to go home PT Goal Formulation: With patient/family Time For Goal Achievement: 12/03/24 Potential to Achieve Goals: Good    Frequency Min 3X/week     Co-evaluation               AM-PAC PT 6 Clicks Mobility  Outcome Measure Help needed turning from your back to your side while in a flat bed without using bedrails?: A  Little Help needed moving from lying on your back to sitting on the side of a flat bed without using bedrails?: A Little Help needed moving to and from a bed to a chair (including a wheelchair)?: A Little Help needed standing up from a chair using your arms (e.g., wheelchair or bedside chair)?: A Little Help needed to walk in hospital room?: A Little Help needed climbing 3-5 steps with a railing? : A Lot 6 Click Score: 17    End of Session Equipment Utilized During Treatment: Gait belt Activity Tolerance: Patient limited by pain;Patient tolerated treatment well Patient left: in chair;with call bell/phone within reach;with family/visitor present Nurse Communication: Mobility status PT Visit Diagnosis: Difficulty in walking, not elsewhere classified (R26.2);Pain;History of falling (Z91.81) Pain - Right/Left: Right Pain - part of body: Hip    Time: 8951-8888 PT Time Calculation (min) (ACUTE ONLY): 23 min   Charges:   PT Evaluation $PT Eval Moderate Complexity: 1 Mod PT Treatments $Gait Training: 8-22 mins PT General Charges $$ ACUTE PT VISIT: 1 Visit        Sylvan Nest Kistler PT 11/19/2024  Acute Rehabilitation Services  Office [phone]

## 2024-11-19 NOTE — Plan of Care (Signed)
  Problem: Coping: Goal: Level of anxiety will decrease Outcome: Progressing   Problem: Safety: Goal: Ability to remain free from injury will improve Outcome: Progressing   Problem: Pain Managment: Goal: General experience of comfort will improve and/or be controlled Outcome: Progressing

## 2024-11-19 NOTE — Assessment & Plan Note (Deleted)
 Periprosthetic fracture Dr. Sherida of [facility] consulted Non-operative 50% WBAT PT/OT consulted Admitted for pain control

## 2024-11-19 NOTE — Assessment & Plan Note (Signed)
 A1c 9.1, poor control Hold Glucophage  Cover with moderate-scale SSI Carb modified diet

## 2024-11-20 ENCOUNTER — Other Ambulatory Visit (HOSPITAL_COMMUNITY): Payer: Self-pay

## 2024-11-20 LAB — GLUCOSE, CAPILLARY
Glucose-Capillary: 167 mg/dL — ABNORMAL HIGH (ref 70–99)
Glucose-Capillary: 216 mg/dL — ABNORMAL HIGH (ref 70–99)

## 2024-11-20 MED ORDER — SITAGLIPTIN PHOSPHATE 100 MG PO TABS
100.0000 mg | ORAL_TABLET | Freq: Every day | ORAL | 0 refills | Status: DC
  Filled 2024-11-20: qty 30, 30d supply, fill #0

## 2024-11-20 MED ORDER — OXYCODONE HCL 5 MG PO TABS
5.0000 mg | ORAL_TABLET | ORAL | 0 refills | Status: AC | PRN
  Filled 2024-11-20: qty 20, 4d supply, fill #0

## 2024-11-20 MED ORDER — ACETAMINOPHEN 325 MG PO TABS: 650.0000 mg | ORAL_TABLET | Freq: Four times a day (QID) | ORAL | Status: DC | PRN

## 2024-11-20 MED ORDER — ACETAMINOPHEN 325 MG PO TABS
650.0000 mg | ORAL_TABLET | Freq: Four times a day (QID) | ORAL | 0 refills | Status: AC | PRN
  Filled 2024-11-20: qty 30, 4d supply, fill #0

## 2024-11-20 MED ORDER — METHOCARBAMOL 500 MG PO TABS
500.0000 mg | ORAL_TABLET | Freq: Four times a day (QID) | ORAL | 0 refills | Status: AC | PRN
  Filled 2024-11-20: qty 30, 8d supply, fill #0

## 2024-11-20 MED ORDER — GLIPIZIDE 5 MG PO TABS
5.0000 mg | ORAL_TABLET | Freq: Every day | ORAL | 0 refills | Status: DC
  Filled 2024-11-20: qty 30, 30d supply, fill #0

## 2024-11-20 NOTE — Assessment & Plan Note (Addendum)
 A1c 9.1, poor control Resume Glucophage  Previously prescribed Januvia  and she appears to have never picked it up; apparently, it was cost-prohibitive Will add glipizide  with plan for short-term PCP f/u Carb modified diet

## 2024-11-20 NOTE — Progress Notes (Signed)
 Occupational Therapy Treatment Patient Details Name: Cheryl Massey MRN: [id number] DOB: 07-15-1956 Today's Date: 11/20/2024   History of present illness 68 y.o. female with medical history significant for history of stroke with some residual speaking and writing difficulties, DMT2, R THA and hypertension who presents after a fall. Dx of R periprosthetic femur fracture, non operative plan.   OT comments  Pt sitting in recliner, daughter present during session, interpreter utilized. Pt with difficulty expressing self, grand daughter assisted with session. Pt able to fully participate and follow commands well, answer yes/no appropriately. Pt transferred and ambulated with CGA using RW for support, able to ambulate 30 feet. Pt will need mod A for LB ADLs. Pt educated on precautions, able to maintain throughout session. Educated on safety precautions for return home as she plans for no HH follow up, Pt and granddaughter verbalized understanding. Pt has consistent help at home. Will continue to see Pt acutely to progress as able.       If plan is discharge home, recommend the following:  A little help with walking and/or transfers;A lot of help with bathing/dressing/bathroom;Assistance with cooking/housework;Assist for transportation   Equipment Recommendations  Other (comment) (RW)    Recommendations for Other Services      Precautions / Restrictions Precautions Precautions: Fall Recall of Precautions/Restrictions: Intact Precaution/Restrictions Comments: no other falls in past 6 months Restrictions Weight Bearing Restrictions Per Provider Order: Yes RLE Weight Bearing Per Provider Order: Partial weight bearing RLE Partial Weight Bearing Percentage or Pounds: 50%       Mobility Bed Mobility               General bed mobility comments: in recliner    Transfers Overall transfer level: Needs assistance Equipment used: Rolling walker (2 wheels) Transfers: Sit  to/from Stand Sit to Stand: Contact guard assist           General transfer comment: CGA for mobiltiy with RW     Balance Overall balance assessment: Needs assistance Sitting-balance support: No upper extremity supported, Feet supported Sitting balance-Leahy Scale: Good     Standing balance support: Bilateral upper extremity supported, During functional activity Standing balance-Leahy Scale: Fair Standing balance comment: reliant on RW for support                           ADL either performed or assessed with clinical judgement   ADL Overall ADL's : Needs assistance/impaired Eating/Feeding: Independent   Grooming: Set up;Sitting           Upper Body Dressing : Set up;Sitting   Lower Body Dressing: Moderate assistance;Sit to/from stand   Toilet Transfer: Contact guard assist;Rolling walker (2 wheels)   Toileting- Clothing Manipulation and Hygiene: Contact guard assist;Sitting/lateral lean;Sit to/from stand       Functional mobility during ADLs: Contact guard assist;Rolling walker (2 wheels) General ADL Comments: CGA for transfers/mobiltiy with RW, mod A for LB ADLs due to R leg pain. Good BUE strength/ROM for UB ADLs    Extremity/Trunk Assessment              Vision       Perception     Praxis     Communication Communication Communication: Impaired Factors Affecting Communication: Difficulty expressing self   Cognition Arousal: Alert Behavior During Therapy: WFL for tasks assessed/performed Cognition: Difficult to assess Difficult to assess due to: Non-English speaking           OT - Cognition  Comments: utilized interpreter, needing some repeated cues but cog vs language (reports hx of CVA with expressive difficulties). pt does not recall 50% PWB precautions from PT session.                 Following commands: Impaired Following commands impaired: Follows one step commands with increased time, Follows multi-step commands  inconsistently      Cueing   Cueing Techniques: Verbal cues, Tactile cues  Exercises      Shoulder Instructions       General Comments      Pertinent Vitals/ Pain          Home Living                                          Prior Functioning/Environment              Frequency  Min 2X/week        Progress Toward Goals  OT Goals(current goals can now be found in the care plan section)  Progress towards OT goals: Progressing toward goals  Acute Rehab OT Goals Patient Stated Goal: to return home OT Goal Formulation: With patient Time For Goal Achievement: 12/03/24 Potential to Achieve Goals: Good ADL Goals Pt Will Perform Grooming: with modified independence;standing Pt Will Perform Lower Body Dressing: with modified independence;sit to/from stand;with adaptive equipment Pt Will Transfer to Toilet: with modified independence;ambulating;bedside commode Pt Will Perform Toileting - Clothing Manipulation and hygiene: with modified independence;sitting/lateral leans;sit to/from stand Pt Will Perform Tub/Shower Transfer: Tub transfer;with supervision;3 in 1;ambulating;rolling walker  Plan      Co-evaluation                 AM-PAC OT 6 Clicks Daily Activity     Outcome Measure   Help from another person eating meals?: None Help from another person taking care of personal grooming?: A Little Help from another person toileting, which includes using toliet, bedpan, or urinal?: A Little Help from another person bathing (including washing, rinsing, drying)?: A Lot Help from another person to put on and taking off regular upper body clothing?: A Little Help from another person to put on and taking off regular lower body clothing?: A Lot 6 Click Score: 17    End of Session Equipment Utilized During Treatment: Gait belt;Rolling walker (2 wheels)  OT Visit Diagnosis: Other abnormalities of gait and mobility (R26.89);Muscle weakness  (generalized) (M62.81);Pain Pain - Right/Left: Right Pain - part of body: Hip   Activity Tolerance Patient tolerated treatment well   Patient Left in chair;with call bell/phone within reach;with family/visitor present   Nurse Communication Mobility status        Time: 8994-8977 OT Time Calculation (min): 17 min  Charges: OT General Charges $OT Visit: 1 Visit OT Treatments $Self Care/Home Management : 8-22 mins  [place], OTR/L   Elouise JONELLE Bott 11/20/2024, 11:02 AM

## 2024-11-20 NOTE — Discharge Summary (Addendum)
 Physician Discharge Summary   Patient: Cheryl Massey MRN: [id number] DOB: 02-09-1956  Admit date:     11/18/2024  Discharge date: 11/20/24  Discharge Physician: Delon Herald   PCP: Pcp, No   Recommendations at discharge:   You are being discharged with a walker and bedside commode 50% weight bearing as tolerated with rolling walker You are being prescribed a limited number of narcotic pain pills and muscle relaxers; take as directed and do not drive or make important decisions while taking this medication  Poor diabetes control; add glipizide  once daily and follow up with PCP Follow up with Dr. Sherida from orthopedics in 2 weeks Follow up with primary care physician; call for an appointment  Discharge Diagnoses: Principal Problem:   Periprosthetic fracture of shaft of femur Active Problems:   Essential hypertension   Type 2 diabetes mellitus treated without insulin  (HCC)   CVA (cerebrovascular accident) [facility])    Hospital Course: 68yo with h/o CVA with resultant dysphagia and dysgraphia, B THR, T2DM, and HTN who presented on 12/5 with a mechanical fall.  Xray with minimally displaced periprosthetic fracture with CT confirmation.  Orthopedics consulted, no operative intervention needed, 50% WBAT with rolling walker, PT/OT recommended.  Assessment and Plan:  Assessment & Plan Periprosthetic fracture of shaft of femur Periprosthetic fracture Dr. Sherida of [facility] consulted Non-operative 50% WBAT PT/OT consulted Observing for pain control Inpatient case manager was able to obtain a walker and a bedside commode for free, as patient has no insurance Follow up with orthopedics in 2 weeks Type 2 diabetes mellitus treated without insulin  (HCC) A1c 9.1, poor control Resume Glucophage  Previously prescribed Januvia  and she appears to have never picked it up; apparently, it was cost-prohibitive Will add glipizide  with plan for short-term PCP f/u Carb  modified diet  CVA (cerebrovascular accident) (HCC) Prior CVA with resultant aphasia Continue ASA 325 mg daily Essential hypertension Current BP is 143/84 Resume losartan  Follow up with PCP, as additional medication may be needed     Consultants: Orthopedics PT OT   Procedures: None   Antibiotics: None  Pain control - [place]  Controlled Substance Reporting System database was reviewed. and patient was instructed, not to drive, operate heavy machinery, perform activities at heights, swimming or participation in water  activities or provide baby-sitting services while on Pain, Sleep and Anxiety Medications; until their outpatient Physician has advised to do so again. Also recommended to not to take more than prescribed Pain, Sleep and Anxiety Medications.   Disposition: Home Diet recommendation:  Carb modified diet DISCHARGE MEDICATION: Allergies as of 11/20/2024   No Known Allergies      Medication List     STOP taking these medications    sitaGLIPtin  100 MG tablet Commonly known as: JANUVIA        TAKE these medications    acetaminophen  325 MG tablet Commonly known as: TYLENOL  Take 2 tablets (650 mg total) by mouth every 6 (six) hours as needed for mild pain (pain score 1-3) or headache.   aspirin  EC 325 MG tablet Take 325 mg by mouth daily with breakfast.   glipiZIDE  5 MG tablet Commonly known as: GLUCOTROL  Take 1 tablet (5 mg total) by mouth daily before breakfast.   losartan  25 MG tablet Commonly known as: COZAAR  Take 25 mg by mouth daily.   metFORMIN  1000 MG tablet Commonly known as: GLUCOPHAGE  Take 1 tablet (1,000 mg total) by mouth 2 (two) times daily with a meal.   methocarbamol  500 MG tablet  Commonly known as: ROBAXIN  Take 1 tablet (500 mg total) by mouth every 6 (six) hours as needed for muscle spasms.   oxyCODONE  5 MG immediate release tablet Commonly known as: Oxy IR/ROXICODONE  Take 1 tablet (5 mg total) by mouth every 4 (four)  hours as needed for severe pain (pain score 7-10).        Follow-up Information     Sherida Adine BROCKS, MD. Schedule an appointment as soon as possible for a visit in 2 week(s).   Specialty: Orthopedic Surgery Contact information: [address] [place] [address] [phone]         Secure a Primary care doctor. Schedule an appointment as soon as possible for a visit.   Why: Please call clinic to schedule an appointment. Contact information: [facility] [address] [name] [place], [place] [address]  [phone]               Discharge Exam:    Subjective: Feeling some better.  Pain is controlled at rest and she was able to ambulate some yesterday.  Feels ready for dc.   Objective: Vitals:   11/19/24 2159 11/20/24 0554  BP: 138/81 (!) 143/84  Pulse: 74 62  Resp: 15 12  Temp: 97.8 F (36.6 C) 97.8 F (36.6 C)  SpO2: 97% 97%    Intake/Output Summary (Last 24 hours) at 11/20/2024 1159 Last data filed at 11/20/2024 0100 Gross per 24 hour  Intake 600 ml  Output --  Net 600 ml   Filed Weights   11/18/24 1713  Weight: 64.2 kg    Exam:  General:  Appears calm and comfortable and is in NAD Eyes:  normal lids, iris ENT:  grossly normal hearing, lips & tongue, mmm Cardiovascular:  RRR. No LE edema.  Respiratory:   CTA bilaterally with no wheezes/rales/rhonchi.  Normal respiratory effort. Abdomen:  soft, NT, ND Skin:  no rash or induration seen on limited exam Musculoskeletal:  R femur TTP but has some RLE mobility Psychiatric:  grossly normal mood and affect, speech fluent and appropriate, AOx3 Neurologic:  CN 2-12 grossly intact, moves all extremities in coordinated fashion other than limited RLE movement  Data Reviewed: I have reviewed the patient's lab results since admission.  Pertinent labs for today include:   None today    Condition at discharge: stable  The results of significant diagnostics from this  hospitalization (including imaging, microbiology, ancillary and laboratory) are listed below for reference.   Imaging Studies: CT Hip Right Wo Contrast Result Date: 11/18/2024 EXAM: CT OF THE RIGHT HIP WITHOUT IV CONTRAST 11/18/2024 04:25:16 PM TECHNIQUE: CT of the right hip was performed without the administration of intravenous contrast. Multiplanar reformatted images are provided for review. Automated exposure control, iterative reconstruction, and/or weight based adjustment of the mA/kV was utilized to reduce the radiation dose to as low as reasonably achievable. COMPARISON: Prior radiographs. Prior exams back through 10/05/2020. CLINICAL HISTORY: Right hip periprosthetic fracture. FINDINGS: BONES: Right total hip prosthesis is observed. Periprosthetic fracture involving primarily the greater trochanter extending down to the lateral subtrochanteric region as on image 62 series 6, with up to about 6 mm of displacement anteriorly although less posteriorly likely due to mild rotation of the greater trochanteric fragment. In conjunction with the prior radiographs, there is about 13.5 cm of stem seated within the femoral shaft below the most distal level of the periprosthetic fracture. Below the level of the fracture we do not demonstrate periprosthetic lucency. The very  bottom tip of the stem component was inadvertently not included, but is depicted on radiographs without abnormality around the stem tip. The patient also has a contralateral total hip prosthesis visible on the scout image. SOFT TISSUE: No significant soft tissue edema or fluid collections. No soft tissue mass. JOINT: Right total hip prosthesis is observed. 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 A small degenerative subcortical cystic lesion is present in the right acetabulum just above the acetabular shell component as  on image 62 series 6, probably a preexisting geode and less likely to be due to early particulate disease. INTRAPELVIC CONTENTS: Limited images of the intrapelvic contents are unremarkable. IMPRESSION: 1. Right periprosthetic fracture involving primarily the greater trochanter extending to the lateral subtrochanteric region, with up to approximately 6 mm anterior displacement and less posteriorly, likely due to mild rotation of the greater trochanteric fragment. 2. Small degenerative subcortical cystic lesion in the right acetabulum above the acetabular shell component, likely a preexisting geode. 3. 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 Electronically signed by: Ryan Salvage MD 11/18/2024 04:36 PM EST RP Workstation: [id number]   DG Knee Complete 4 Views Right Result Date: 11/18/2024 EXAM: 4 OR MORE VIEW(S) XRAY OF THE RIGHT KNEE 11/18/2024 01:21:08 PM COMPARISON: None available. CLINICAL HISTORY: fall FINDINGS: BONES AND JOINTS: No acute fracture. No malalignment. No significant joint effusion. Moderate narrowing of medial joint space is noted. Mild osteophyte formation is noted laterally. Mild narrowing of the patellofemoral space is noted. SOFT TISSUES: The soft tissues are unremarkable. IMPRESSION: 1. Moderate narrowing of the medial joint space. 2. Mild lateral osteophyte formation. 3. Mild narrowing of the patellofemoral space. Electronically signed by: Lynwood Seip MD 11/18/2024 01:40 PM EST RP Workstation: [id number]   DG Hip Unilat With Pelvis 2-3 Views Right Result Date: 11/18/2024 EXAM: 2 or 3 VIEW(S) XRAY OF THE RIGHT HIP 11/18/2024 01:21:08 PM COMPARISON: 03/11/2022 CLINICAL HISTORY: fall FINDINGS: BONES AND JOINTS: Status post bilateral total hip arthroplasty. Minimally displaced periprosthetic fracture is seen involving the right proximal femur. No malalignment. SOFT TISSUES: The  soft tissues are unremarkable. IMPRESSION: 1. Minimally displaced periprosthetic fracture of the right proximal femur in the setting of bilateral total hip arthroplasty. Electronically signed by: Lynwood Seip MD 11/18/2024 01:37 PM EST RP Workstation: [id number]    Microbiology: Results for orders placed or performed during the hospital encounter of 03/28/22  Group A Strep by PCR     Status: None   Collection Time: 03/29/22  4:25 AM   Specimen: Throat; Sterile Swab  Result Value Ref Range Status   Group A Strep by PCR NOT DETECTED NOT DETECTED Final    Comment: Performed at [facility], [address] [place], [place] [address]    Labs: CBC: Recent Labs  Lab 11/18/24 1221 11/19/24 0330  WBC 7.9 6.2  NEUTROABS 5.2  --   HGB 13.5 12.3  HCT 40.7 37.4  MCV 90.2 91.0  PLT 270 230   Basic Metabolic Panel: Recent Labs  Lab 11/18/24 1221 11/19/24 0330  NA 141 138  K 3.9 3.9  CL 104 104  CO2 24 23  GLUCOSE 114* 106*  BUN 17 17  CREATININE 0.87 0.75  CALCIUM  10.0 9.5   Liver Function Tests: No results for input(s): AST, ALT, ALKPHOS, BILITOT, PROT, ALBUMIN in the last 168  hours. CBG: Recent Labs  Lab 11/19/24 1146 11/19/24 1635 11/19/24 2158 11/20/24 0737 11/20/24 1123  GLUCAP 169* 188* 252* 167* 216*    Discharge time spent: greater than 30 minutes.  Signed: Delon Herald, MD [facility] 11/20/2024

## 2024-11-20 NOTE — Assessment & Plan Note (Addendum)
 Periprosthetic fracture Dr. Sherida of [facility] consulted Non-operative 50% WBAT PT/OT consulted Observing for pain control Inpatient case manager was able to obtain a walker and a bedside commode for free, as patient has no insurance Follow up with orthopedics in 2 weeks

## 2024-11-20 NOTE — Assessment & Plan Note (Signed)
 Prior CVA with resultant aphasia Continue ASA 325 mg daily

## 2024-11-20 NOTE — Assessment & Plan Note (Addendum)
 Current BP is 143/84 Resume losartan  Follow up with PCP, as additional medication may be needed

## 2024-11-20 NOTE — Progress Notes (Signed)
 Physical Therapy Treatment Patient Details Name: Cheryl Massey MRN: [id number] DOB: 10-10-1956 Today's Date: 11/20/2024   History of Present Illness 68 y.o. female with medical history significant for history of stroke with some residual speaking and writing difficulties, DMT2, R THA and hypertension who presents after a fall. Dx of R periprosthetic femur fracture, non operative plan.    PT Comments  Pt progressing well, reviewed mobility as below. Grand-dtr present for session.     If plan is discharge home, recommend the following: A little help with walking and/or transfers;A little help with bathing/dressing/bathroom;Assistance with cooking/housework;Assist for transportation;Help with stairs or ramp for entrance   Can travel by private vehicle        Equipment Recommendations  Rolling walker (2 wheels)    Recommendations for Other Services       Precautions / Restrictions Precautions Precautions: Fall Recall of Precautions/Restrictions: Intact Precaution/Restrictions Comments: no other falls in past 6 months Restrictions RLE Weight Bearing Per Provider Order: Partial weight bearing RLE Partial Weight Bearing Percentage or Pounds: 50% Other Position/Activity Restrictions: no active hip abduction     Mobility  Bed Mobility               General bed mobility comments: in recliner    Transfers Overall transfer level: Needs assistance Equipment used: Rolling walker (2 wheels) Transfers: Sit to/from Stand Sit to Stand: Supervision           General transfer comment: pt demonstrates carryover for proper hand placement  and RLE Position    Ambulation/Gait Ambulation/Gait assistance: Supervision, Contact guard assist Gait Distance (Feet): 30 Feet Assistive device: Rolling walker (2 wheels) Gait Pattern/deviations: Step-to pattern Gait velocity: decr     General Gait Details: cues for sequence and adherence to PWB   Stairs Stairs:  Yes Stairs assistance: Contact guard assist, Min assist Stair Management: No rails, Step to pattern, Backwards, With walker Number of Stairs: 1 General stair comments: cues for sequence and technique. staff assisting with  translation. grand-dtr present. good stability, assist to manage RW   Wheelchair Mobility     Tilt Bed    Modified Rankin (Stroke Patients Only)       Balance   Sitting-balance support: No upper extremity supported, Feet supported       Standing balance support: Bilateral upper extremity supported, During functional activity Standing balance-Leahy Scale: Poor Standing balance comment: reliant on RW for support                            Communication Communication Communication: Impaired Factors Affecting Communication: Difficulty expressing self;Non - English speaking, interpreter not available  Cognition Arousal: Alert Behavior During Therapy: WFL for tasks assessed/performed   PT - Cognitive impairments: No apparent impairments                         Following commands: Intact      Cueing Cueing Techniques: Verbal cues  Exercises      General Comments        Pertinent Vitals/Pain Pain Assessment Pain Assessment: Faces Faces Pain Scale: Hurts little more Pain Location: R hip Pain Descriptors / Indicators: Sore, Grimacing, Guarding Pain Intervention(s): Limited activity within patient's tolerance, Monitored during session, Premedicated before session, Repositioned    Home Living  Prior Function            PT Goals (current goals can now be found in the care plan section) Acute Rehab PT Goals PT Goal Formulation: With patient/family Time For Goal Achievement: 12/03/24 Potential to Achieve Goals: Good Progress towards PT goals: Progressing toward goals    Frequency    Min 3X/week      PT Plan      Co-evaluation              AM-PAC PT 6 Clicks Mobility    Outcome Measure  Help needed turning from your back to your side while in a flat bed without using bedrails?: A Little Help needed moving from lying on your back to sitting on the side of a flat bed without using bedrails?: A Little Help needed moving to and from a bed to a chair (including a wheelchair)?: A Little Help needed standing up from a chair using your arms (e.g., wheelchair or bedside chair)?: A Little Help needed to walk in hospital room?: A Little Help needed climbing 3-5 steps with a railing? : A Little 6 Click Score: 18    End of Session Equipment Utilized During Treatment: Gait belt Activity Tolerance: Patient tolerated treatment well;Patient limited by pain;Patient limited by fatigue Patient left: in chair;with call bell/phone within reach;with family/visitor present Nurse Communication: Mobility status PT Visit Diagnosis: Difficulty in walking, not elsewhere classified (R26.2);Pain;History of falling (Z91.81) Pain - Right/Left: Right Pain - part of body: Hip     Time: 8948-8884 PT Time Calculation (min) (ACUTE ONLY): 24 min  Charges:    $Gait Training: 23-37 mins PT General Charges $$ ACUTE PT VISIT: 1 Visit                     Mylinh Cragg, PT  Acute Rehab Dept [facility]) [phone]  11/20/2024    [facility] 11/20/2024, 11:21 AM

## 2024-11-20 NOTE — TOC Transition Note (Signed)
 Transition of Care [facility]) - Discharge Note   Patient Details  Name: Cheryl Massey MRN: [id number] Date of Birth: [birth year]  Transition of Care [facility]) CM/SW Contact:  Lorraine LILLETTE Fenton, LCSW Phone Number: 11/20/2024, 2:07 PM   Clinical Narrative:    CSW visited pt and granddaughter in room, patient in chair, through translation- discussed importance of PCP for managing care. 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 Advised self pay needed and provided [facility] clinic information requesting a follow up appointment be scheduled this week.   CSW did not see the [facility] in the room that [employer] agreed to donate and reached out to  Jermaine at [place].   He agreed to deliver today- pt close to DC and agreed for a home delivery. Shared appreciation with Marcellus, family was very appreciative as well. No further ICM needs.     Barriers to Discharge: No Barriers Identified   Patient Goals and CMS Choice            Discharge Placement                       Discharge Plan and Services Additional resources added to the After Visit Summary for                                       Social Drivers of Health (SDOH) Interventions SDOH Screenings   Food Insecurity: No Food Insecurity (11/18/2024)  Housing: Low Risk  (11/18/2024)  Transportation Needs: No Transportation Needs (11/18/2024)  Utilities: Not At Risk (11/18/2024)  Depression (PHQ2-9): Low Risk  (08/05/2022)  Social Connections: Socially Integrated (11/18/2024)  Tobacco Use: Low Risk  (11/18/2024)     Readmission Risk Interventions     No data to display

## 2024-11-22 ENCOUNTER — Other Ambulatory Visit (HOSPITAL_COMMUNITY): Payer: Self-pay

## 2024-11-23 ENCOUNTER — Other Ambulatory Visit (HOSPITAL_COMMUNITY): Payer: Self-pay

## 2024-11-23 ENCOUNTER — Ambulatory Visit: Payer: Self-pay | Admitting: Nurse Practitioner

## 2024-11-23 ENCOUNTER — Encounter: Payer: Self-pay | Admitting: Nurse Practitioner

## 2024-11-23 VITALS — BP 132/68 | HR 63 | Resp 16 | Wt 141.0 lb

## 2024-11-23 DIAGNOSIS — E119 Type 2 diabetes mellitus without complications: Secondary | ICD-10-CM

## 2024-11-23 DIAGNOSIS — Z96649 Presence of unspecified artificial hip joint: Secondary | ICD-10-CM

## 2024-11-23 DIAGNOSIS — M978XXA Periprosthetic fracture around other internal prosthetic joint, initial encounter: Secondary | ICD-10-CM

## 2024-11-23 DIAGNOSIS — Z8673 Personal history of transient ischemic attack (TIA), and cerebral infarction without residual deficits: Secondary | ICD-10-CM | POA: Insufficient documentation

## 2024-11-23 DIAGNOSIS — I10 Essential (primary) hypertension: Secondary | ICD-10-CM

## 2024-11-23 DIAGNOSIS — Z09 Encounter for follow-up examination after completed treatment for conditions other than malignant neoplasm: Secondary | ICD-10-CM | POA: Insufficient documentation

## 2024-11-23 MED ORDER — GLIPIZIDE 5 MG PO TABS
5.0000 mg | ORAL_TABLET | Freq: Every day | ORAL | 1 refills | Status: AC
  Filled 2024-11-23: qty 90, 90d supply, fill #0

## 2024-11-23 MED ORDER — METFORMIN HCL 1000 MG PO TABS: 1000.0000 mg | ORAL_TABLET | Freq: Two times a day (BID) | ORAL | 3 refills | Status: DC

## 2024-11-23 MED ORDER — LOSARTAN POTASSIUM 25 MG PO TABS
25.0000 mg | ORAL_TABLET | Freq: Every day | ORAL | 1 refills | Status: AC
  Filled 2024-11-23: qty 90, 90d supply, fill #0

## 2024-11-23 MED ORDER — LOSARTAN POTASSIUM 25 MG PO TABS: 25.0000 mg | ORAL_TABLET | Freq: Every day | ORAL | 1 refills | Status: DC

## 2024-11-23 MED ORDER — METFORMIN HCL 1000 MG PO TABS
1000.0000 mg | ORAL_TABLET | Freq: Two times a day (BID) | ORAL | 3 refills | Status: AC
  Filled 2024-11-23: qty 180, 90d supply, fill #0

## 2024-11-23 MED ORDER — GLIPIZIDE 5 MG PO TABS: 5.0000 mg | ORAL_TABLET | Freq: Every day | ORAL | 1 refills | Status: DC

## 2024-11-23 NOTE — Assessment & Plan Note (Signed)
 BP Readings from Last 3 Encounters:  11/23/24 132/68  11/20/24 (!) 143/84  08/05/22 (!) 149/80   Managed with losartan . Dietary changes emphasized for blood pressure control. - Continue losartan  25 mg daily. - Advised heart-healthy diet low in salt and fat.

## 2024-11-23 NOTE — Assessment & Plan Note (Signed)
 History of cerebrovascular accident No current cholesterol medication. Plan to assess cholesterol for cardiovascular risk. - Checked direct LDL - Continue aspirin  81 mg daily.

## 2024-11-23 NOTE — Assessment & Plan Note (Signed)
 Right hip periprosthetic fracture Minimally displaced fracture confirmed by CT. No surgery needed. - Continue 50% weight bearing with rolling walker. - Follow up with orthopedics in two weeks. Continue oxycodone  5 mg every 4 hours as needed, Robaxin  500 mg every 6 hours as needed, Tylenol  650 mg every 6 hours as needed pain PT OT was ordered at discharge Fall prevention education completed

## 2024-11-23 NOTE — Patient Instructions (Addendum)
 El objetivo para la glucemia en ayunas oscila entre 80 y 120, y el nivel de glucemia debe estar entre 130 y 170 2 horas despus de cualquier comida o al [employer]  1. Diabetes mellitus tipo 2 tratada sin insulina (CHC) (Primaria)  - Metformina (Glucfago) 1000 mg comprimido; Tomar 1 comprimido (1000 mg en total) por va oral 2 (dos) veces al da con una comida. Dispensacin: 180 comprimidos; Resurtido: 3 - Glipizida (Glucotrol ) 5 mg comprimido; Tomar 1 comprimido (5 mg en total) por va oral diariamente antes del desayuno. Dispensacin: 90 comprimidos; Resurtido: 1  2. Hipertensin esencial  - Losartn (Cozaar ) 25 mg comprimido; Tomar 1 comprimido (25 mg en total) por va oral diariamente. Dispensacin: 90 comprimidos; Resurtido: 1  3. Antecedentes de accidente cerebrovascular (ACV)   Es importante que haga ejercicio regularmente, al menos 30 minutos 5 veces por semana, segn su tolerancia. Piense en lo que va a comer y planifique con anticipacin. Elija alimentos limpios, verdes, frescos o congelados en lugar de alimentos enlatados, procesados o envasados, que son ms azucarados, salados y scientist, physiological. Entre el 70 y el 75 % de los alimentos consumidos deben ser verduras y frutas. Tres comidas a horarios fijos, con refrigerios permitidos entre comidas, pero deben ser frutas o verduras. Intente comer durante un perodo de 12 horas, por ejemplo, de 7 a. m. a 7 p. m., y DETNGASE despus de su ltima comida del da. Cheryl Massey, generalmente alrededor de 64 onzas al da; ninguna otra bebida es tan saludable. El jugo de fruta se disfruta mejor de forma saludable, COMIENDO la fruta.  Cheryl Massey eleg[facility]; consideramos un privilegio servirle.   Goal for fasting blood sugar ranges from 80 to 120 and 2 hours after any meal or at bedtime should be between 130 to 170.    1. Type 2 diabetes mellitus treated without insulin  (HCC) (Primary)   - metFORMIN  (GLUCOPHAGE ) 1000 MG tablet;  Take 1 tablet (1,000 mg total) by mouth 2 (two) times daily with a meal.  Dispense: 180 tablet; Refill: 3 - glipiZIDE  (GLUCOTROL ) 5 MG tablet; Take 1 tablet (5 mg total) by mouth daily before breakfast.  Dispense: 90 tablet; Refill: 1   2. Essential hypertension  - losartan  (COZAAR ) 25 MG tablet; Take 1 tablet (25 mg total) by mouth daily.  Dispense: 90 tablet; Refill: 1  3. History of CVA (cerebrovascular accident)  It is important that you exercise regularly at least 30 minutes 5 times a week as tolerated  Think about what you will eat, plan ahead. Choose  clean, green, fresh or frozen over canned, processed or packaged foods which are more sugary, salty and fatty. 70 to 75% of food eaten should be vegetables and fruit. Three meals at set times with snacks allowed between meals, but they must be fruit or vegetables. Aim to eat over a 12 hour period , example 7 am to 7 pm, and STOP after  your last meal of the day. Drink water ,generally about 64 ounces per day, no other drink is as healthy. Fruit juice is best enjoyed in a healthy way, by EATING the fruit.  Thanks for choosing [facility] we consider it a privelige to serve you.

## 2024-11-23 NOTE — Assessment & Plan Note (Addendum)
°  A1c of 9.1 indicates poor control. On metformin  and newly started glipizide . Dietary changes emphasized. - Continue metformin  1000 mg twice daily. - Continue glipizide  5 mg daily. - Advised low carbohydrate diet, avoiding sweets, soda, bread, rice, and pasta. - Monitor blood glucose levels regularly and record readings. - Recheck A1c in three months. - Sent prescriptions to [facility] pharmacy for cost assistance.

## 2024-11-23 NOTE — Progress Notes (Signed)
 New Patient Office Visit  Subjective:  Patient ID: Cheryl Massey, female    DOB: [birth year]  Age: 68 y.o. MRN: [id number]  CC:  Chief Complaint  Patient presents with   Hospitalization Follow-up    11/18/24  Re-establish care     HPI     Discussed the use of AI scribe software for clinical note transcription with the patient, who gave verbal consent to proceed.  History of Present Illness Cheryl Massey is a 68 year old female with a history of CVA and diabetes who presents for follow-up after a fall resulting in a minimally displaced periprosthetic fracture. She is accompanied by her granddaughter.  She was hospitalized from December 5th to December 7th following a fall that resulted in a minimally displaced periprosthetic fracture confirmed by CT. No surgical intervention was required. She was discharged with recommendations for physical therapy, occupational therapy, and to bear 50% weight as tolerated using a rolling walker. Financial constraints have delayed her follow-up appointment with orthopedics, which was recommended in two weeks.  She has a history of diabetes and is currently taking glipizide  5 mg daily, metformin  1000 mg twice daily, and follows a low-carb diet. Her recent A1c was 9.1%. She uses a glucometer at home to monitor her blood sugar levels.  Glipizide  was recently added at the hospital  Takes losartan  25 mg daily for hypertension, has history of CVA and takes aspirin  81 mg daily, currently not on a statin   For pain management post-fall, she is prescribed Robaxin  500 mg every six hours as needed, oxycodone  5 mg every four hours as needed, and Tylenol  650 mg every six hours as needed.  She lives with her daughter and granddaughter, does not smoke, and has a history of minimal alcohol use. She was previously under the care of Michellel NP in 2023 but had a gap in care due to a trip to [place].   Assessment & Plan     Past  Medical History:  Diagnosis Date   Arthritis    hands   Diabetes mellitus without complication (HCC)    Hypertension    Stroke (HCC) 03/28/2019    Past Surgical History:  Procedure Laterality Date   ABDOMINAL HYSTERECTOMY     APPENDECTOMY     arthroplasty Right 2005   TOTAL HIP ARTHROPLASTY Left 03/11/2021   TOTAL HIP ARTHROPLASTY Left 03/11/2021   Procedure: LEFT TOTAL HIP ARTHROPLASTY ANTERIOR APPROACH;  Surgeon: Jerri Kay HERO, MD;  Location: [facility] OR;  Service: Orthopedics;  Laterality: Left;    Family History  Problem Relation Age of Onset   Hypertension Mother    Hypertension Father     Social History   Socioeconomic History   Marital status: Married    Spouse name: Not on file   Number of children: Not on file   Years of education: Not on file   Highest education level: Not on file  Occupational History   Not on file  Tobacco Use   Smoking status: Never   Smokeless tobacco: Never  Vaping Use   Vaping status: Never Used  Substance and Sexual Activity   Alcohol use: No   Drug use: No   Sexual activity: Yes    Birth control/protection: Surgical  Other Topics Concern   Not on file  Social History Narrative   Not on file   Social Drivers of Health   Financial Resource Strain: Not on file  Food Insecurity: No Food Insecurity (11/18/2024)  Hunger Vital Sign    Worried About Running Out of Food in the Last Year: Never true    Ran Out of Food in the Last Year: Never true  Transportation Needs: No Transportation Needs (11/18/2024)   PRAPARE - Administrator, Civil Service (Medical): No    Lack of Transportation (Non-Medical): No  Physical Activity: Not on file  Stress: Not on file  Social Connections: Socially Integrated (11/18/2024)   Social Connection and Isolation Panel    Frequency of Communication with Friends and Family: Twice a week    Frequency of Social Gatherings with Friends and Family: Twice a week    Attends Religious Services: 1 to 4  times per year    Active Member of [employer] or Organizations: No    Attends Engineer, Structural: 1 to 4 times per year    Marital Status: Married  Catering Manager Violence: Not At Risk (11/18/2024)   Humiliation, Afraid, Rape, and Kick questionnaire    Fear of Current or Ex-Partner: No    Emotionally Abused: No    Physically Abused: No    Sexually Abused: No    ROS Review of Systems  Constitutional:  Negative for appetite change, chills, fatigue and fever.  HENT:  Negative for congestion, postnasal drip, rhinorrhea and sneezing.   Respiratory:  Negative for cough, shortness of breath and wheezing.   Cardiovascular:  Negative for chest pain, palpitations and leg swelling.  Gastrointestinal:  Negative for abdominal pain, constipation, nausea and vomiting.  Genitourinary:  Negative for difficulty urinating, dysuria, flank pain and frequency.  Musculoskeletal:  Positive for arthralgias.  Skin:  Negative for color change, pallor, rash and wound.  Neurological:  Negative for dizziness, facial asymmetry, weakness, numbness and headaches.  Psychiatric/Behavioral:  Negative for behavioral problems, confusion, self-injury and suicidal ideas.     Objective:   Today's Vitals: BP 132/68   Pulse 63   Resp 16   Wt 141 lb (64 kg)   SpO2 100%   BMI 26.64 kg/m   Physical Exam Vitals and nursing note reviewed.  Constitutional:      General: She is not in acute distress.    Appearance: Normal appearance. She is not ill-appearing, toxic-appearing or diaphoretic.  Eyes:     General: No scleral icterus.       Right eye: No discharge.        Left eye: No discharge.     Extraocular Movements: Extraocular movements intact.     Conjunctiva/sclera: Conjunctivae normal.  Cardiovascular:     Rate and Rhythm: Normal rate and regular rhythm.     Pulses: Normal pulses.     Heart sounds: Normal heart sounds. No murmur heard.    No friction rub. No gallop.  Pulmonary:     Effort: Pulmonary  effort is normal. No respiratory distress.     Breath sounds: Normal breath sounds. No stridor. No wheezing, rhonchi or rales.  Chest:     Chest wall: No tenderness.  Abdominal:     General: There is no distension.     Palpations: Abdomen is soft.     Tenderness: There is no abdominal tenderness. There is no right CVA tenderness, left CVA tenderness or guarding.  Musculoskeletal:        General: Tenderness present. No swelling, deformity or signs of injury.     Right lower leg: No edema.     Left lower leg: No edema.     Comments: Right hip tenderness, skin is  warm and dry has palpable pedal pulse.  Using a walker for ambulation  Skin:    General: Skin is warm and dry.     Capillary Refill: Capillary refill takes less than 2 seconds.     Coloration: Skin is not jaundiced or pale.     Findings: No bruising, erythema or lesion.  Neurological:     Mental Status: She is alert and oriented to person, place, and time.     Motor: No weakness.     Gait: Gait abnormal.  Psychiatric:        Mood and Affect: Mood normal.        Behavior: Behavior normal.        Thought Content: Thought content normal.        Judgment: Judgment normal.     Assessment & Plan:   Problem List Items Addressed This Visit       Cardiovascular and Mediastinum   Essential hypertension (Chronic)   BP Readings from Last 3 Encounters:  11/23/24 132/68  11/20/24 (!) 143/84  08/05/22 (!) 149/80   Managed with losartan . Dietary changes emphasized for blood pressure control. - Continue losartan  25 mg daily. - Advised heart-healthy diet low in salt and fat.       Relevant Medications   losartan  (COZAAR ) 25 MG tablet   aspirin  EC 81 MG tablet     Endocrine   Type 2 diabetes mellitus treated without insulin  (HCC) (Chronic)    A1c of 9.1 indicates poor control. On metformin  and newly started glipizide . Dietary changes emphasized. - Continue metformin  1000 mg twice daily. - Continue glipizide  5 mg daily. -  Advised low carbohydrate diet, avoiding sweets, soda, bread, rice, and pasta. - Monitor blood glucose levels regularly and record readings. - Recheck A1c in three months. - Sent prescriptions to [facility] pharmacy for cost assistance.        Relevant Medications   glipiZIDE  (GLUCOTROL ) 5 MG tablet   losartan  (COZAAR ) 25 MG tablet   metFORMIN  (GLUCOPHAGE ) 1000 MG tablet   aspirin  EC 81 MG tablet   Other Relevant Orders   Direct LDL     Musculoskeletal and Integument   Periprosthetic fracture of shaft of femur - Primary   Right hip periprosthetic fracture Minimally displaced fracture confirmed by CT. No surgery needed. - Continue 50% weight bearing with rolling walker. - Follow up with orthopedics in two weeks. Continue oxycodone  5 mg every 4 hours as needed, Robaxin  500 mg every 6 hours as needed, Tylenol  650 mg every 6 hours as needed pain PT OT was ordered at discharge Fall prevention education completed         Other   History of CVA (cerebrovascular accident)   History of cerebrovascular accident No current cholesterol medication. Plan to assess cholesterol for cardiovascular risk. - Checked direct LDL - Continue aspirin  81 mg daily.      Hospital discharge follow-up   Hospital chart reviewed, including discharge summary Medications reconciled and reviewed with the patient in detail        Outpatient Encounter Medications as of 11/23/2024  Medication Sig   acetaminophen  (TYLENOL ) 325 MG tablet Take 2 tablets (650 mg total) by mouth every 6 (six) hours as needed for mild pain (pain score 1-3) or headache.   aspirin  EC 81 MG tablet Take 81 mg by mouth daily. Swallow whole.   methocarbamol  (ROBAXIN ) 500 MG tablet Take 1 tablet (500 mg total) by mouth every 6 (six) hours as needed for muscle spasms.  oxyCODONE  (OXY IR/ROXICODONE ) 5 MG immediate release tablet Take 1 tablet (5 mg total) by mouth every 4 (four) hours as needed for severe pain (pain score 7-10).    [DISCONTINUED] glipiZIDE  (GLUCOTROL ) 5 MG tablet Take 1 tablet (5 mg total) by mouth daily before breakfast.   [DISCONTINUED] losartan  (COZAAR ) 25 MG tablet Take 25 mg by mouth daily.   [DISCONTINUED] metFORMIN  (GLUCOPHAGE ) 1000 MG tablet Take 1 tablet (1,000 mg total) by mouth 2 (two) times daily with a meal.   glipiZIDE  (GLUCOTROL ) 5 MG tablet Take 1 tablet (5 mg total) by mouth daily before breakfast.   losartan  (COZAAR ) 25 MG tablet Take 1 tablet (25 mg total) by mouth daily.   metFORMIN  (GLUCOPHAGE ) 1000 MG tablet Take 1 tablet (1,000 mg total) by mouth 2 (two) times daily with a meal.   [DISCONTINUED] aspirin  EC 325 MG tablet Take 325 mg by mouth daily with breakfast.   [DISCONTINUED] glipiZIDE  (GLUCOTROL ) 5 MG tablet Take 1 tablet (5 mg total) by mouth daily before breakfast.   [DISCONTINUED] losartan  (COZAAR ) 25 MG tablet Take 1 tablet (25 mg total) by mouth daily.   [DISCONTINUED] metFORMIN  (GLUCOPHAGE ) 1000 MG tablet Take 1 tablet (1,000 mg total) by mouth 2 (two) times daily with a meal.   No facility-administered encounter medications on file as of 11/23/2024.    Follow-up: Return in about 6 weeks (around 01/04/2025) for DM.   Sindhu Nguyen R Aero Drummonds, FNP

## 2024-11-23 NOTE — Assessment & Plan Note (Signed)
 Hospital chart reviewed, including discharge summary Medications reconciled and reviewed with the patient in detail

## 2024-11-24 LAB — LDL CHOLESTEROL, DIRECT: LDL Direct: 103 mg/dL — ABNORMAL HIGH (ref 0–99)

## 2024-11-25 ENCOUNTER — Ambulatory Visit: Payer: Self-pay | Admitting: Nurse Practitioner

## 2024-11-25 ENCOUNTER — Other Ambulatory Visit (HOSPITAL_COMMUNITY): Payer: Self-pay

## 2024-11-25 DIAGNOSIS — Z8673 Personal history of transient ischemic attack (TIA), and cerebral infarction without residual deficits: Secondary | ICD-10-CM

## 2024-11-25 DIAGNOSIS — E785 Hyperlipidemia, unspecified: Secondary | ICD-10-CM

## 2024-11-25 MED ORDER — ATORVASTATIN CALCIUM 20 MG PO TABS
20.0000 mg | ORAL_TABLET | Freq: Every day | ORAL | 2 refills | Status: AC
  Filled 2024-11-25: qty 60, 60d supply, fill #0

## 2024-11-28 ENCOUNTER — Other Ambulatory Visit: Payer: Self-pay

## 2024-11-28 ENCOUNTER — Emergency Department (HOSPITAL_COMMUNITY): Payer: Self-pay

## 2024-11-28 ENCOUNTER — Encounter (HOSPITAL_COMMUNITY): Payer: Self-pay

## 2024-11-28 ENCOUNTER — Emergency Department (HOSPITAL_COMMUNITY)
Admission: EM | Admit: 2024-11-28 | Discharge: 2024-11-28 | Disposition: A | Discharge status: Home / Self Care | Payer: Self-pay | Attending: Emergency Medicine | Admitting: Emergency Medicine

## 2024-11-28 DIAGNOSIS — M25571 Pain in right ankle and joints of right foot: Secondary | ICD-10-CM

## 2024-11-28 DIAGNOSIS — M79671 Pain in right foot: Secondary | ICD-10-CM

## 2024-11-28 MED ORDER — HYDROMORPHONE HCL 1 MG/ML IJ SOLN
1.0000 mg | Freq: Once | INTRAMUSCULAR | Status: AC
  Administered 2024-11-28: 12:00:00 1 mg via INTRAMUSCULAR
  Filled 2024-11-28: qty 1

## 2024-11-28 MED ORDER — KETOROLAC TROMETHAMINE 15 MG/ML IJ SOLN
15.0000 mg | Freq: Once | INTRAMUSCULAR | Status: AC
  Administered 2024-11-28: 14:00:00 15 mg via INTRAMUSCULAR
  Filled 2024-11-28: qty 1

## 2024-11-28 MED ORDER — OXYCODONE-ACETAMINOPHEN 5-325 MG PO TABS
1.0000 | ORAL_TABLET | Freq: Once | ORAL | Status: AC
  Administered 2024-11-28: 14:00:00 1 via ORAL
  Filled 2024-11-28: qty 1

## 2024-11-28 NOTE — ED Provider Notes (Signed)
 [place] EMERGENCY DEPARTMENT AT [facility] Provider Note   CSN: [id number] Arrival date & time: 11/28/24  1014     Patient presents with: Foot Pain   Cheryl Massey is a 68 y.o. female.   The history is provided by the patient, a relative and medical records. The history is limited by a language barrier. A language interpreter was used.  Foot Pain This is a new problem. The current episode started 3 to 5 hours ago. The problem occurs constantly. The problem has not changed since onset.Pertinent negatives include no chest pain, no abdominal pain, no headaches and no shortness of breath. The symptoms are aggravated by standing and walking. Nothing relieves the symptoms. She has tried nothing for the symptoms. The treatment provided no relief.       Prior to Admission medications  Medication Sig Start Date End Date Taking? Authorizing Provider  acetaminophen  (TYLENOL ) 325 MG tablet Take 2 tablets (650 mg total) by mouth every 6 (six) hours as needed for mild pain (pain score 1-3) or headache. 11/20/24   Barbarann Nest, MD  aspirin  EC 81 MG tablet Take 81 mg by mouth daily. Swallow whole.    [provider]  atorvastatin  (LIPITOR) 20 MG tablet Take 1 tablet (20 mg total) by mouth daily. 11/25/24 11/25/25  Paseda, Folashade R, FNP  glipiZIDE  (GLUCOTROL ) 5 MG tablet Take 1 tablet (5 mg total) by mouth daily before breakfast. 11/23/24   Paseda, Folashade R, FNP  losartan  (COZAAR ) 25 MG tablet Take 1 tablet (25 mg total) by mouth daily. 11/23/24   Paseda, Folashade R, FNP  metFORMIN  (GLUCOPHAGE ) 1000 MG tablet Take 1 tablet (1,000 mg total) by mouth 2 (two) times daily with a meal. 11/23/24   Paseda, Folashade R, FNP  methocarbamol  (ROBAXIN ) 500 MG tablet Take 1 tablet (500 mg total) by mouth every 6 (six) hours as needed for muscle spasms. 11/20/24   Barbarann Nest, MD  oxyCODONE  (OXY IR/ROXICODONE ) 5 MG immediate release tablet Take 1 tablet (5 mg  total) by mouth every 4 (four) hours as needed for severe pain (pain score 7-10). 11/20/24   Barbarann Nest, MD    Allergies: Patient has no known allergies.    Review of Systems  Unable to perform ROS: Other (aphasia from prior stroke makes communication challenging)  Constitutional:  Negative for appetite change, diaphoresis, fatigue and fever.  HENT:  Negative for congestion.   Respiratory:  Negative for cough, chest tightness, shortness of breath and wheezing.   Cardiovascular:  Positive for leg swelling (possible mild foot swelling per pt). Negative for chest pain and palpitations.  Gastrointestinal:  Negative for abdominal pain, constipation, diarrhea, nausea and vomiting.  Genitourinary:  Negative for dysuria, flank pain and frequency.  Musculoskeletal:  Negative for back pain, neck pain and neck stiffness.  Skin:  Negative for rash and wound.  Neurological:  Positive for speech difficulty (at baseline). Negative for weakness, light-headedness, numbness and headaches.  Psychiatric/Behavioral:  Negative for agitation and confusion.   All other systems reviewed and are negative.   Updated Vital Signs BP 116/75 (BP Location: Left Arm)   Pulse 79   Temp 98.6 F (37 C) (Oral)   Resp 18   Ht 5' 1 (1.549 m)   Wt 68 kg   SpO2 100%   BMI 28.34 kg/m   Physical Exam Vitals and nursing note reviewed.  Constitutional:      General: She is not in acute distress.    Appearance: She  is well-developed. She is not ill-appearing, toxic-appearing or diaphoretic.  HENT:     Head: Normocephalic and atraumatic.     Nose: No congestion or rhinorrhea.     Mouth/Throat:     Pharynx: No oropharyngeal exudate or posterior oropharyngeal erythema.  Eyes:     Conjunctiva/sclera: Conjunctivae normal.     Pupils: Pupils are equal, round, and reactive to light.  Cardiovascular:     Rate and Rhythm: Normal rate and regular rhythm.     Heart sounds: No murmur heard. Pulmonary:     Effort:  Pulmonary effort is normal. No respiratory distress.     Breath sounds: Normal breath sounds. No wheezing, rhonchi or rales.  Chest:     Chest wall: No tenderness.  Abdominal:     General: Abdomen is flat.     Palpations: Abdomen is soft.     Tenderness: There is no abdominal tenderness. There is no guarding or rebound.  Musculoskeletal:        General: Tenderness present. No swelling (not clearly swollen on my exam but pt thinks foot might be).     Cervical back: Neck supple.     Right ankle: Tenderness present.     Right foot: Normal capillary refill. Tenderness present. No swelling, deformity or laceration. Normal pulse.       Legs:       Feet:  Skin:    General: Skin is warm and dry.     Capillary Refill: Capillary refill takes less than 2 seconds.     Findings: No erythema or rash.  Neurological:     General: No focal deficit present.     Mental Status: She is alert.     Sensory: No sensory deficit.     Motor: No weakness.  Psychiatric:        Mood and Affect: Mood normal.     (all labs ordered are listed, but only abnormal results are displayed) Labs Reviewed - No data to display  EKG: None  Radiology: DG Foot Complete Right Result Date: 11/28/2024 EXAM: 4 VIEW(S) XRAY OF THE RIGHT FOOT 11/28/2024 11:30:57 AM COMPARISON: Right ankle series today. CLINICAL HISTORY: 68 year old female with right foot and ankle pain, recent fall with hip fracture, swelling. Rule out occult fracture versus deep vein thrombosis. 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 No acute fracture. No malalignment. SOFT TISSUES: The soft tissues are unremarkable. IMPRESSION: 1. No acute fracture or dislocation identified about the right foot. 2. 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 Electronically signed by: Helayne Hurst MD 11/28/2024 11:55 AM EST RP Workstation: [id number]   DG Ankle Complete Right Result Date: 11/28/2024 EXAM: 3  VIEW(S) XRAY OF THE RIGHT ANKLE 11/28/2024 11:30:57 AM CLINICAL HISTORY: 68 year old female. Right foot and ankle pain, recent fall with hip fracture, swelling. Rule out occult fracture versus deep vein thrombosis. COMPARISON: None available. FINDINGS: BONES AND JOINTS: Advanced midfoot degeneration. No acute fracture. No malalignment. SOFT TISSUES: The soft tissues are unremarkable. IMPRESSION: 1. No acute fracture or dislocation identified about the right ankle. 2. Advanced midfoot degeneration. Electronically signed by: Helayne Hurst MD 11/28/2024 11:53 AM EST RP Workstation: [id number]     Procedures   Medications Ordered in the ED  ketorolac  (TORADOL ) 15 MG/ML injection 15 mg (has no administration in time range)  oxyCODONE -acetaminophen  (PERCOCET/ROXICET) 5-325 MG per tablet 1 tablet (has no administration in time range)  HYDROmorphone  (DILAUDID ) injection 1 mg (1 mg Intramuscular Given 11/28/24 1139)  Medical Decision Making Amount and/or Complexity of Data Reviewed Radiology: ordered.  Risk Prescription drug management.    Gabriella Woodhead Dwight is a 68 y.o. female with a past medical history significant for previous stroke with chronic aphasia, hypertension, diabetes, previous arthritis pains, and recent right periprosthetic hip fracture several weeks ago who presents with right foot pain today.  According to patient and family via Spanish interpreter, patient has done fairly well with managing her right periprosthetic hip fracture by using a walker since her injury 10 days ago.  She has not report any new falls but today started having pain in her right foot that is severe.  She reports no other trauma.  She reports no history of previous ankle or foot fracture or surgery.  She reports it feels slightly swollen but is not feeling numb or weak.  She is denying any worsened pain in her right hip or thigh.  Denies knee pain.  Denies calf pain.   Says it does not feel red and warm.  She otherwise denies fevers, chest pain, shortness breath, cough, or history of blood clots.  She has altered how she ambulates with physical therapy and using the walker.  On exam, lungs clear.  Chest nontender.  Back nontender.  Abdomen nontender.  Hip nontender.  Knee nontender.  Calf nontender.  Patient's right foot has tenderness on the dorsum plantar surface, and around the ankle on the medial and anterior side.  Distally she could wiggle her toes and had cap refill and sensation intact.  She had intact pulse.  It did not appear discolored.  We had a shared decision making conversation about management and agreed to get x-ray of the ankle and foot but also get a DVT ultrasound as she just had the recent periprosthetic fracture and has been less mobile.  Clinically I suspect that patient has changed her ambulation and gait with the fracture and walker use and this may have caused musculoskeletal pain in her foot as she is walking different than she is used to.  It also may be related to previous arthritic pains and the drastic environmental temperature changes with his cold foot moving and recently.  Due to the recent injury we will get an ultrasound to rule out DVT and get the x-rays.  Will give her a shot of IM pain medicine as she reports he takes oxycodone  at home already.  We agreed together to hold on extensive labs or other workup at this time as family agrees it does not appear infected.  Patient is more tender on the foot and less so on the ankle and it was not circumferential at the ankle so low suspicion for septic arthritis in the ankle.  Anticipate reassessment after IM medication x-rays and ultrasound to determine disposition.  Patient's x-rays did not show acute fracture or other acute abnormality   Other than degenerative changes.  She was feeling somewhat better after pain shot.  The tech reports no DVT on her ultrasound.  We had a shared  decision-making conversation and I do suspect that the change in her gait from her previous injury caused this nonbony foot pain.  We will place her in a cam walker to help protect her foot and have her follow-up with her orthopedics team.  We had a shared decision-making conversation with patient and family with an interpreter and agreed to hold on more extensive workup at this time.  Will give a Toradol  shot and her pain pill she normally  takes with oxycodone  and will get her ready to go for outpatient follow-up.  Patient agrees and no other questions or concerns.  Patient discharged in good condition.      Final diagnoses:  Right foot pain  Acute right ankle pain    ED Discharge Orders     None       Clinical Impression: 1. Right foot pain   2. Acute right ankle pain     Disposition: Discharge  Condition: Good  I have discussed the results, Dx and Tx plan with the pt(& family if present). He/she/they expressed understanding and agree(s) with the plan. Discharge instructions discussed at great length. Strict return precautions discussed and pt &/or family have verbalized understanding of the instructions. No further questions at time of discharge.    New Prescriptions   No medications on file    Follow Up: Paseda, Folashade R, FNP [address] [place], [place] [address] [phone]     your orthopedics team         Siris Hoos, Lonni PARAS, MD 11/28/24 [phone]

## 2024-11-28 NOTE — Progress Notes (Signed)
 Orthopedic Tech Progress Note Patient Details:  Cheryl Massey [facility] [birth year] [id number]  Ortho Devices Type of Ortho Device: CAM walker Ortho Device/Splint Interventions: Ordered, Adjustment, Application   Post Interventions Patient Tolerated: Well Instructions Provided: Care of device, Adjustment of device  Cheryl Massey A Wonda 11/28/2024, 2:49 PM

## 2024-11-28 NOTE — Progress Notes (Signed)
 VASCULAR LAB    Right lower extremity venous duplex has been performed.  See CV proc for preliminary results.  Gave verbal report to Dr. Ginger LIS, Audrielle Vankuren, RVT 11/28/2024, 12:02 PM

## 2024-11-28 NOTE — Discharge Instructions (Addendum)
 Your history, exam, and evaluation today seem consistent with musculoskeletal pain in your right foot and ankle due to how you likely changed your movement and gait from your recent hip injury.  The x-rays today did not show acute fracture and only showed some degenerative changes in the foot.  I suspect he may have tenderness and ligamentous pains from how you have changed how you ambulate as you have done the physical therapy.  Please use the cam walker and your walker at home and continue to see physical therapy.  Please rest and stay hydrated.  We had a shared decision making conversation and agreed to hold on more extensive lab and imaging workup today.  Please follow-up with your primary doctor as well.  Please continue your home medications to help with pain control and we gave you a shot of anti-inflammatory medication and pain pill today to.  If any symptoms change or worsen acutely, please return to the nearest emergency department.

## 2024-11-28 NOTE — ED Notes (Signed)
 Ortho Tech called for CAM boot.

## 2024-11-28 NOTE — ED Triage Notes (Signed)
 BIB EMS from home for right foot pain that started this AM. Took oxycodone  at 0700 with no relief. Hx of arthritis.

## 2024-12-03 ENCOUNTER — Emergency Department (HOSPITAL_COMMUNITY): Payer: Self-pay

## 2024-12-03 ENCOUNTER — Encounter (HOSPITAL_COMMUNITY): Payer: Self-pay

## 2024-12-03 ENCOUNTER — Emergency Department (HOSPITAL_COMMUNITY)
Admission: EM | Admit: 2024-12-03 | Discharge: 2024-12-04 | Disposition: A | Discharge status: Home / Self Care | Payer: Self-pay | Attending: Emergency Medicine | Admitting: Emergency Medicine

## 2024-12-03 ENCOUNTER — Other Ambulatory Visit: Payer: Self-pay

## 2024-12-03 DIAGNOSIS — Z96641 Presence of right artificial hip joint: Secondary | ICD-10-CM | POA: Insufficient documentation

## 2024-12-03 DIAGNOSIS — Z7984 Long term (current) use of oral hypoglycemic drugs: Secondary | ICD-10-CM | POA: Insufficient documentation

## 2024-12-03 DIAGNOSIS — I10 Essential (primary) hypertension: Secondary | ICD-10-CM | POA: Insufficient documentation

## 2024-12-03 DIAGNOSIS — S72101A Unspecified trochanteric fracture of right femur, initial encounter for closed fracture: Secondary | ICD-10-CM | POA: Insufficient documentation

## 2024-12-03 DIAGNOSIS — X58XXXA Exposure to other specified factors, initial encounter: Secondary | ICD-10-CM | POA: Insufficient documentation

## 2024-12-03 DIAGNOSIS — Z8673 Personal history of transient ischemic attack (TIA), and cerebral infarction without residual deficits: Secondary | ICD-10-CM | POA: Insufficient documentation

## 2024-12-03 DIAGNOSIS — Z7982 Long term (current) use of aspirin: Secondary | ICD-10-CM | POA: Insufficient documentation

## 2024-12-03 DIAGNOSIS — R748 Abnormal levels of other serum enzymes: Secondary | ICD-10-CM | POA: Insufficient documentation

## 2024-12-03 DIAGNOSIS — E119 Type 2 diabetes mellitus without complications: Secondary | ICD-10-CM | POA: Insufficient documentation

## 2024-12-03 LAB — COMPREHENSIVE METABOLIC PANEL WITH GFR
ALT: 10 U/L (ref 0–44)
AST: 18 U/L (ref 15–41)
Albumin: 4.4 g/dL (ref 3.5–5.0)
Alkaline Phosphatase: 212 U/L — ABNORMAL HIGH (ref 38–126)
Anion gap: 13 (ref 5–15)
BUN: 14 mg/dL (ref 8–23)
CO2: 23 mmol/L (ref 22–32)
Calcium: 10 mg/dL (ref 8.9–10.3)
Chloride: 102 mmol/L (ref 98–111)
Creatinine, Ser: 0.86 mg/dL (ref 0.44–1.00)
GFR, Estimated: >60 mL/min
Glucose, Bld: 162 mg/dL — ABNORMAL HIGH (ref 70–99)
Potassium: 3.9 mmol/L (ref 3.5–5.1)
Sodium: 138 mmol/L (ref 135–145)
Total Bilirubin: 0.4 mg/dL (ref 0.0–1.2)
Total Protein: 7.9 g/dL (ref 6.5–8.1)

## 2024-12-03 LAB — CBC WITH DIFFERENTIAL/PLATELET
Abs Immature Granulocytes: 0.03 K/uL (ref 0.00–0.07)
Basophils Absolute: 0 K/uL (ref 0.0–0.1)
Basophils Relative: 0 %
Eosinophils Absolute: 0.1 K/uL (ref 0.0–0.5)
Eosinophils Relative: 1 %
HCT: 35.2 % — ABNORMAL LOW (ref 36.0–46.0)
Hemoglobin: 12.2 g/dL (ref 12.0–15.0)
Immature Granulocytes: 0 %
Lymphocytes Relative: 14 %
Lymphs Abs: 1 K/uL (ref 0.7–4.0)
MCH: 29.9 pg (ref 26.0–34.0)
MCHC: 34.7 g/dL (ref 30.0–36.0)
MCV: 86.3 fL (ref 80.0–100.0)
Monocytes Absolute: 0.5 K/uL (ref 0.1–1.0)
Monocytes Relative: 6 %
Neutro Abs: 5.8 K/uL (ref 1.7–7.7)
Neutrophils Relative %: 79 %
Platelets: 377 K/uL (ref 150–400)
RBC: 4.08 MIL/uL (ref 3.87–5.11)
RDW: 12 % (ref 11.5–15.5)
WBC: 7.5 K/uL (ref 4.0–10.5)
nRBC: 0 % (ref 0.0–0.2)

## 2024-12-03 MED ORDER — MORPHINE SULFATE (PF) 4 MG/ML IV SOLN
4.0000 mg | Freq: Once | INTRAVENOUS | Status: AC
  Administered 2024-12-03: 4 mg via INTRAVENOUS
  Filled 2024-12-03: qty 1

## 2024-12-03 MED ORDER — ONDANSETRON HCL 4 MG/2ML IJ SOLN
4.0000 mg | Freq: Once | INTRAMUSCULAR | Status: AC
  Administered 2024-12-03: 4 mg via INTRAVENOUS
  Filled 2024-12-03: qty 2

## 2024-12-03 NOTE — ED Notes (Signed)
 Attempted IV without success x 1. Will obtain US  guided IV. Pt and family made aware via Spanish interpreter

## 2024-12-03 NOTE — ED Triage Notes (Signed)
 Pt BIB [facility] for sudden onset of right hip/femur pain that started approx 1 hour ago while lying. Pt is in obvious pain.

## 2024-12-03 NOTE — Discharge Instructions (Addendum)
 You are seen in the ER today for your hip pain.  Your imaging was reassuring, there is no worsening of your fracture today.  Please continue to use your walker and be weightbearing to only approximately 50% of your body weight until you follow-up with the orthopedist as previously discussed.  Use the prescribed pain medication as needed at home and return to the ER with any severe symptoms.

## 2024-12-03 NOTE — ED Provider Notes (Signed)
 " [place] EMERGENCY DEPARTMENT AT [facility] Provider Note   CSN: [id number] Arrival date & time: 12/03/24  2101     Patient presents with: Leg Pain   Cheryl Massey is a 68 y.o. female.  Past medical history significant for CVA, diabetes, hypertension presents today for sudden onset of right hip/femur pain that began approximately 1 hour ago while laying down.  Patient was seen 2 weeks ago for a periprosthetic fracture that was nondisplaced.  Patient was to have follow-up for reimaging with Dr. Sherida on 12/21.  Patient reports that the pain begins at her right greater trochanter and radiates down her leg.  Patient denies any new injury, numbness, weakness, any other complaints at this time.    Leg Pain      Prior to Admission medications  Medication Sig Start Date End Date Taking? Authorizing Provider  acetaminophen  (TYLENOL ) 325 MG tablet Take 2 tablets (650 mg total) by mouth every 6 (six) hours as needed for mild pain (pain score 1-3) or headache. 11/20/24   Barbarann Nest, MD  aspirin  EC 81 MG tablet Take 81 mg by mouth daily. Swallow whole.    [provider]  atorvastatin  (LIPITOR) 20 MG tablet Take 1 tablet (20 mg total) by mouth daily. 11/25/24 11/25/25  Paseda, Folashade R, FNP  glipiZIDE  (GLUCOTROL ) 5 MG tablet Take 1 tablet (5 mg total) by mouth daily before breakfast. 11/23/24   Paseda, Folashade R, FNP  losartan  (COZAAR ) 25 MG tablet Take 1 tablet (25 mg total) by mouth daily. 11/23/24   Paseda, Folashade R, FNP  metFORMIN  (GLUCOPHAGE ) 1000 MG tablet Take 1 tablet (1,000 mg total) by mouth 2 (two) times daily with a meal. 11/23/24   Paseda, Folashade R, FNP  methocarbamol  (ROBAXIN ) 500 MG tablet Take 1 tablet (500 mg total) by mouth every 6 (six) hours as needed for muscle spasms. 11/20/24   Barbarann Nest, MD  oxyCODONE  (OXY IR/ROXICODONE ) 5 MG immediate release tablet Take 1 tablet (5 mg total) by mouth every 4 (four) hours as needed  for severe pain (pain score 7-10). 11/20/24   Barbarann Nest, MD    Allergies: Patient has no known allergies.    Review of Systems  Musculoskeletal:  Positive for arthralgias.    Updated Vital Signs BP (!) 154/67 (BP Location: Right Arm)   Pulse (!) 108   Temp 99.1 F (37.3 C) (Oral)   Resp 20   SpO2 100%   Physical Exam Vitals and nursing note reviewed.  Constitutional:      General: She is not in acute distress.    Appearance: She is well-developed. She is not toxic-appearing.  HENT:     Head: Normocephalic and atraumatic.     Right Ear: External ear normal.     Left Ear: External ear normal.  Eyes:     Conjunctiva/sclera: Conjunctivae normal.  Cardiovascular:     Rate and Rhythm: Normal rate and regular rhythm.     Heart sounds: No murmur heard. Pulmonary:     Effort: Pulmonary effort is normal. No respiratory distress.     Breath sounds: Normal breath sounds.  Abdominal:     Palpations: Abdomen is soft.     Tenderness: There is no abdominal tenderness.  Musculoskeletal:        General: Tenderness present. No swelling or deformity.     Cervical back: Neck supple.     Comments: Patient has mild tenderness to palpation of the right greater trochanter without deformity, ecchymosis,  warmth, or erythema.  Patient is neurovascularly intact with +2 dorsalis pedis pulses bilaterally.  Patient has profound pain with movement of her right leg.  Skin:    General: Skin is warm and dry.     Capillary Refill: Capillary refill takes less than 2 seconds.  Neurological:     Mental Status: She is alert.  Psychiatric:        Mood and Affect: Mood normal.     (all labs ordered are listed, but only abnormal results are displayed) Labs Reviewed  CBC WITH DIFFERENTIAL/PLATELET - Abnormal; Notable for the following components:      Result Value   HCT 35.2 (*)    All other components within normal limits  COMPREHENSIVE METABOLIC PANEL WITH GFR - Abnormal; Notable for the following  components:   Glucose, Bld 162 (*)    Alkaline Phosphatase 212 (*)    All other components within normal limits    EKG: None  Radiology: CT FEMUR RIGHT WO CONTRAST Result Date: 12/03/2024 EXAM: CT OF THE RIGHT FEMUR, WITHOUT IV CONTRAST 12/03/2024 11:05:38 PM TECHNIQUE: Axial images were acquired through the right femur without IV contrast. Reformatted images were reviewed. Automated exposure control, iterative reconstruction, and/or weight based adjustment of the mA/kV was utilized to reduce the radiation dose to as low as reasonably achievable. COMPARISON: 5/25. CLINICAL HISTORY: Fracture, femur. FINDINGS: BONES: Redemonstrated minimally displaced greater trochanter fracture of the right femur. JOINTS: Right hip arthroplasty. 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 Trace knee joint effusion. Degenerative arthritis of the knee. SOFT TISSUES: The soft tissues are unremarkable. IMPRESSION: 1. Minimally displaced greater trochanter fracture of the right femur. 2. 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 Electronically signed by: Norman Gatlin MD 12/03/2024 11:18 PM EST RP Workstation: [id number]   CT Hip Right Wo Contrast Result Date: 12/03/2024 EXAM: CT OF THE RIGHT HIP WITHOUT IV CONTRAST 12/03/2024 11:05:38 PM TECHNIQUE: CT of the right hip was performed without the administration of intravenous contrast. Multiplanar reformatted images are provided for review. Automated exposure control, iterative reconstruction, and/or weight based adjustment of the mA/kV was utilized to reduce the radiation dose to as low as reasonably achievable. COMPARISON: Comparison with CT 11/18/2024. CLINICAL HISTORY: Hip dislocation suspected. FINDINGS: BONES: Right hip arthroplasty is present. No dislocation. Redemonstrated mildly displaced periprosthetic fracture of the greater trochanter extending into the lateral subtrochanteric region. This is unchanged from 11/18/2024.  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 SOFT TISSUE: No significant soft tissue edema or fluid collections. No soft tissue mass. JOINT: Right hip arthroplasty is present. No significant degenerative changes. No osseous erosions. INTRAPELVIC CONTENTS: Limited images of the intrapelvic contents are unremarkable. IMPRESSION: 1. Unchanged displaced periprosthetic fracture of the greater trochanter. 2. 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 Electronically signed by: Norman Gatlin MD 12/03/2024 11:15 PM EST RP Workstation: [id number]     Procedures   Medications Ordered in the ED  morphine  (PF) 4 MG/ML injection 4 mg (4 mg Intravenous Given 12/03/24 2210)  ondansetron  (ZOFRAN ) injection 4 mg (4 mg Intravenous Given 12/03/24 2210)                                    Medical Decision Making Amount and/or Complexity of Data Reviewed Labs: ordered. Radiology: ordered.  Risk Prescription drug management.   lalThis patient presents to the ED for concern of right leg pain differential diagnosis  includes fracture, dislocation, sciatica,    Additional history obtained   Additional history obtained from Electronic Medical Record External records from outside source obtained and reviewed including recent admission documents   Lab Tests:  I Ordered, and personally interpreted labs.  The pertinent results include: CBC unremarkable, elevated alk phos at 212   Imaging Studies ordered:  I ordered imaging studies including CT right hip and CT right femur Noncon I independently visualized and interpreted imaging which showed minimally displaced greater trochanter fracture of the right femur.  Right hip arthroplasty with chronic protrusion of the acetabular cup component. I agree with the radiologist interpretation   Medicines ordered and prescription drug management:  I ordered medication including morphine  and Zofran     I have reviewed the patients  home medicines and have made adjustments as needed  Patient signed out to Pleasant Eng, PA-C pending trial of ambulation which will determine position.     Final diagnoses:  Closed fracture of trochanter of right femur, initial [facility]    ED Discharge Orders     None          Francis Ileana SAILOR, PA-C 12/04/24 0006    Francesca Elsie CROME, MD 12/04/24 1504  "

## 2024-12-03 NOTE — ED Provider Notes (Incomplete)
 " [place] EMERGENCY DEPARTMENT AT [facility] Provider Note   CSN: [id number] Arrival date & time: 12/03/24  2101     Patient presents with: Leg Pain   Cheryl Massey is a 68 y.o. female.  Past medical history significant for CVA, diabetes, hypertension presents today for sudden onset of right hip/femur pain that began approximately 1 hour ago while laying down.  Patient was seen 2 weeks ago for a periprosthetic fracture that was nondisplaced.  Patient was to have follow-up for reimaging with Dr. Sherida on 12/21.  Patient reports that the pain begins at her right greater trochanter and radiates down her leg.  Patient denies any new injury, numbness, weakness, any other complaints at this time.  {Add pertinent medical, surgical, social history, OB history to HPI:32947}  Leg Pain      Prior to Admission medications  Medication Sig Start Date End Date Taking? Authorizing Provider  acetaminophen  (TYLENOL ) 325 MG tablet Take 2 tablets (650 mg total) by mouth every 6 (six) hours as needed for mild pain (pain score 1-3) or headache. 11/20/24   Barbarann Nest, MD  aspirin  EC 81 MG tablet Take 81 mg by mouth daily. Swallow whole.    [provider]  atorvastatin  (LIPITOR) 20 MG tablet Take 1 tablet (20 mg total) by mouth daily. 11/25/24 11/25/25  Paseda, Folashade R, FNP  glipiZIDE  (GLUCOTROL ) 5 MG tablet Take 1 tablet (5 mg total) by mouth daily before breakfast. 11/23/24   Paseda, Folashade R, FNP  losartan  (COZAAR ) 25 MG tablet Take 1 tablet (25 mg total) by mouth daily. 11/23/24   Paseda, Folashade R, FNP  metFORMIN  (GLUCOPHAGE ) 1000 MG tablet Take 1 tablet (1,000 mg total) by mouth 2 (two) times daily with a meal. 11/23/24   Paseda, Folashade R, FNP  methocarbamol  (ROBAXIN ) 500 MG tablet Take 1 tablet (500 mg total) by mouth every 6 (six) hours as needed for muscle spasms. 11/20/24   Barbarann Nest, MD  oxyCODONE  (OXY IR/ROXICODONE ) 5 MG immediate release  tablet Take 1 tablet (5 mg total) by mouth every 4 (four) hours as needed for severe pain (pain score 7-10). 11/20/24   Barbarann Nest, MD    Allergies: Patient has no known allergies.    Review of Systems  Musculoskeletal:  Positive for arthralgias.    Updated Vital Signs BP (!) 154/67 (BP Location: Right Arm)   Pulse (!) 108   Temp 99.1 F (37.3 C) (Oral)   Resp 20   SpO2 100%   Physical Exam Vitals and nursing note reviewed.  Constitutional:      General: She is not in acute distress.    Appearance: She is well-developed. She is not toxic-appearing.  HENT:     Head: Normocephalic and atraumatic.     Right Ear: External ear normal.     Left Ear: External ear normal.  Eyes:     Conjunctiva/sclera: Conjunctivae normal.  Cardiovascular:     Rate and Rhythm: Normal rate and regular rhythm.     Heart sounds: No murmur heard. Pulmonary:     Effort: Pulmonary effort is normal. No respiratory distress.     Breath sounds: Normal breath sounds.  Abdominal:     Palpations: Abdomen is soft.     Tenderness: There is no abdominal tenderness.  Musculoskeletal:        General: Tenderness present. No swelling or deformity.     Cervical back: Neck supple.     Comments: Patient has mild tenderness to  palpation of the right greater trochanter without deformity, ecchymosis, warmth, or erythema.  Patient is neurovascularly intact with +2 dorsalis pedis pulses bilaterally.  Patient has profound pain with movement of her right leg.  Skin:    General: Skin is warm and dry.     Capillary Refill: Capillary refill takes less than 2 seconds.  Neurological:     Mental Status: She is alert.  Psychiatric:        Mood and Affect: Mood normal.     (all labs ordered are listed, but only abnormal results are displayed) Labs Reviewed  CBC WITH DIFFERENTIAL/PLATELET - Abnormal; Notable for the following components:      Result Value   HCT 35.2 (*)    All other components within normal limits   COMPREHENSIVE METABOLIC PANEL WITH GFR - Abnormal; Notable for the following components:   Glucose, Bld 162 (*)    Alkaline Phosphatase 212 (*)    All other components within normal limits    EKG: None  Radiology: CT Hip Right Wo Contrast Result Date: 12/03/2024 EXAM: CT OF THE RIGHT HIP WITHOUT IV CONTRAST 12/03/2024 11:05:38 PM TECHNIQUE: CT of the right hip was performed without the administration of intravenous contrast. Multiplanar reformatted images are provided for review. Automated exposure control, iterative reconstruction, and/or weight based adjustment of the mA/kV was utilized to reduce the radiation dose to as low as reasonably achievable. COMPARISON: Comparison with CT 11/18/2024. CLINICAL HISTORY: Hip dislocation suspected. FINDINGS: BONES: Right hip arthroplasty is present. No dislocation. Redemonstrated mildly displaced periprosthetic fracture of the greater trochanter extending into the lateral subtrochanteric region. This is unchanged from 11/18/2024. 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 SOFT TISSUE: No significant soft tissue edema or fluid collections. No soft tissue mass. JOINT: Right hip arthroplasty is present. No significant degenerative changes. No osseous erosions. INTRAPELVIC CONTENTS: Limited images of the intrapelvic contents are unremarkable. IMPRESSION: 1. Unchanged displaced periprosthetic fracture of the greater trochanter. 2. 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 Electronically signed by: Norman Gatlin MD 12/03/2024 11:15 PM EST RP Workstation: [id number]    {Document cardiac monitor, telemetry assessment procedure when appropriate:32947} Procedures   Medications Ordered in the ED  morphine  (PF) 4 MG/ML injection 4 mg (4 mg Intravenous Given 12/03/24 2210)  ondansetron  (ZOFRAN ) injection 4 mg (4 mg Intravenous Given 12/03/24 2210)      {Click here for ABCD2, HEART and other calculators REFRESH Note before  signing:1}                              Medical Decision Making Amount and/or Complexity of Data Reviewed Labs: ordered. Radiology: ordered.  Risk Prescription drug management.   lalThis patient presents to the ED for concern of right leg pain differential diagnosis includes fracture, dislocation, sciatica,    Additional history obtained   Additional history obtained from Electronic Medical Record External records from outside source obtained and reviewed including recent admission documents   Lab Tests:  I Ordered, and personally interpreted labs.  The pertinent results include: CBC unremarkable, elevated alk phos at 212   Imaging Studies ordered:  I ordered imaging studies including CT right hip and CT right femur Noncon I independently visualized and interpreted imaging which showed minimally displaced greater trochanter fracture of the right femur.  Right hip arthroplasty with chronic protrusion of the acetabular cup component. I agree with the radiologist interpretation   Medicines ordered and prescription drug management:  I ordered medication including morphine  and Zofran     I have reviewed the patients home medicines and have made adjustments as needed  Patient signed out to Pleasant Eng, PA-C   {Document critical care time when appropriate  Document review of labs and clinical decision tools ie CHADS2VASC2, etc  Document your independent review of radiology images and any outside records  Document your discussion with family members, caretakers and with consultants  Document social determinants of health affecting pt's care  Document your decision making why or why not admission, treatments were needed:32947:::1}   Final diagnoses:  None    ED Discharge Orders     None        "

## 2024-12-04 MED ORDER — MORPHINE SULFATE (PF) 4 MG/ML IV SOLN
4.0000 mg | Freq: Once | INTRAVENOUS | Status: AC
  Administered 2024-12-04: 4 mg via INTRAVENOUS
  Filled 2024-12-04: qty 1

## 2024-12-04 MED ORDER — OXYCODONE-ACETAMINOPHEN 5-325 MG PO TABS: 1.0000 | ORAL_TABLET | Freq: Four times a day (QID) | ORAL | 0 refills | Status: AC | PRN

## 2024-12-04 NOTE — ED Notes (Signed)
 PT did noticeably well with ambulating in hallway with walker. She stated that she felt fine and was able to walk with it.

## 2024-12-04 NOTE — ED Notes (Signed)
 PT expressed via translator that she is in too much pain and does not want to try to get up to ambulate at the moment; RN & provider notified.

## 2024-12-04 NOTE — ED Provider Notes (Signed)
" °  Physical Exam  BP (!) 154/67 (BP Location: Right Arm)   Pulse (!) 108   Temp 99.1 F (37.3 C) (Oral)   Resp 20   SpO2 100%   Physical Exam  Procedures  Procedures  ED Course / MDM   Clinical Course as of 12/04/24 0301  Sun Dec 04, 2024  0243 Patient ambulated well with walker in the emergency department, feels comfortable being discharged at this time. [RS]    Clinical Course User Index [RS] Sanari Offner, Pleasant SAUNDERS, PA-C   Medical Decision Making Amount and/or Complexity of Data Reviewed Labs: ordered. Radiology: ordered.  Risk Prescription drug management.   Care of this patient assumed from preceding ED provider Ileana Eck, PA-C at time of shift change.  Please see her associated note for further intended patient's ED course.  In brief patient has a known periprosthetic right greater trochanteric fracture, 77 days old.  Plan  by initial ortho consult Dr. Sherida PWB 50% with a rolling walker with no active hip abduction, with walker and outpatient follow-up at the 2-week mark.  She presented today with concern for acute worsening of her pain, improved after morphine .  Repeat CT today unchanged from that at time of initial injury.  At time of shift change pending repeat ambulation and likely discharge with outpatient Ortho follow-up.  Patient required additional dose of analgesia in the ED, however was subsequently able to ambulate without difficulty with partial weightbearing with her walker.  She feels comfortable being discharged at this time.  Mikiya voiced understanding of her medical evaluation and treatment plan. Each of their questions answered to their expressed satisfaction.  Return precautions were given.  Patient is well-appearing, stable, and was discharged in good condition.  This chart was dictated using voice recognition software, Dragon. Despite the best efforts of this provider to proofread and correct errors, errors may still occur which can change documentation  meaning.        Bobette Pleasant SAUNDERS, PA-C 12/04/24 0301    Bernard Drivers, MD 12/10/24 1413  "

## 2024-12-04 NOTE — ED Notes (Signed)
 Spanish interpreter used for discharge teaching.   Discharge instructions reviewed.   Opportunity for questions and concerns provided.   Alert, oriented and ambulatory.   Displays no signs of distress.   Encouraged to continue taking prescribed medication and follow up with Orthopedist.

## 2024-12-04 NOTE — ED Notes (Signed)
 Pt and family made aware of  plan to ambulate after pain meds administered. Verbal understanding expressed. Pt states that she is scared. She is notified that 2 staff members will be at bedside, as well as, a walker for use.

## 2024-12-07 ENCOUNTER — Other Ambulatory Visit (HOSPITAL_COMMUNITY): Payer: Self-pay

## 2024-12-28 ENCOUNTER — Other Ambulatory Visit (INDEPENDENT_AMBULATORY_CARE_PROVIDER_SITE_OTHER): Payer: Self-pay

## 2024-12-28 ENCOUNTER — Ambulatory Visit: Payer: Self-pay | Admitting: Physician Assistant

## 2024-12-28 DIAGNOSIS — S7291XK Unspecified fracture of right femur, subsequent encounter for closed fracture with nonunion: Secondary | ICD-10-CM

## 2024-12-28 MED ORDER — TRAMADOL HCL 50 MG PO TABS: 50.0000 mg | ORAL_TABLET | Freq: Two times a day (BID) | ORAL | 1 refills | Status: AC | PRN

## 2024-12-28 NOTE — Addendum Note (Signed)
 Addended by: JULE MORNA CROME on: 12/28/2024 10:59 AM   Modules accepted: Orders

## 2024-12-28 NOTE — Progress Notes (Signed)
 "  Office Visit Note   Patient: Cheryl Massey           Date of Birth: Jan 26, 1956           MRN: [id number] Visit Date: 12/28/2024              Requested by: Juanice Thomes SAUNDERS, FNP [phone] [address] [place],  [place] [address] PCP: Paseda, Folashade R, FNP   Assessment & Plan: Visit Diagnoses:  1. 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  Patient is clinically doing much better.  Radiographically she is healing.  She may advance to weightbearing as tolerated.  Would like for her to avoid hip abduction for another 6 weeks.  Follow-up in 6 weeks for repeat evaluation x-rays of the hip.  Call with concerns or questions in meantime.  Follow-Up Instructions: Return in about 6 weeks (around 02/08/2025).   Orders:  Orders Placed This Encounter  Procedures   XR FEMUR, MIN 2 VIEWS RIGHT   No orders of the defined types were placed in this encounter.     Procedures: No procedures performed   Clinical Data: No additional findings.   Subjective: Chief Complaint  Patient presents with   Right Leg - Fracture    HPI patient  is a very pleasant 69 year old Spanish-speaking female here today with interpreter.  She sustained a mechanical fall on 11/18/2024 landing on her right hip.  She was seen in the ED where x-rays demonstrated periprosthetic greater troches femur fracture.  Dr. Lizette was consulted.  Unfortunately, patient was unable to follow-up at his office as they did not accept her insurance.  She is here today 6 weeks out from injury.  She notes that her pain is much better.  When she does have discomfort it is primarily to the right lateral hip with radiation into the knee.  Currently not taking any medication other than Tylenol  and Advil  for this.  She has been ambulating  with a walker due to an unsteady gait.  Review of Systems as detailed in HPI.  All others viewed and are negative.   Objective: Vital Signs: There were no vitals taken for this visit.  Physical Exam well-developed well-nourished female in no acute distress.  Alert and oriented x 3.  Ortho Exam left hip exam: She has very minimal tenderness to the greater trochanter.  Painless hip flexion.  She is neurovascularly intact distally.  Specialty Comments:  No specialty comments available.  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   Primary osteoarthritis of left knee 04/24/2021   Status post total replacement of left hip 03/11/2021   Primary osteoarthritis of left hip 10/05/2020   CVA (cerebral vascular accident) (HCC) 10/11/2019   CVA (cerebrovascular accident) (HCC) 10/11/2019   TIA (transient ischemic attack) 03/28/2019   Essential hypertension 03/28/2019   Type 2  diabetes mellitus treated without insulin  (HCC) 03/28/2019   Past Medical History:  Diagnosis Date   Arthritis    hands   Diabetes mellitus without complication (HCC)    Hypertension    Stroke (HCC) 03/28/2019    Family History  Problem Relation Age of Onset   Hypertension Mother    Hypertension Father     Past Surgical History:  Procedure Laterality Date   ABDOMINAL HYSTERECTOMY     APPENDECTOMY     arthroplasty Right 2005   TOTAL HIP ARTHROPLASTY Left 03/11/2021   TOTAL HIP  ARTHROPLASTY Left 03/11/2021   Procedure: LEFT TOTAL HIP ARTHROPLASTY ANTERIOR APPROACH;  Surgeon: Jerri Kay HERO, MD;  Location: [facility] OR;  Service: Orthopedics;  Laterality: Left;   Social History   Occupational History   Not on file  Tobacco Use   Smoking status: Never   Smokeless tobacco: Never  Vaping Use   Vaping status: Never Used  Substance and Sexual Activity   Alcohol use: No   Drug use: No   Sexual activity: Yes    Birth control/protection: Surgical        "

## 2025-01-04 ENCOUNTER — Ambulatory Visit: Payer: Self-pay | Admitting: Nurse Practitioner

## 2025-01-05 ENCOUNTER — Encounter: Payer: Self-pay | Admitting: Nurse Practitioner

## 2025-02-08 ENCOUNTER — Ambulatory Visit: Payer: Self-pay | Admitting: Physician Assistant
